# Patient Record
Sex: Female | Born: 1980 | Race: Black or African American | Hispanic: No | Marital: Married | Smoking: Former smoker
Health system: Southern US, Community
[De-identification: ages and names within clinical notes are randomized; demographics above are authoritative.]

## PROBLEM LIST (undated history)

## (undated) DIAGNOSIS — J40 Bronchitis, not specified as acute or chronic: Secondary | ICD-10-CM

## (undated) DIAGNOSIS — K219 Gastro-esophageal reflux disease without esophagitis: Secondary | ICD-10-CM

## (undated) DIAGNOSIS — E669 Obesity, unspecified: Secondary | ICD-10-CM

## (undated) DIAGNOSIS — N63 Unspecified lump in unspecified breast: Secondary | ICD-10-CM

## (undated) DIAGNOSIS — I10 Essential (primary) hypertension: Secondary | ICD-10-CM

## (undated) HISTORY — DX: Unspecified lump in unspecified breast: N63.0

## (undated) HISTORY — DX: Obesity, unspecified: E66.9

---

## 2004-08-27 ENCOUNTER — Emergency Department: Payer: Self-pay | Admitting: Emergency Medicine

## 2006-02-17 ENCOUNTER — Observation Stay: Payer: Self-pay | Admitting: Unknown Physician Specialty

## 2006-03-10 ENCOUNTER — Observation Stay: Payer: Self-pay

## 2006-03-18 ENCOUNTER — Observation Stay: Payer: Self-pay | Admitting: Obstetrics & Gynecology

## 2006-03-19 ENCOUNTER — Inpatient Hospital Stay: Payer: Self-pay

## 2008-07-24 ENCOUNTER — Emergency Department: Payer: Self-pay | Admitting: Emergency Medicine

## 2008-09-21 ENCOUNTER — Emergency Department: Payer: Self-pay | Admitting: Emergency Medicine

## 2008-10-23 ENCOUNTER — Emergency Department: Payer: Self-pay | Admitting: Emergency Medicine

## 2009-02-09 ENCOUNTER — Observation Stay: Payer: Self-pay | Admitting: Unknown Physician Specialty

## 2009-02-14 ENCOUNTER — Observation Stay: Payer: Self-pay | Admitting: Obstetrics & Gynecology

## 2009-02-20 ENCOUNTER — Inpatient Hospital Stay: Payer: Self-pay

## 2010-01-25 ENCOUNTER — Emergency Department: Payer: Self-pay | Admitting: Emergency Medicine

## 2011-01-05 ENCOUNTER — Emergency Department: Payer: Self-pay | Admitting: Emergency Medicine

## 2013-06-30 ENCOUNTER — Emergency Department: Payer: Self-pay | Admitting: Emergency Medicine

## 2013-06-30 LAB — URINALYSIS, COMPLETE
Bilirubin,UR: NEGATIVE
Blood: NEGATIVE
Nitrite: NEGATIVE
Ph: 7 (ref 4.5–8.0)
RBC,UR: 4 /HPF (ref 0–5)
Squamous Epithelial: 7

## 2013-06-30 LAB — RAPID INFLUENZA A&B ANTIGENS

## 2015-06-13 ENCOUNTER — Ambulatory Visit: Payer: Self-pay | Admitting: Podiatry

## 2016-06-22 ENCOUNTER — Encounter: Payer: Self-pay | Admitting: Surgery

## 2016-06-22 ENCOUNTER — Ambulatory Visit (INDEPENDENT_AMBULATORY_CARE_PROVIDER_SITE_OTHER): Payer: 59 | Admitting: Surgery

## 2016-06-22 VITALS — BP 153/93 | HR 87 | Temp 99.1°F | Ht 67.0 in | Wt 240.0 lb

## 2016-06-22 DIAGNOSIS — N63 Unspecified lump in unspecified breast: Secondary | ICD-10-CM | POA: Diagnosis not present

## 2016-06-22 DIAGNOSIS — N644 Mastodynia: Secondary | ICD-10-CM | POA: Diagnosis not present

## 2016-06-22 NOTE — Progress Notes (Signed)
Surgical Consultation  06/22/2016  Danielle Romero is an 35 y.o. female.   CC: Left Breast lump  HPI: This a patient with a painful left breast lump. She does not do regular self exams but believes it is been there over a year and has been increasing in size. It has never drained but she has had some expressible milky fluid from one of her ducts on her left breast. She has never had a biopsy before and has no other problems. Of note however her mother has breast cancer and is being treated currently  GYN History: G4 P4 Ab0  Family Breast Cancer History: Mother  No past medical history past surgical history includes a C-section only.  No past medical history on file.  No past surgical history on file.  No family history on file.  Social History:  has no tobacco, alcohol, and drug history on file. She works at [employer] smokes 4 cigarettes per day and drinks alcohol on weekends  Allergies: Not on File  Medications reviewed.   Review of Systems:   Review of Systems  Constitutional: Negative.   HENT: Negative.   Eyes: Negative.   Respiratory: Negative.   Cardiovascular: Negative.   Gastrointestinal: Negative.   Genitourinary: Negative.   Musculoskeletal: Negative.   Skin: Negative.   Neurological: Negative.   Endo/Heme/Allergies: Negative.   Psychiatric/Behavioral: Negative.      Physical Exam:  There were no vitals taken for this visit.  Physical Exam  Constitutional: She is oriented to person, place, and time and well-developed, well-nourished, and in no distress. No distress.  HENT:  Head: Normocephalic and atraumatic.  Eyes: Pupils are equal, round, and reactive to light. Right eye exhibits no discharge. Left eye exhibits no discharge. No scleral icterus.  Neck: Normal range of motion.  Cardiovascular: Normal rate, regular rhythm and normal heart sounds.   Pulmonary/Chest: Effort normal and breath sounds normal. No respiratory distress. She has no  wheezes. She has no rales. Right breast exhibits no inverted nipple, no mass, no nipple discharge, no skin change and no tenderness. Left breast exhibits mass. Left breast exhibits no inverted nipple, no nipple discharge, no skin change and no tenderness.  Abdominal: Soft. There is no tenderness.  Musculoskeletal: Normal range of motion. She exhibits no edema.  Lymphadenopathy:    She has no cervical adenopathy.  Neurological: She is alert and oriented to person, place, and time.  Skin: Skin is warm and dry. No erythema.  Psychiatric: Mood and affect normal.  Vitals reviewed.   Breast Exam: No mass in the right breast no axillary adenopathy  Left breast with 3 mm raised whitish colored mass at the 3:00 position of the areola within the areola. It is nontender nonerythematous no expressible material. No expressible nipple discharge. No other masses and no axillary adenopathy.    No results found for this or any previous visit (from the past 48 hour(s)). No results found.  Pathology: None  Assessment/Plan:  Patient is not had a mammogram. We'll obtain a mammogram especially with her family history of breast cancer. 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 Once the mammogram was complete and there are no other concerns I would recommend excisional biopsy which could be performed her surgery center under local anesthetic. The rationale for this approach is been discussed she understood and agreed to proceed we'll see her back after the mammogram  Florene Glen, MD, FACS

## 2016-06-22 NOTE — Patient Instructions (Addendum)
Your mammogram and ultrasound appointment will be on 07/02/2016 at 1:40 PM at [facility].  We will see you back in th office afterwards to discuss results.

## 2016-06-24 ENCOUNTER — Telehealth: Payer: Self-pay

## 2016-06-24 NOTE — Addendum Note (Signed)
Addended by: Wayna Chalet on: 06/24/2016 09:53 AM   Modules accepted: Orders

## 2016-06-24 NOTE — Telephone Encounter (Signed)
Called patient and had to leave a voicemail to return my call.  Patient has an appointment at [facility] on 07/02/2016 at 1:40 PM. If this time doesn't work for her, she could call [phone].

## 2016-07-02 ENCOUNTER — Ambulatory Visit
Admission: RE | Admit: 2016-07-02 | Discharge: 2016-07-02 | Disposition: A | Discharge status: Home / Self Care | Payer: 59 | Source: Ambulatory Visit | Attending: Surgery | Admitting: Surgery

## 2016-07-02 DIAGNOSIS — R921 Mammographic calcification found on diagnostic imaging of breast: Secondary | ICD-10-CM | POA: Insufficient documentation

## 2016-07-02 DIAGNOSIS — N6002 Solitary cyst of left breast: Secondary | ICD-10-CM | POA: Insufficient documentation

## 2016-07-02 DIAGNOSIS — N6320 Unspecified lump in the left breast, unspecified quadrant: Secondary | ICD-10-CM | POA: Insufficient documentation

## 2016-07-02 DIAGNOSIS — N644 Mastodynia: Secondary | ICD-10-CM | POA: Diagnosis present

## 2016-07-02 DIAGNOSIS — N63 Unspecified lump in unspecified breast: Secondary | ICD-10-CM

## 2016-07-02 DIAGNOSIS — N6310 Unspecified lump in the right breast, unspecified quadrant: Secondary | ICD-10-CM | POA: Insufficient documentation

## 2016-07-07 ENCOUNTER — Other Ambulatory Visit: Payer: Self-pay | Admitting: Surgery

## 2016-07-07 DIAGNOSIS — R928 Other abnormal and inconclusive findings on diagnostic imaging of breast: Secondary | ICD-10-CM

## 2016-07-07 DIAGNOSIS — R921 Mammographic calcification found on diagnostic imaging of breast: Secondary | ICD-10-CM

## 2016-07-22 ENCOUNTER — Ambulatory Visit
Admission: RE | Admit: 2016-07-22 | Discharge: 2016-07-22 | Disposition: A | Discharge status: Home / Self Care | Payer: 59 | Source: Ambulatory Visit | Attending: Surgery | Admitting: Surgery

## 2016-07-22 DIAGNOSIS — R921 Mammographic calcification found on diagnostic imaging of breast: Secondary | ICD-10-CM | POA: Diagnosis not present

## 2016-07-22 DIAGNOSIS — R928 Other abnormal and inconclusive findings on diagnostic imaging of breast: Secondary | ICD-10-CM | POA: Diagnosis present

## 2016-07-23 LAB — SURGICAL PATHOLOGY

## 2016-07-24 ENCOUNTER — Telehealth: Payer: Self-pay

## 2016-07-24 NOTE — Telephone Encounter (Signed)
Patient will come in on 07/27/2016 to see Dr. Burt Knack to go over her biopsy.

## 2016-07-27 ENCOUNTER — Ambulatory Visit (INDEPENDENT_AMBULATORY_CARE_PROVIDER_SITE_OTHER): Payer: 59 | Admitting: Surgery

## 2016-07-27 ENCOUNTER — Encounter: Payer: Self-pay | Admitting: Surgery

## 2016-07-27 VITALS — BP 152/100 | HR 111 | Temp 98.4°F | Ht 67.0 in | Wt 239.6 lb

## 2016-07-27 DIAGNOSIS — N63 Unspecified lump in unspecified breast: Secondary | ICD-10-CM

## 2016-07-27 DIAGNOSIS — R928 Other abnormal and inconclusive findings on diagnostic imaging of breast: Secondary | ICD-10-CM

## 2016-07-27 NOTE — Progress Notes (Signed)
Outpatient Surgical Follow Up  07/27/2016  Danielle Romero is an 36 y.o. female.   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 Excision is indicated.  She has strong family history of breast cancer  Past Medical History:  Diagnosis Date  . Breast mass     Past Surgical History:  Procedure Laterality Date  . NO PAST SURGERIES     verified per patient 06/22/16    Family History  Problem Relation Age of Onset  . Hypertension Mother   . Breast cancer Mother 20  . Kidney disease Mother   . Cirrhosis Father   . Alcohol abuse Father     Social History:  reports that she has been smoking Cigarettes.  She has been smoking about 0.25 packs per day. She has never used smokeless tobacco. She reports that she drinks alcohol. She reports that she does not use drugs.  Allergies: No Known Allergies  Medications reviewed.   Review of Systems:   Review of Systems  Constitutional: Negative.   HENT: Negative.   Eyes: Negative.   Respiratory: Negative.   Cardiovascular: Negative.   Gastrointestinal: Negative.   Genitourinary: Negative.   Musculoskeletal: Negative.   Skin: Negative.   Neurological: Negative.   Endo/Heme/Allergies: Negative.   Psychiatric/Behavioral: Negative.      Physical Exam:  There were no vitals taken for this visit.  Physical Exam  Constitutional: She is oriented to person, place, and time and well-developed, well-nourished, and in no distress. No distress.  HENT:  Head: Normocephalic and atraumatic.  Eyes: Pupils are equal, round, and reactive to light. Right eye exhibits no discharge. Left eye exhibits no discharge. No scleral icterus.  Neck: Normal range of motion.  Cardiovascular: Normal  rate, regular rhythm and normal heart sounds.   Pulmonary/Chest: Effort normal and breath sounds normal. No respiratory distress. She has no wheezes. She has no rales.  Abdominal: Soft. She exhibits no distension. There is no tenderness. There is no rebound.  Musculoskeletal: Normal range of motion. She exhibits no edema, tenderness or deformity.  Lymphadenopathy:    She has no cervical adenopathy.  Neurological: She is alert and oriented to person, place, and time.  Skin: Skin is warm and dry. She is not diaphoretic.  Psychiatric: Mood, affect and judgment normal.  Vitals reviewed.   Breast exam: No mass in the right breast no axillary adenopathy  Left breast in the 9:00 position is a 3 mm raised area likely representing a molars gland cyst in the areola In the upper outer quadrant near a tattoo is an area of Steri-Strips delineating the core needle biopsy site  No results found for this or any previous visit (from the past 48 hour(s)). No results found.  Assessment/Plan:  Mammograms and pathology been personally reviewed Abnormal left mammogram with atypia: Requires complete excision with needle localization technique.  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 Discussed excision at the time of her needle localization breast biopsy.  I discussed the rationale for biopsying both of these areas the likelihood that the molars gland cyst is benign was discussed. I also discussed the likelihood that the core needle biopsy site with atypia represents a benign condition as well. However with the patient's family history in her mother of having  breast cancer she dictates excisional biopsy and both of these could be done at the same time. I discussed risks of bleeding infection recurrence cosmetic deformity including hematoma and seroma and the need for further surgery or treatment she understood and agreed to proceed  Florene Glen, MD, FACS

## 2016-07-27 NOTE — Patient Instructions (Signed)
Angie will call you with the date of your surgery. Please see [facility] pre-care sheet for surgery information. Please call our office if you have any questions or concerns.

## 2016-08-03 ENCOUNTER — Telehealth: Payer: Self-pay | Admitting: Surgery

## 2016-08-03 ENCOUNTER — Other Ambulatory Visit: Payer: Self-pay

## 2016-08-03 DIAGNOSIS — N6320 Unspecified lump in the left breast, unspecified quadrant: Secondary | ICD-10-CM

## 2016-08-03 NOTE — Telephone Encounter (Signed)
Pt advised of pre op date/time and sx date. Sx: 08/13/16 with Dr Beryle Beams lumpectomy with Needle localization.  Pre op: 08/07/16 between 1-5:00 phone.   Patient made aware to arrive at 7:50am at [facility] the day of surgery.

## 2016-08-04 ENCOUNTER — Other Ambulatory Visit: Payer: Self-pay | Admitting: Surgery

## 2016-08-04 DIAGNOSIS — N6320 Unspecified lump in the left breast, unspecified quadrant: Secondary | ICD-10-CM

## 2016-08-07 ENCOUNTER — Encounter
Admission: RE | Admit: 2016-08-07 | Discharge: 2016-08-07 | Disposition: A | Discharge status: Home / Self Care | Payer: 59 | Source: Ambulatory Visit | Attending: Surgery | Admitting: Surgery

## 2016-08-07 HISTORY — DX: Gastro-esophageal reflux disease without esophagitis: K21.9

## 2016-08-07 NOTE — Patient Instructions (Signed)
  Your procedure is scheduled on: 08-13-16 Report to [place] @ 7:50 AM   Remember: Instructions that are not followed completely may result in serious medical risk, up to and including death, or upon the discretion of your surgeon and anesthesiologist your surgery may need to be rescheduled.    _x___ 1. Do not eat food or drink liquids after midnight. No gum chewing or hard candies.     __x__ 2. No Alcohol for 24 hours before or after surgery.   __x__3. No Smoking for 24 prior to surgery.   ____  4. Bring all medications with you on the day of surgery if instructed.    __x__ 5. Notify your doctor if there is any change in your medical condition     (cold, fever, infections).     Do not wear jewelry, make-up, hairpins, clips or nail polish.  Do not wear lotions, powders, or perfumes. You may wear deodorant.  Do not shave 48 hours prior to surgery. Men may shave face and neck.  Do not bring valuables to the hospital.    [facility] is not responsible for any belongings or valuables.               Contacts, dentures or bridgework may not be worn into surgery.  Leave your suitcase in the car. After surgery it may be brought to your room.  For patients admitted to the hospital, discharge time is determined by your treatment team.   Patients discharged the day of surgery will not be allowed to drive home.  You will need someone to drive you home and stay with you the night of your procedure.    Please read over the following fact sheets that you were given:   [facility] Preparing for Surgery and or MRSA Information   ____ Take these medicines the morning of surgery with A SIP OF WATER:    1.NONE   2.  3.  4.  5.  6.  ____Fleets enema or Magnesium Citrate as directed.   ____ Use CHG Soap or sage wipes as directed on instruction sheet   ____ Use inhalers on the day of surgery and bring to hospital day of surgery  ____ Stop metformin 2 days prior to  surgery    ____ Take 1/2 of usual insulin dose the night before surgery and none on the morning of  surgery.   ____ Stop Aspirin, Coumadin, Pllavix ,Eliquis, Effient, or Pradaxa  x__ Stop Anti-inflammatories such as Advil, Aleve, Ibuprofen, Motrin, Naproxen,          Naprosyn, Goodies powders or aspirin products NOW-Ok to take Tylenol.   ____ Stop supplements until after surgery.    ____ Bring C-Pap to the hospital.

## 2016-08-11 ENCOUNTER — Other Ambulatory Visit
Admission: RE | Admit: 2016-08-11 | Discharge: 2016-08-11 | Disposition: A | Discharge status: Home / Self Care | Payer: 59 | Source: Ambulatory Visit | Attending: Surgery | Admitting: Surgery

## 2016-08-11 DIAGNOSIS — R928 Other abnormal and inconclusive findings on diagnostic imaging of breast: Secondary | ICD-10-CM | POA: Insufficient documentation

## 2016-08-11 LAB — BASIC METABOLIC PANEL
Anion gap: 5 (ref 5–15)
BUN: 13 mg/dL (ref 6–20)
CALCIUM: 9 mg/dL (ref 8.9–10.3)
CO2: 26 mmol/L (ref 22–32)
CREATININE: 0.6 mg/dL (ref 0.44–1.00)
Chloride: 106 mmol/L (ref 101–111)
GFR calc Af Amer: >60 mL/min (ref >60)
GLUCOSE: 103 mg/dL — AB (ref 65–99)
POTASSIUM: 3.8 mmol/L (ref 3.5–5.1)
Sodium: 137 mmol/L (ref 135–145)

## 2016-08-11 LAB — CBC WITH DIFFERENTIAL/PLATELET
BASOS PCT: 1 %
Basophils Absolute: 0.1 10*3/uL (ref 0–0.1)
EOS ABS: 0.1 10*3/uL (ref 0–0.7)
EOS PCT: 1 %
HCT: 37.2 % (ref 35.0–47.0)
Hemoglobin: 12.5 g/dL (ref 12.0–16.0)
Lymphocytes Relative: 25 %
Lymphs Abs: 2.4 10*3/uL (ref 1.0–3.6)
MCH: 27.5 pg (ref 26.0–34.0)
MCHC: 33.6 g/dL (ref 32.0–36.0)
MCV: 81.7 fL (ref 80.0–100.0)
MONO ABS: 0.6 10*3/uL (ref 0.2–0.9)
Monocytes Relative: 7 %
Neutro Abs: 6.3 10*3/uL (ref 1.4–6.5)
Neutrophils Relative %: 66 %
Platelets: 248 10*3/uL (ref 150–440)
RBC: 4.55 MIL/uL (ref 3.80–5.20)
RDW: 13.6 % (ref 11.5–14.5)
WBC: 9.5 10*3/uL (ref 3.6–11.0)

## 2016-08-13 ENCOUNTER — Telehealth: Payer: Self-pay

## 2016-08-13 ENCOUNTER — Ambulatory Visit: Payer: 59 | Admitting: Anesthesiology

## 2016-08-13 ENCOUNTER — Encounter
Admission: RE | Disposition: A | Discharge status: Home / Self Care | Payer: Self-pay | Source: Ambulatory Visit | Attending: Surgery

## 2016-08-13 ENCOUNTER — Ambulatory Visit
Admission: RE | Admit: 2016-08-13 | Discharge: 2016-08-13 | Disposition: A | Discharge status: Home / Self Care | Payer: 59 | Source: Ambulatory Visit | Attending: Surgery | Admitting: Surgery

## 2016-08-13 ENCOUNTER — Encounter: Payer: Self-pay | Admitting: *Deleted

## 2016-08-13 DIAGNOSIS — D242 Benign neoplasm of left breast: Secondary | ICD-10-CM | POA: Diagnosis not present

## 2016-08-13 DIAGNOSIS — Z793 Long term (current) use of hormonal contraceptives: Secondary | ICD-10-CM | POA: Insufficient documentation

## 2016-08-13 DIAGNOSIS — N6320 Unspecified lump in the left breast, unspecified quadrant: Secondary | ICD-10-CM

## 2016-08-13 DIAGNOSIS — L720 Epidermal cyst: Secondary | ICD-10-CM | POA: Insufficient documentation

## 2016-08-13 DIAGNOSIS — Z803 Family history of malignant neoplasm of breast: Secondary | ICD-10-CM | POA: Insufficient documentation

## 2016-08-13 DIAGNOSIS — F17200 Nicotine dependence, unspecified, uncomplicated: Secondary | ICD-10-CM | POA: Diagnosis not present

## 2016-08-13 DIAGNOSIS — R928 Other abnormal and inconclusive findings on diagnostic imaging of breast: Secondary | ICD-10-CM | POA: Insufficient documentation

## 2016-08-13 HISTORY — PX: BREAST EXCISIONAL BIOPSY: SUR124

## 2016-08-13 HISTORY — PX: BREAST BIOPSY: SHX20

## 2016-08-13 LAB — POCT PREGNANCY, URINE: Preg Test, Ur: NEGATIVE

## 2016-08-13 SURGERY — BREAST BIOPSY WITH NEEDLE LOCALIZATION
Anesthesia: General | Laterality: Left | Wound class: Clean

## 2016-08-13 MED ORDER — MIDAZOLAM HCL 2 MG/2ML IJ SOLN
INTRAMUSCULAR | Status: AC
  Filled 2016-08-13: qty 2

## 2016-08-13 MED ORDER — FENTANYL CITRATE (PF) 100 MCG/2ML IJ SOLN
INTRAMUSCULAR | Status: DC | PRN
  Administered 2016-08-13 (×2): 50 ug via INTRAVENOUS

## 2016-08-13 MED ORDER — FAMOTIDINE 20 MG PO TABS
ORAL_TABLET | ORAL | Status: AC
  Filled 2016-08-13: qty 1

## 2016-08-13 MED ORDER — LACTATED RINGERS IV SOLN
INTRAVENOUS | Status: DC
  Administered 2016-08-13: 09:00:00 via INTRAVENOUS

## 2016-08-13 MED ORDER — PROPOFOL 10 MG/ML IV BOLUS
INTRAVENOUS | Status: DC | PRN
  Administered 2016-08-13: 200 mg via INTRAVENOUS

## 2016-08-13 MED ORDER — LIDOCAINE HCL (CARDIAC) 20 MG/ML IV SOLN
INTRAVENOUS | Status: DC | PRN
  Administered 2016-08-13: 100 mg via INTRAVENOUS

## 2016-08-13 MED ORDER — HYDROCODONE-ACETAMINOPHEN 5-325 MG PO TABS
ORAL_TABLET | ORAL | Status: AC
  Filled 2016-08-13: qty 1

## 2016-08-13 MED ORDER — BUPIVACAINE-EPINEPHRINE 0.25% -1:200000 IJ SOLN
INTRAMUSCULAR | Status: DC | PRN
  Administered 2016-08-13: 30 mL

## 2016-08-13 MED ORDER — HYDROCODONE-ACETAMINOPHEN 5-325 MG PO TABS
1.0000 | ORAL_TABLET | ORAL | Status: DC | PRN
  Administered 2016-08-13: 1 via ORAL

## 2016-08-13 MED ORDER — CHLORHEXIDINE GLUCONATE CLOTH 2 % EX PADS: 6.0000 | MEDICATED_PAD | Freq: Once | CUTANEOUS | Status: DC

## 2016-08-13 MED ORDER — DEXAMETHASONE SODIUM PHOSPHATE 10 MG/ML IJ SOLN
INTRAMUSCULAR | Status: DC | PRN
  Administered 2016-08-13: 10 mg via INTRAVENOUS

## 2016-08-13 MED ORDER — PROPOFOL 10 MG/ML IV BOLUS
INTRAVENOUS | Status: AC
  Filled 2016-08-13: qty 20

## 2016-08-13 MED ORDER — FENTANYL CITRATE (PF) 100 MCG/2ML IJ SOLN
INTRAMUSCULAR | Status: AC
  Filled 2016-08-13: qty 2

## 2016-08-13 MED ORDER — MIDAZOLAM HCL 2 MG/2ML IJ SOLN
INTRAMUSCULAR | Status: DC | PRN
  Administered 2016-08-13: 2 mg via INTRAVENOUS

## 2016-08-13 MED ORDER — ONDANSETRON HCL 4 MG/2ML IJ SOLN
INTRAMUSCULAR | Status: AC
  Filled 2016-08-13: qty 2

## 2016-08-13 MED ORDER — PHENYLEPHRINE HCL 10 MG/ML IJ SOLN
INTRAMUSCULAR | Status: DC | PRN
  Administered 2016-08-13 (×5): 100 ug via INTRAVENOUS

## 2016-08-13 MED ORDER — HYDROCODONE-ACETAMINOPHEN 5-300 MG PO TABS: 1.0000 | ORAL_TABLET | ORAL | 0 refills | Status: DC | PRN

## 2016-08-13 MED ORDER — ONDANSETRON HCL 4 MG/2ML IJ SOLN
INTRAMUSCULAR | Status: DC | PRN
  Administered 2016-08-13: 4 mg via INTRAVENOUS

## 2016-08-13 MED ORDER — PHENYLEPHRINE HCL 10 MG/ML IJ SOLN
INTRAMUSCULAR | Status: AC
  Filled 2016-08-13: qty 1

## 2016-08-13 MED ORDER — HEPARIN SODIUM (PORCINE) 5000 UNIT/ML IJ SOLN
5000.0000 [IU] | Freq: Once | INTRAMUSCULAR | Status: AC
  Administered 2016-08-13: 5000 [IU] via SUBCUTANEOUS

## 2016-08-13 MED ORDER — LACTATED RINGERS IV SOLN
INTRAVENOUS | Status: DC | PRN
  Administered 2016-08-13: 10:00:00 via INTRAVENOUS

## 2016-08-13 MED ORDER — DEXAMETHASONE SODIUM PHOSPHATE 10 MG/ML IJ SOLN
INTRAMUSCULAR | Status: AC
  Filled 2016-08-13: qty 1

## 2016-08-13 MED ORDER — PROMETHAZINE HCL 25 MG/ML IJ SOLN: 6.2500 mg | INTRAMUSCULAR | Status: DC | PRN

## 2016-08-13 MED ORDER — LIDOCAINE HCL (PF) 2 % IJ SOLN
INTRAMUSCULAR | Status: AC
  Filled 2016-08-13: qty 2

## 2016-08-13 MED ORDER — HEPARIN SODIUM (PORCINE) 5000 UNIT/ML IJ SOLN
INTRAMUSCULAR | Status: AC
  Filled 2016-08-13: qty 1

## 2016-08-13 MED ORDER — ROCURONIUM BROMIDE 50 MG/5ML IV SOSY
PREFILLED_SYRINGE | INTRAVENOUS | Status: AC
  Filled 2016-08-13: qty 5

## 2016-08-13 MED ORDER — FENTANYL CITRATE (PF) 100 MCG/2ML IJ SOLN: 25.0000 ug | INTRAMUSCULAR | Status: DC | PRN

## 2016-08-13 MED ORDER — FAMOTIDINE 20 MG PO TABS: 20.0000 mg | ORAL_TABLET | Freq: Once | ORAL | Status: DC

## 2016-08-13 MED ORDER — LIDOCAINE-EPINEPHRINE 1 %-1:100000 IJ SOLN: INTRAMUSCULAR | Status: DC | PRN

## 2016-08-13 SURGICAL SUPPLY — 29 items
ADHESIVE MASTISOL STRL (MISCELLANEOUS) ×3 IMPLANT
BLADE SURG 15 STRL LF DISP TIS (BLADE) ×1 IMPLANT
BLADE SURG 15 STRL SS (BLADE) ×2
CANISTER SUCT 1200ML W/VALVE (MISCELLANEOUS) ×3 IMPLANT
CHLORAPREP W/TINT 26ML (MISCELLANEOUS) ×3 IMPLANT
CLOSURE WOUND 1/2 X4 (GAUZE/BANDAGES/DRESSINGS) ×1
CNTNR SPEC 2.5X3XGRAD LEK (MISCELLANEOUS) ×1
CONT SPEC 4OZ STER OR WHT (MISCELLANEOUS) ×2
CONTAINER SPEC 2.5X3XGRAD LEK (MISCELLANEOUS) ×1 IMPLANT
DEVICE DUBIN SPECIMEN MAMMOGRA (MISCELLANEOUS) ×3 IMPLANT
DRAPE LAPAROTOMY TRNSV 106X77 (MISCELLANEOUS) ×3 IMPLANT
ELECT REM PT RETURN 9FT ADLT (ELECTROSURGICAL) ×3
ELECTRODE REM PT RTRN 9FT ADLT (ELECTROSURGICAL) ×1 IMPLANT
GAUZE FLUFF 18X24 1PLY STRL (GAUZE/BANDAGES/DRESSINGS) ×3 IMPLANT
GLOVE BIO SURGEON STRL SZ8 (GLOVE) ×3 IMPLANT
GOWN STRL REUS W/ TWL LRG LVL3 (GOWN DISPOSABLE) ×2 IMPLANT
GOWN STRL REUS W/TWL LRG LVL3 (GOWN DISPOSABLE) ×4
KIT RM TURNOVER STRD PROC AR (KITS) ×3 IMPLANT
LABEL OR SOLS (LABEL) ×3 IMPLANT
NDL SAFETY 22GX1.5 (NEEDLE) ×3 IMPLANT
PACK BASIN MINOR ARMC (MISCELLANEOUS) ×3 IMPLANT
STRIP CLOSURE SKIN 1/2X4 (GAUZE/BANDAGES/DRESSINGS) ×2 IMPLANT
SUT MNCRL AB 4-0 PS2 18 (SUTURE) ×3 IMPLANT
SUT MON AB 5-0 P3 18 (SUTURE) ×3 IMPLANT
SUT VIC AB 3-0 SH 27 (SUTURE) ×2
SUT VIC AB 3-0 SH 27X BRD (SUTURE) ×1 IMPLANT
SYRINGE 10CC LL (SYRINGE) ×3 IMPLANT
TAPE MICROFOAM 4IN (TAPE) ×3 IMPLANT
WATER STERILE IRR 1000ML POUR (IV SOLUTION) ×3 IMPLANT

## 2016-08-13 NOTE — Anesthesia Procedure Notes (Signed)
Procedure Name: LMA Insertion Date/Time: 08/13/2016 9:57 AM Performed by: Darlyne Russian Pre-anesthesia Checklist: Patient identified, Emergency Drugs available, Suction available, Patient being monitored and Timeout performed Patient Re-evaluated:Patient Re-evaluated prior to inductionOxygen Delivery Method: Circle system utilized Preoxygenation: Pre-oxygenation with 100% oxygen Intubation Type: IV induction Ventilation: Mask ventilation without difficulty LMA: LMA inserted LMA Size: 4.0 Number of attempts: 1 Placement Confirmation: positive ETCO2 and breath sounds checked- equal and bilateral Tube secured with: Tape

## 2016-08-13 NOTE — Discharge Instructions (Signed)
Remove dressing in 24 hours. °May shower in 24 hours. °Leave paper strips in place. °Resume all home medications. °Follow-up with Dr. Cooper in 10 days. ° °AMBULATORY SURGERY  °DISCHARGE INSTRUCTIONS ° ° °1) The drugs that you were given will stay in your system until tomorrow so for the next 24 hours you should not: ° °A) Drive an automobile °B) Make any legal decisions °C) Drink any alcoholic beverage ° ° °2) You may resume regular meals tomorrow.  Today it is better to start with liquids and gradually work up to solid foods. ° °You may eat anything you prefer, but it is better to start with liquids, then soup and crackers, and gradually work up to solid foods. ° ° °3) Please notify your doctor immediately if you have any unusual bleeding, trouble breathing, redness and pain at the surgery site, drainage, fever, or pain not relieved by medication. ° ° ° °4) Additional Instructions: ° ° ° ° ° ° ° °Please contact your physician with any problems or Same Day Surgery at [phone], Monday through Friday 6 am to 4 pm, or [facility] [place] [facility] number at [phone]. °

## 2016-08-13 NOTE — Transfer of Care (Signed)
Immediate Anesthesia Transfer of Care Note  Patient: Danielle Romero  Procedure(s) Performed: Procedure(s): left BREAST BIOPSY WITH NEEDLE LOCALIZATION left Moeller's gland excision (Left)  Patient Location: PACU  Anesthesia Type:General  Level of Consciousness: awake, alert , oriented and patient cooperative  Airway & Oxygen Therapy: Patient Spontanous Breathing and Patient connected to nasal cannula oxygen  Post-op Assessment: Report given to RN and Post -op Vital signs reviewed and stable  Post vital signs: Reviewed and stable  Last Vitals:  Vitals:   08/13/16 0835 08/13/16 1105  BP: 115/84 110/78  Pulse: 87 84  Resp: 16 19  Temp: 36.4 C 36.4 C    Last Pain:  Vitals:   08/13/16 1105  TempSrc: Tympanic         Complications: No apparent anesthesia complications

## 2016-08-13 NOTE — Op Note (Signed)
  Pre-operative Diagnosis: #1 left nipple mass #2 left mammographic abnormality    Post-operative Diagnosis: Same  Procedure: #1 excision of left nipple mass #2 left needle localization breast biopsy   Surgeon: Jerrol Banana. Burt Knack, MD FACS  Anesthesia: Gen. with LMA  Procedure: Left breast biopsy, needle directed, ; left nipple mass excision Procedure Details  The patient was seen again in the Holding Room. The benefits, complications, treatment options, and expected outcomes were discussed with the patient. The risks of bleeding, infection, recurrence of symptoms, failure to resolve symptoms, hematoma, seroma, open wound, cosmetic deformity, and the need for further surgery were discussed.  The patient was taken to Operating Room, identified as Danielle Romero and the procedure verified.  A Time Out was held and the above information confirmed.  Prior to the induction of general anesthesia, antibiotic prophylaxis was administered. VTE prophylaxis was in place. Appropriate anesthesia was then administered and tolerated well. The chest was prepped with Chloraprep and draped in the sterile fashion. The patient was positioned in the supine position.   Local anesthetic was infiltrated into the nipple area at the 9:00 position where a visible and palpable nodule was present in the nipple skin. A lenticular shaped incision was performed to encompass this area and it was elevated with sharp dissection and sent off for examination labeled left nipple mass. Suspect moeller's  gland cyst.  Hemostasis was with the minimal use of electrocautery and then deep sutures of 3-0 Vicryl placed followed by 5-0 Monocryl. Steri-Strips and Mastisol were placed as well.  Attention was turned to the needle localization site. The needle had been placed adjacent to and inside the straight line of a tattoo. The previous core needle biopsy site was remote to this by approximately 2 cm. Because of the location of the needle  localized wire placement adjacent to an essentially within the very edge of a tattoo a radially placed incision was performed.  Dissection around the needle to perform a excisional biopsy with adequate margins was performed. This was done with electrocautery and sharp dissection. Hemostasis was with electrocautery. Additional Marcaine was infiltrated into the skin and subcutaneous tissues of the cavity. Once assuring that hemostasis was adequate and checked multiple times the wound was closed with interrupted 3-0 Vicryl followed by 4-0 subcuticular Monocryl sutures. There was no sign of residual mass within the cavity. A call from Dr. Brigitte Pulse and personal visualization and review of the specimen mammogram showed that the clip or marker was within the specimen adjacent to the wire.   Steri-Strips Mastisol and sterile dressings were placed  Patient was taken to the recovery room in stable condition where a postoperative chest film has been ordered.    Findings: Core needle biopsy marker within the specimen mammogram suspect Moeller's gland cyst of the nipple.  Estimated Blood Loss: Minimal         Drains: None         Specimens: #1 Moeller's gland cyst in formalin; over 2 left breast tissue with needle localization wire and marker for fresh examination and mammography.      Complications: None                Condition: Stable   Danielle Eades E. Burt Knack, MD, FACS

## 2016-08-13 NOTE — Anesthesia Post-op Follow-up Note (Cosign Needed)
Anesthesia QCDR form completed.        

## 2016-08-13 NOTE — Progress Notes (Signed)
Preoperative Review   Patient is met in the preoperative holding area. The history is reviewed in the chart and with the patient. I personally reviewed the options and rationale as well as the risks of this procedure that have been previously discussed with the patient. All questions asked by the patient and/or family were answered to their satisfaction. Pathology and history reviewed as were the films reviewed personally. Patient marked in the preoperative holding area Patient agrees to proceed with this procedure at this time.  Florene Glen M.D. FACS

## 2016-08-13 NOTE — Anesthesia Preprocedure Evaluation (Signed)
Anesthesia Evaluation  Patient identified by MRN, date of birth, ID band Patient awake    Reviewed: Allergy & Precautions, H&P , NPO status , Patient's Chart, lab work & pertinent test results, reviewed documented beta blocker date and time   History of Anesthesia Complications Negative for: history of anesthetic complications  Airway Mallampati: II  TM Distance: >3 FB Neck ROM: full    Dental  (+) Missing   Pulmonary neg shortness of breath, asthma (as a child, still flares ocassionally) , neg sleep apnea, neg recent URI, Current Smoker,           Cardiovascular Exercise Tolerance: Good negative cardio ROS       Neuro/Psych negative neurological ROS  negative psych ROS   GI/Hepatic Neg liver ROS, GERD  ,  Endo/Other  negative endocrine ROS  Renal/GU negative Renal ROS  negative genitourinary   Musculoskeletal   Abdominal   Peds  Hematology negative hematology ROS (+)   Anesthesia Other Findings Past Medical History: No date: Breast mass No date: GERD (gastroesophageal reflux disease)     Comment: NO MEDS   Reproductive/Obstetrics negative OB ROS                             Anesthesia Physical Anesthesia Plan  ASA: II  Anesthesia Plan: General   Post-op Pain Management:    Induction:   Airway Management Planned:   Additional Equipment:   Intra-op Plan:   Post-operative Plan:   Informed Consent: I have reviewed the patients History and Physical, chart, labs and discussed the procedure including the risks, benefits and alternatives for the proposed anesthesia with the patient or authorized representative who has indicated his/her understanding and acceptance.   Dental Advisory Given  Plan Discussed with: Anesthesiologist, CRNA and Surgeon  Anesthesia Plan Comments:         Anesthesia Quick Evaluation

## 2016-08-13 NOTE — Telephone Encounter (Signed)
Disability Paperwork has been received and a $25.00 collection fee has been obtained.  

## 2016-08-14 LAB — SURGICAL PATHOLOGY

## 2016-08-14 NOTE — Anesthesia Postprocedure Evaluation (Signed)
Anesthesia Post Note  Patient: Danielle Romero  Procedure(s) Performed: Procedure(s) (LRB): left BREAST BIOPSY WITH NEEDLE LOCALIZATION left Moeller's gland excision (Left)  Patient location during evaluation: PACU Anesthesia Type: General Level of consciousness: awake and alert Pain management: pain level controlled Vital Signs Assessment: post-procedure vital signs reviewed and stable Respiratory status: spontaneous breathing, nonlabored ventilation, respiratory function stable and patient connected to nasal cannula oxygen Cardiovascular status: blood pressure returned to baseline and stable Postop Assessment: no signs of nausea or vomiting Anesthetic complications: no     Last Vitals:  Vitals:   08/13/16 1146 08/13/16 1229  BP: 128/84 127/88  Pulse: 78 80  Resp: 16 16  Temp:      Last Pain:  Vitals:   08/14/16 0846  TempSrc:   PainSc: 0-No pain                 Martha Clan

## 2016-08-18 NOTE — Telephone Encounter (Signed)
Called patient but had to leave her a voicemail to call me back. I have to ask her some questions about her disability forms.

## 2016-08-18 NOTE — Telephone Encounter (Signed)
Patient called me back and I was able to ask her the questions I needed answered. I also told her that I would finish her disability form until after she is seen so I know what the doctor says. Patient understood and had no further questions.

## 2016-08-21 ENCOUNTER — Ambulatory Visit (INDEPENDENT_AMBULATORY_CARE_PROVIDER_SITE_OTHER): Payer: 59 | Admitting: Surgery

## 2016-08-21 ENCOUNTER — Encounter: Payer: Self-pay | Admitting: Surgery

## 2016-08-21 ENCOUNTER — Telehealth: Payer: Self-pay

## 2016-08-21 VITALS — BP 146/99 | HR 121 | Temp 99.3°F | Ht 67.0 in | Wt 231.8 lb

## 2016-08-21 DIAGNOSIS — R928 Other abnormal and inconclusive findings on diagnostic imaging of breast: Secondary | ICD-10-CM

## 2016-08-21 NOTE — Telephone Encounter (Signed)
Patient's disability form was filled out and faxed. 

## 2016-08-21 NOTE — Telephone Encounter (Signed)
Notified patient of appointments listed below. Patient verbalized understanding.   Mammogram- 02/22/17 @ 11:00 am

## 2016-08-21 NOTE — Progress Notes (Signed)
Outpatient postop visit  08/21/2016  Danielle Romero Primus Bravo is an 36 y.o. female.    Procedure: Left breast needle localization breast biopsy  CC: No complaints  HPI: Patient has no problems at this time she did have a reaction to the tape however.  Medications reviewed.    Physical Exam:  BP (!) 146/99   Pulse (!) 121   Temp 99.3 F (37.4 C) (Oral)   Ht 5\' 7"  (1.702 m)   Wt 231 lb 12.8 oz (105.1 kg)   BMI 36.31 kg/m     PE: Wounds are clean no erythema no drainage resolving rash from tape.    Assessment/Plan:  Patient doing very well pathology is reviewed with the patient there was a molars glands cyst excised from the nipple as well as benign tissue from the breast.  She is doing very well and I recommend a new baseline mammogram on the left side only in 6 months with follow-up at that time  Florene Glen, MD, FACS

## 2016-08-21 NOTE — Patient Instructions (Signed)
We will schedule your 6 month Mammogram and follow up appointment with Dr.Cooper and call you later today. Please call our office if you have any questions or concerns.

## 2017-02-22 ENCOUNTER — Ambulatory Visit: Payer: 59 | Attending: Surgery

## 2017-02-22 ENCOUNTER — Other Ambulatory Visit: Payer: 59

## 2017-02-23 ENCOUNTER — Telehealth: Payer: Self-pay

## 2017-02-23 NOTE — Telephone Encounter (Signed)
Left message for patient to call in regards to rescheduling Mammogram.  Patient no showed for mammogram 02/22/17.   Letter addressed and mailed to patient today.

## 2017-02-24 ENCOUNTER — Telehealth: Payer: Self-pay

## 2017-02-24 NOTE — Telephone Encounter (Signed)
Spoke with patient and she stated she is going to call [facility] to reschedule her Mammogram.  She will call  Our office afterwards so that we can reschedule her appointment to see Dr.Cooper to discuss mammogram and do a breast exam.

## 2017-02-25 ENCOUNTER — Ambulatory Visit: Payer: 59 | Admitting: Surgery

## 2017-02-25 NOTE — Telephone Encounter (Signed)
Left message on patients voice mail to call office regarding rescheduling Mammogram appointment and follow up appointment with Dr.Cooper.

## 2017-03-02 NOTE — Telephone Encounter (Signed)
Left message regarding rescheduling mammogram.  Please see multiple messages below with no response from patient at this time.   Letter was also mailed asking patient to call office to reschedule mammogram.

## 2017-04-01 ENCOUNTER — Emergency Department: Payer: 59

## 2017-04-01 ENCOUNTER — Encounter: Payer: Self-pay | Admitting: Emergency Medicine

## 2017-04-01 ENCOUNTER — Emergency Department
Admission: EM | Admit: 2017-04-01 | Discharge: 2017-04-01 | Disposition: A | Discharge status: Home / Self Care | Payer: 59 | Attending: Emergency Medicine | Admitting: Emergency Medicine

## 2017-04-01 DIAGNOSIS — R102 Pelvic and perineal pain: Secondary | ICD-10-CM | POA: Diagnosis present

## 2017-04-01 DIAGNOSIS — Z79899 Other long term (current) drug therapy: Secondary | ICD-10-CM | POA: Insufficient documentation

## 2017-04-01 DIAGNOSIS — B650 Schistosomiasis due to Schistosoma haematobium [urinary schistosomiasis]: Secondary | ICD-10-CM | POA: Diagnosis not present

## 2017-04-01 DIAGNOSIS — F17210 Nicotine dependence, cigarettes, uncomplicated: Secondary | ICD-10-CM | POA: Diagnosis not present

## 2017-04-01 LAB — URINALYSIS, COMPLETE (UACMP) WITH MICROSCOPIC
Bilirubin Urine: NEGATIVE
GLUCOSE, UA: NEGATIVE mg/dL
HGB URINE DIPSTICK: NEGATIVE
Ketones, ur: NEGATIVE mg/dL
Leukocytes, UA: NEGATIVE
Nitrite: NEGATIVE
PH: 7 (ref 5.0–8.0)
Protein, ur: NEGATIVE mg/dL
SPECIFIC GRAVITY, URINE: 1.009 (ref 1.005–1.030)

## 2017-04-01 LAB — COMPREHENSIVE METABOLIC PANEL
ALK PHOS: 98 U/L (ref 38–126)
ALT: 23 U/L (ref 14–54)
AST: 19 U/L (ref 15–41)
Albumin: 4.1 g/dL (ref 3.5–5.0)
Anion gap: 8 (ref 5–15)
BUN: 14 mg/dL (ref 6–20)
CALCIUM: 9.4 mg/dL (ref 8.9–10.3)
CO2: 25 mmol/L (ref 22–32)
CREATININE: 0.6 mg/dL (ref 0.44–1.00)
Chloride: 107 mmol/L (ref 101–111)
GFR calc non Af Amer: >60 mL/min (ref >60)
Glucose, Bld: 105 mg/dL — ABNORMAL HIGH (ref 65–99)
Potassium: 3.4 mmol/L — ABNORMAL LOW (ref 3.5–5.1)
SODIUM: 140 mmol/L (ref 135–145)
Total Bilirubin: 0.4 mg/dL (ref 0.3–1.2)
Total Protein: 7.8 g/dL (ref 6.5–8.1)

## 2017-04-01 LAB — CBC WITH DIFFERENTIAL/PLATELET
BASOS PCT: 1 %
Basophils Absolute: 0.1 10*3/uL (ref 0–0.1)
EOS ABS: 0.1 10*3/uL (ref 0–0.7)
Eosinophils Relative: 1 %
HEMATOCRIT: 37.3 % (ref 35.0–47.0)
Hemoglobin: 12.9 g/dL (ref 12.0–16.0)
Lymphocytes Relative: 25 %
Lymphs Abs: 2.7 10*3/uL (ref 1.0–3.6)
MCH: 28.5 pg (ref 26.0–34.0)
MCHC: 34.6 g/dL (ref 32.0–36.0)
MCV: 82.3 fL (ref 80.0–100.0)
MONO ABS: 0.9 10*3/uL (ref 0.2–0.9)
MONOS PCT: 8 %
NEUTROS PCT: 65 %
Neutro Abs: 7.3 10*3/uL — ABNORMAL HIGH (ref 1.4–6.5)
Platelets: 284 10*3/uL (ref 150–440)
RBC: 4.53 MIL/uL (ref 3.80–5.20)
RDW: 13.5 % (ref 11.5–14.5)
WBC: 11.1 10*3/uL — ABNORMAL HIGH (ref 3.6–11.0)

## 2017-04-01 MED ORDER — PRAZIQUANTEL 600 MG PO TABS: 1800.0000 mg | ORAL_TABLET | ORAL | 0 refills | Status: AC

## 2017-04-01 MED ORDER — PRAZIQUANTEL 600 MG PO TABS
20.0000 mg/kg | ORAL_TABLET | Freq: Once | ORAL | Status: AC
  Administered 2017-04-01: 2100 mg via ORAL
  Filled 2017-04-01: qty 4

## 2017-04-01 MED ORDER — SODIUM CHLORIDE 0.9 % IV BOLUS (SEPSIS)
1000.0000 mL | Freq: Once | INTRAVENOUS | Status: AC
  Administered 2017-04-01: 1000 mL via INTRAVENOUS

## 2017-04-01 NOTE — ED Notes (Signed)
Pt states they were in the [place] approx 5 months ago. Today urinated and then saw a worm in the toilet and scooped it out. Denies urinary symptoms. Clean urine sample obtained, iv inserted and blood drawn by nurse. Worm sample sent to lab by tech.

## 2017-04-01 NOTE — ED Provider Notes (Signed)
[facility] Emergency Department Provider Note  ____________________________________________  Time seen: Approximately 8:40 PM  I have reviewed the triage vital signs and the nursing notes.   HISTORY  Chief Complaint Foreign Body    HPI Danielle Romero is a 36 y.o. female who presents to emergency department complaining of urinating a permanent home. Patient reports that she went to the restroom, sat up, looked into the toilet bowl and saw a black warm swelling in her urine. Patient denies any previous history of same. She does endorse a recent trip to the [place] and when she saw him in the water approximately 3 months ago. Patient endorses some mild suprapubic pain but denies any dysuria, polyuria, hematuria. No vaginal discharge or bleeding. No vaginal pain. No rectal bleeding, no parasites visualized in stool. No history of parasites. No other complaints at this time. No medications prior to arrival.  Patient presents emergency Department with live worm in a cup.   Past Medical History:  Diagnosis Date  . Breast mass   . GERD (gastroesophageal reflux disease)    NO MEDS    Patient Active Problem List   Diagnosis Date Noted  . Breast mass, left   . Abnormal mammogram of left breast     Past Surgical History:  Procedure Laterality Date  . BREAST BIOPSY Left 08/13/2016   Procedure: left BREAST BIOPSY WITH NEEDLE LOCALIZATION left Moeller's gland excision;  Surgeon: Florene Glen, MD;  Location: [facility] ORS;  Service: General;  Laterality: Left;  . CESAREAN SECTION  2007  . NO PAST SURGERIES     verified per patient 06/22/16    Prior to Admission medications   Medication Sig Start Date End Date Taking? Authorizing Provider  levonorgestrel (MIRENA) 20 MCG/24HR IUD 1 each by Intrauterine route once.    [provider]  praziquantel (BILTRICIDE) 600 MG tablet Take 3 tablets (1,800 mg total) by mouth every 4 (four) hours. 04/01/17 04/02/17   Kenton Fortin, Charline Bills, PA-C    Allergies Patient has no known allergies.  Family History  Problem Relation Age of Onset  . Hypertension Mother   . Breast cancer Mother 10  . Kidney disease Mother   . Cirrhosis Father   . Alcohol abuse Father     Social History Social History  Substance Use Topics  . Smoking status: Current Every Day Smoker    Packs/day: 0.00    Years: 16.00    Types: Cigarettes  . Smokeless tobacco: Never Used     Comment: 2 CIG PER DAY  . Alcohol use Yes     Comment: Occasional     Review of Systems  Constitutional: No fever/chills Cardiovascular: no chest pain. Respiratory: no cough. No SOB. Gastrointestinal: Mild suprapubic abdominal pain..  No nausea, no vomiting.  No diarrhea.  No constipation. Genitourinary: Negative for dysuria. No hematuria. Positive for visualized parasite in urine Musculoskeletal: Negative for musculoskeletal pain. Skin: Negative for rash, abrasions, lacerations, ecchymosis. Neurological: Negative for headaches, focal weakness or numbness. 10-point ROS otherwise negative.  ____________________________________________   PHYSICAL EXAM:  VITAL SIGNS: ED Triage Vitals  Enc Vitals Group     BP 04/01/17 1931 (!) 151/94     Pulse Rate 04/01/17 1931 (!) 116     Resp 04/01/17 1931 20     Temp 04/01/17 1931 98.9 F (37.2 C)     Temp Source 04/01/17 1931 Oral     SpO2 04/01/17 1931 100 %     Weight 04/01/17 1932 238 lb (  108 kg)     Height 04/01/17 1932 5\' 7"  (1.702 m)     Head Circumference --      Peak Flow --      Pain Score --      Pain Loc --      Pain Edu? --      Excl. in [place]? --      Constitutional: Alert and oriented. Well appearing and in no acute distress. Eyes: Conjunctivae are normal. PERRL. EOMI. Head: Atraumatic. ENT:      Ears:       Nose: No congestion/rhinnorhea.      Mouth/Throat: Mucous membranes are moist.  Neck: No stridor.    Cardiovascular: Normal rate, regular rhythm. Normal S1 and S2.   Good peripheral circulation. Respiratory: Normal respiratory effort without tachypnea or retractions. Lungs CTAB. Good air entry to the bases with no decreased or absent breath sounds. Gastrointestinal: Bowel sounds 4 quadrants. Soft To palpation. Patient is mildly tender to palpation in the suprapubic region, no other tenderness to palpation in the abdominal quadrant. No palpable abnormality.. No guarding or rigidity. No palpable masses. No distention. No CVA tenderness. Musculoskeletal: Full range of motion to all extremities. No gross deformities appreciated. Neurologic:  Normal speech and language. No gross focal neurologic deficits are appreciated.  Skin:  Skin is warm, dry and intact. No rash noted. Psychiatric: Mood and affect are normal. Speech and behavior are normal. Patient exhibits appropriate insight and judgement.   ____________________________________________   LABS (all labs ordered are listed, but only abnormal results are displayed)  Labs Reviewed  URINALYSIS, COMPLETE (UACMP) WITH MICROSCOPIC - Abnormal; Notable for the following:       Result Value   Color, Urine STRAW (*)    APPearance CLEAR (*)    Bacteria, UA FEW (*)    Squamous Epithelial / LPF 0-5 (*)    All other components within normal limits  COMPREHENSIVE METABOLIC PANEL - Abnormal; Notable for the following:    Potassium 3.4 (*)    Glucose, Bld 105 (*)    All other components within normal limits  CBC WITH DIFFERENTIAL/PLATELET - Abnormal; Notable for the following:    WBC 11.1 (*)    Neutro Abs 7.3 (*)    All other components within normal limits  MISC [facility] TEST (SEND OUT)   ____________________________________________  EKG   ____________________________________________  RADIOLOGY [facility], personally viewed as part of my medical decision making the written report by the radiologist.  US Pelvis Limited  Result Date: 04/01/2017 CLINICAL DATA:  Suprapubic pain.   Shistosomiasis. EXAM: LIMITED ULTRASOUND OF PELVIS TECHNIQUE: Limited transabdominal ultrasound examination of the pelvis was performed. COMPARISON:  CT abdomen and pelvis 01/05/2011 FINDINGS: The urinary bladder has a normal appearance for the degree of distention. No bladder wall thickening or bladder filling defects identified. Other findings: Bilateral urine flow jets are demonstrated on color flow Doppler imaging. IMPRESSION: Normal ultrasound appearance of the bladder. Electronically Signed   By: Lucienne Capers M.D.   On: 04/01/2017 21:48    ____________________________________________    PROCEDURES  Procedure(s) performed:    Procedures    Medications  praziquantel (BILTRICIDE) tablet 2,100 mg (not administered)  sodium chloride 0.9 % bolus 1,000 mL (1,000 mLs Intravenous New Bag/Given 04/01/17 2058)     ____________________________________________   INITIAL IMPRESSION / ASSESSMENT AND PLAN / ED COURSE  Pertinent labs & imaging results that were available during my care of the patient were reviewed by me and considered  in my medical decision making (see chart for details).  Review of the [place] CSRS was performed in accordance of the [place] prior to dispensing any controlled drugs.     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 Patient went swimming in the [place] 3-4 months ago and is now having "worms" in her urine. Patient brought in sample today. Symptoms, presentation, time pattern is consistent with urinary schistosomiasis. Life parasite is sent to lab for further examination. Basic labs and ultrasound returned pressure results. Patient is given first dose of Biltricide here in the emergency department. Patient will be discharged home with prescriptions for Biltricide. Patient is to follow up with primary care for follow-up urinalysis and repeat dosing for Biltricide in 4-6 weeks.. Patient is given ED precautions to return to the ED for any worsening or  new symptoms.     ____________________________________________  FINAL CLINICAL IMPRESSION(S) / ED DIAGNOSES  Final diagnoses:  Schistosomiasis, urinary      NEW MEDICATIONS STARTED DURING THIS VISIT:  New Prescriptions   PRAZIQUANTEL (BILTRICIDE) 600 MG TABLET    Take 3 tablets (1,800 mg total) by mouth every 4 (four) hours.        This chart was dictated using voice recognition software/Dragon. Despite best efforts to proofread, errors can occur which can change the meaning. Any change was purely unintentional.    Darletta Moll, PA-C 04/01/17 2250    Orbie Pyo, MD 04/01/17 [phone]

## 2017-04-01 NOTE — ED Triage Notes (Addendum)
Patient ambulatory to triage with steady gait, without difficulty or distress noted; pt reports PTA "peed a worm"; denies any c/o or accomp symptoms; pt reports after urinating, that she happened to look in the toilet and noted something floating in the water, got it out and it was moving; unsure if she urinated it out; pt has the worm with her in a cup

## 2017-04-01 NOTE — ED Notes (Signed)
Reviewed discharge instructions, follow-up care, prescription with patients with patient. Pt verbalized understanding.

## 2017-04-01 NOTE — ED Notes (Signed)
Pt given urine cup for clean sample. Worm from home placed in clean sample cup and sent to lab per order. Pt also given cup of water to facilitate obtaining urine sample.

## 2017-04-04 LAB — MISC LABCORP TEST (SEND OUT): LABCORP TEST NAME: 8219

## 2017-04-17 IMAGING — MG MM BREAST LOCALIZATION CLIP
2 series · 2 of 2 positions shown · non-contrast
Comparison: Previous exam(s).

CLINICAL DATA: Post biopsy mammogram of the left breast for clip
placement.

EXAM:
DIAGNOSTIC LEFT MAMMOGRAM POST STEREOTACTIC BIOPSY

[L CC]
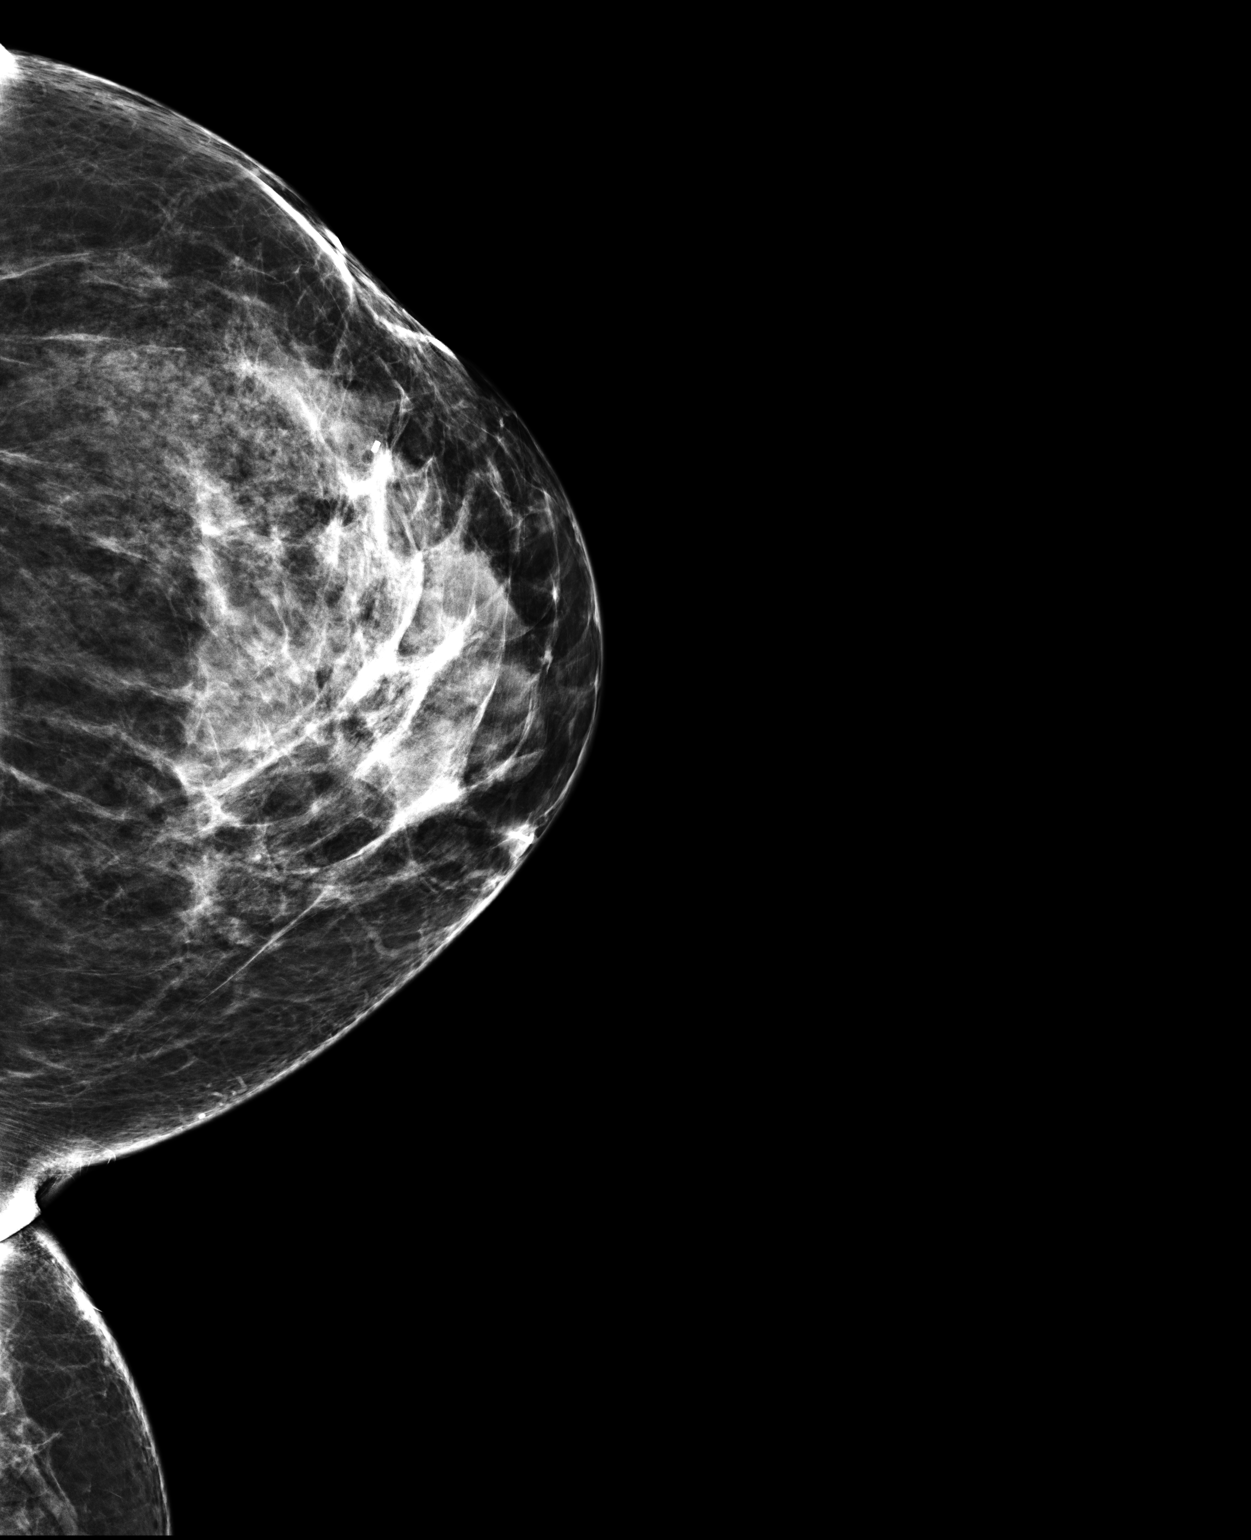

[L LM]
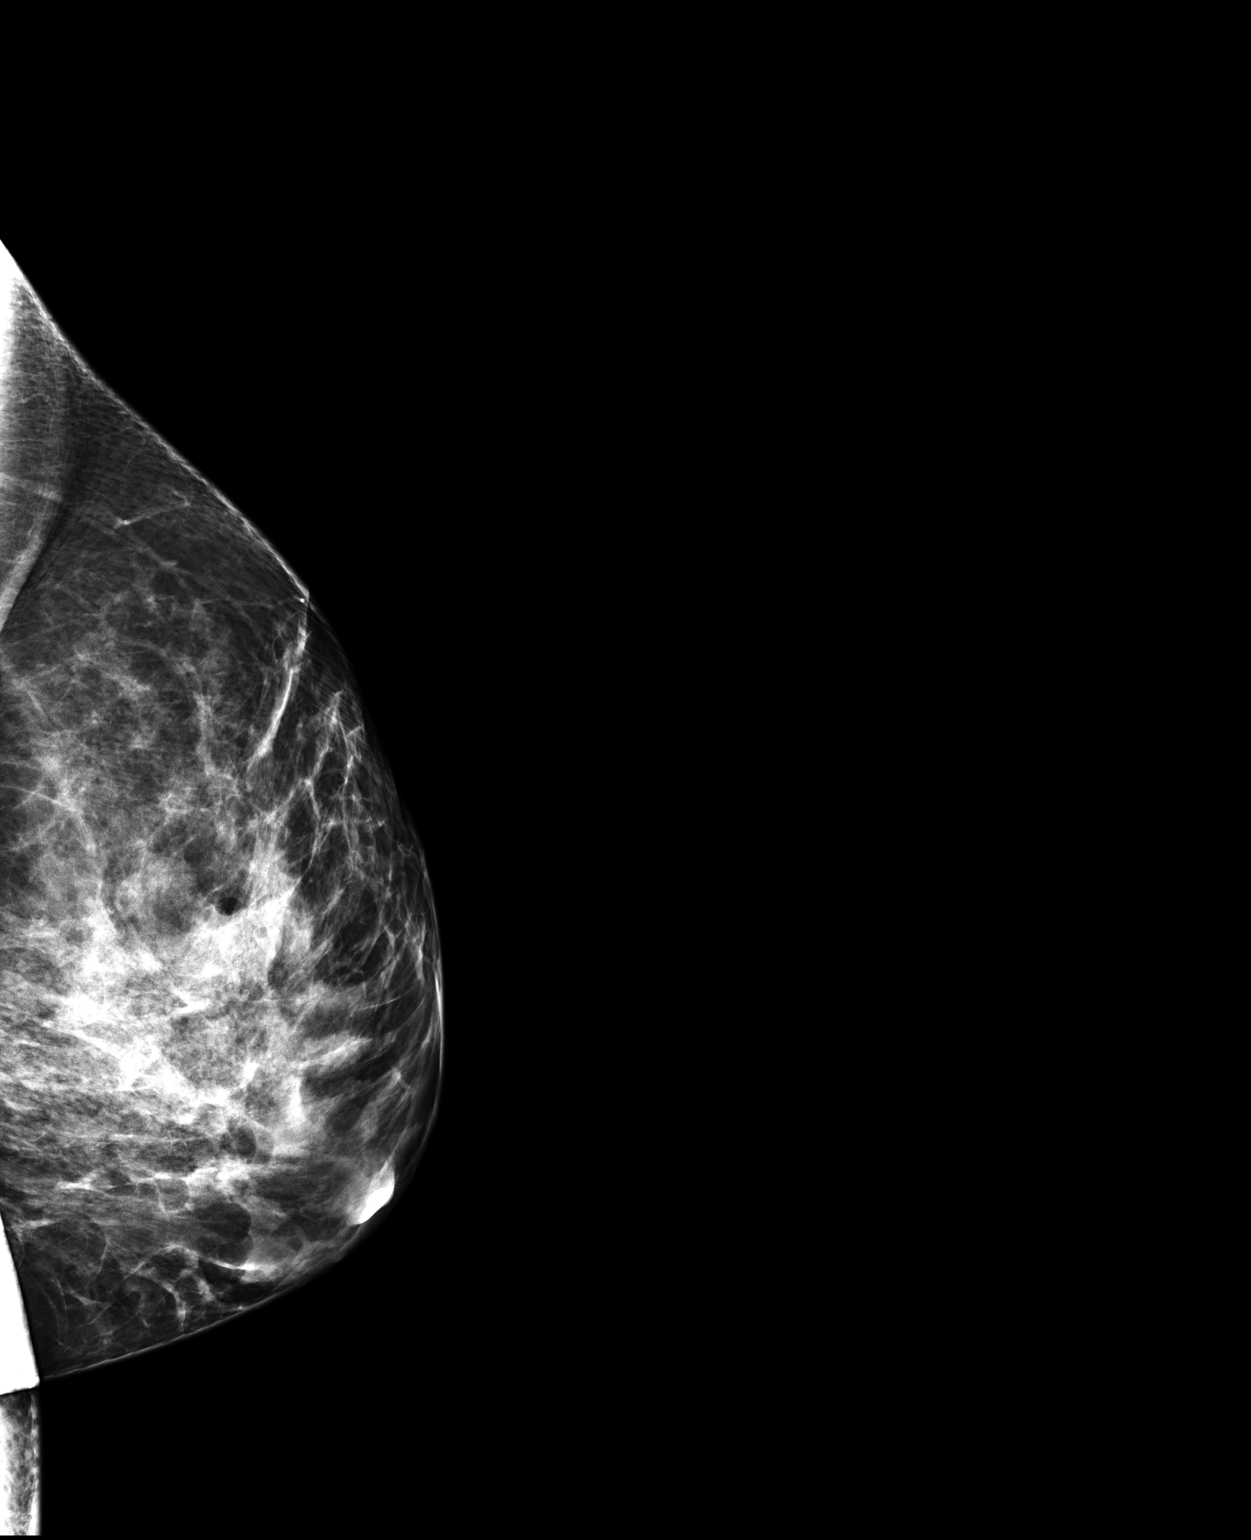

[2 of 2 positions shown; findings below may reference images not displayed]

FINDINGS: Mammographic images were obtained following stereotactic guided
biopsy of calcifications in the upper-outer left breast. The
cylinder shaped biopsy marking clip is appropriately positioned at
the site of biopsy in the upper-outer left breast.
IMPRESSION: Appropriate positioning of the cylinder shaped biopsy marking clip
in the upper-outer left breast.

Final Assessment: Post Procedure Mammograms for Marker Placement

## 2017-05-09 IMAGING — MG MM BREAST SURGICAL SPECIMEN
1 series · 1 of 1 positions shown · non-contrast
Comparison: Previous exam(s).

CLINICAL DATA: 35-year-old female status post excisional biopsy of
the left breast

EXAM:
SPECIMEN RADIOGRAPH OF THE LEFT BREAST

[L SPECIMEN]
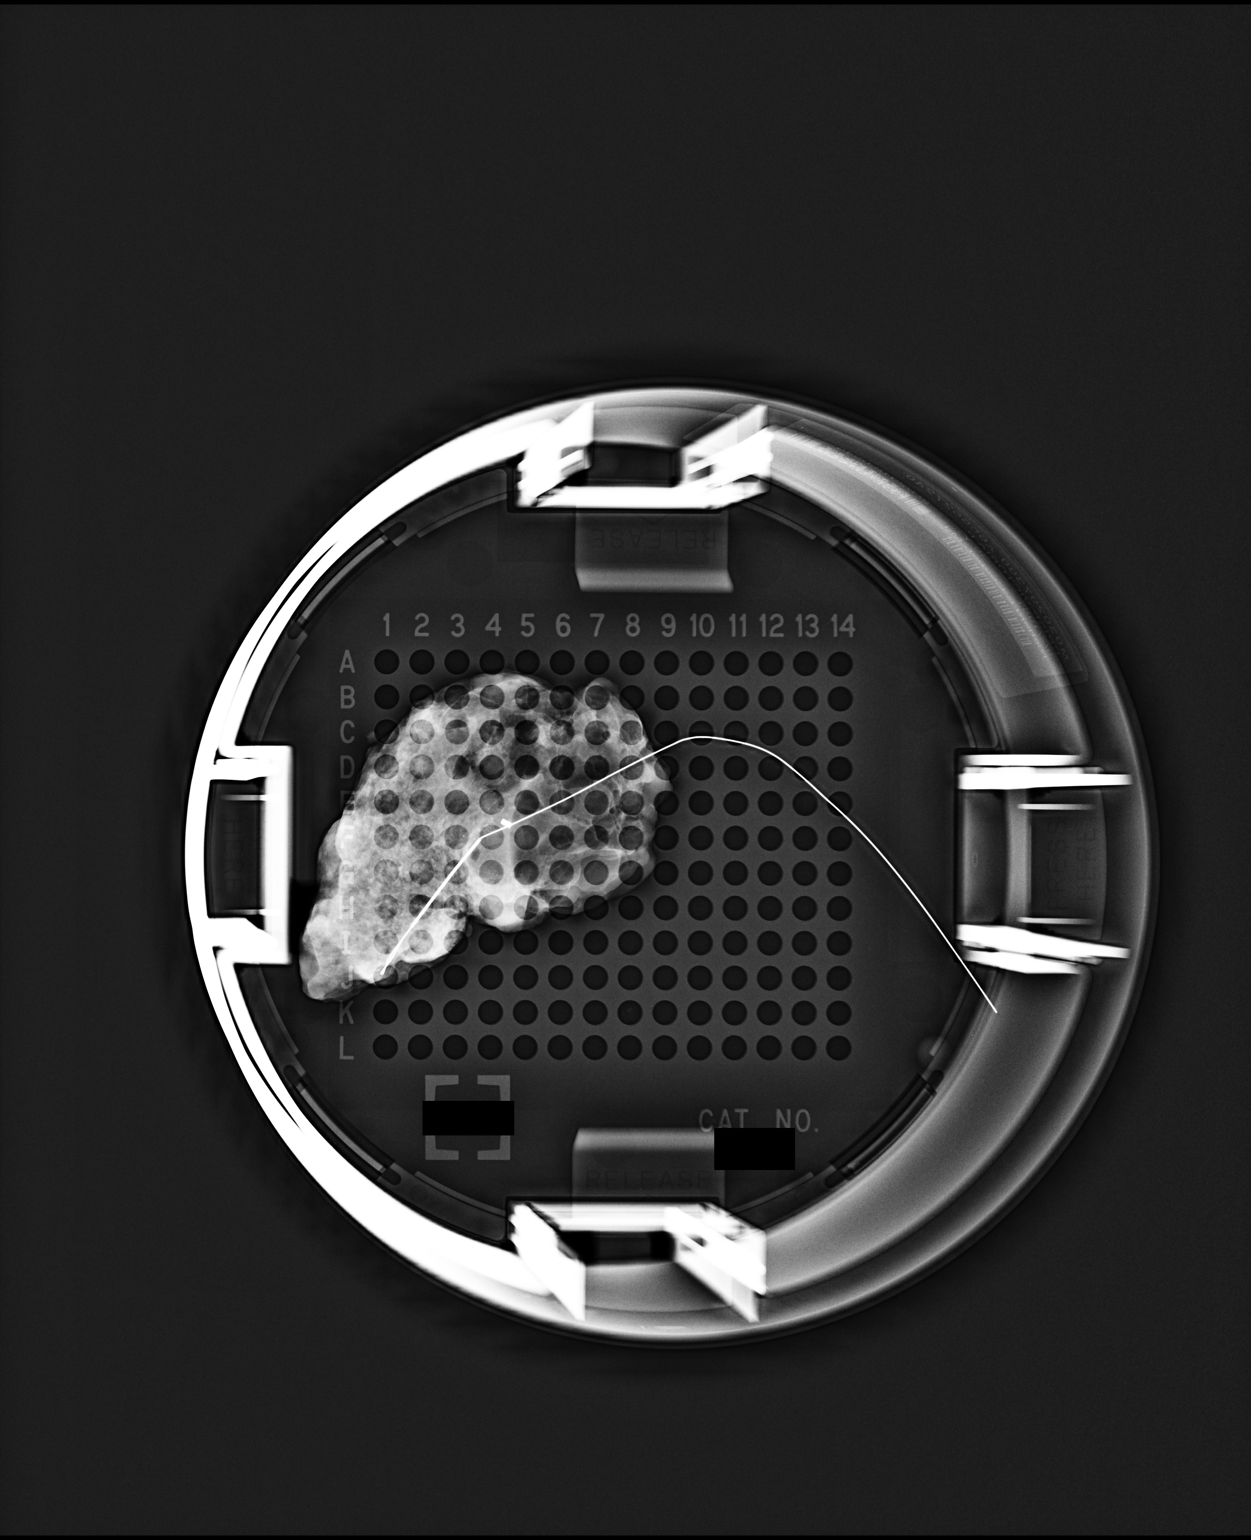

[1 of 1 positions shown; findings below may reference images not displayed]

FINDINGS: Status post excision of the left breast. The wire tip and biopsy
marker clip are present and are marked for pathology. Findings were
communicated to Dr. Hany in the operating room by Dr. Purcachi via
telephone at the time the image was obtained.
IMPRESSION: Specimen radiograph of the left breast.

## 2017-05-10 ENCOUNTER — Encounter: Payer: Self-pay | Admitting: Obstetrics and Gynecology

## 2017-05-10 ENCOUNTER — Ambulatory Visit (INDEPENDENT_AMBULATORY_CARE_PROVIDER_SITE_OTHER): Payer: 59 | Admitting: Obstetrics and Gynecology

## 2017-05-10 VITALS — BP 124/78 | Ht 67.0 in | Wt 220.0 lb

## 2017-05-10 DIAGNOSIS — Z01419 Encounter for gynecological examination (general) (routine) without abnormal findings: Secondary | ICD-10-CM

## 2017-05-10 DIAGNOSIS — Z30432 Encounter for removal of intrauterine contraceptive device: Secondary | ICD-10-CM

## 2017-05-10 DIAGNOSIS — Z1339 Encounter for screening examination for other mental health and behavioral disorders: Secondary | ICD-10-CM | POA: Diagnosis not present

## 2017-05-10 DIAGNOSIS — Z1331 Encounter for screening for depression: Secondary | ICD-10-CM | POA: Diagnosis not present

## 2017-05-10 DIAGNOSIS — Z113 Encounter for screening for infections with a predominantly sexual mode of transmission: Secondary | ICD-10-CM

## 2017-05-10 NOTE — Progress Notes (Signed)
Gynecology Annual Exam  PCP: [facility]  Chief Complaint  Patient presents with  . Annual Exam    History of Present Illness:  [name] is a 36 y.o. [id number] who LMP was No LMP recorded. Patient is not currently having periods (Reason: IUD)., presents today for her annual examination.  Her menses are rare due to IUD. Wants IUD out today.  She is single partner, contraception - IUD.  Last Pap: 04/2016  Results were: no abnormalities /neg HPV DNA negative Hx of STDs: none  Last mammogram: na/There is FH of breast cancer in her mother and her aunt. There is no FH of ovarian cancer. The patient does not do self-breast exams.  Tobacco use: 2 cigs/day. Alcohol use: social drinker Exercise: not active   The patient wears seatbelts: yes.      Past Medical History:  Diagnosis Date  . Breast mass   . GERD (gastroesophageal reflux disease)    NO MEDS    Past Surgical History:  Procedure Laterality Date  . BREAST BIOPSY Left 08/13/2016   Procedure: left BREAST BIOPSY WITH NEEDLE LOCALIZATION left Moeller's gland excision;  Surgeon: Florene Glen, MD;  Location: [facility] ORS;  Service: General;  Laterality: Left;  . CESAREAN SECTION  2007  . NO PAST SURGERIES     verified per patient 06/22/16    Prior to Admission medications   Medication Sig Start Date End Date Taking? Authorizing Provider  levonorgestrel (MIRENA) 20 MCG/24HR IUD 1 each by Intrauterine route once.   Yes [provider]    No Known Allergies  Gynecologic History: No LMP recorded. Patient is not currently having periods (Reason: IUD).  Obstetric History: [id number]  Social History   Social History  . Marital status: Married    Spouse name: N/A  . Number of children: N/A  . Years of education: N/A   Occupational History  . Not on file.   Social History Main Topics  . Smoking status: Current Every Day Smoker    Packs/day: 0.00    Years: 16.00    Types:  Cigarettes  . Smokeless tobacco: Never Used     Comment: 2 CIG PER DAY  . Alcohol use Yes     Comment: Occasional  . Drug use: No  . Sexual activity: Not on file   Other Topics Concern  . Not on file   Social History Narrative  . No narrative on file    Family History  Problem Relation Age of Onset  . Hypertension Mother   . Breast cancer Mother 69  . Kidney disease Mother   . Cirrhosis Father   . Alcohol abuse Father     Review of Systems  Constitutional: Negative.   HENT: Negative.   Eyes: Negative.   Respiratory: Negative.   Cardiovascular: Negative.   Gastrointestinal: Negative.   Genitourinary: Negative.   Musculoskeletal: Negative.   Skin: Negative.   Neurological: Negative.   Psychiatric/Behavioral: Negative.      Physical Exam BP 124/78   Ht 5\' 7"  (1.702 m)   Wt 220 lb (99.8 kg)   BMI 34.46 kg/m    Physical Exam  Constitutional: She is oriented to person, place, and time. She appears well-developed and well-nourished. No distress.  Genitourinary: Pelvic exam was performed with patient supine. There is no rash, tenderness or lesion on the right labia. There is no rash, tenderness or lesion on the left labia.  HENT:  Head: Normocephalic and atraumatic.  Eyes:  EOM are normal. No scleral icterus.  Neck: Normal range of motion. Neck supple. No thyromegaly present.  Cardiovascular: Normal rate and regular rhythm.  Exam reveals no gallop and no friction rub.   No murmur heard. Pulmonary/Chest: Effort normal and breath sounds normal. No respiratory distress. She has no wheezes. She has no rales. Right breast exhibits no inverted nipple, no mass, no nipple discharge, no skin change and no tenderness. Left breast exhibits no inverted nipple, no mass, no nipple discharge, no skin change and no tenderness.  Abdominal: Soft. Bowel sounds are normal. She exhibits no distension and no mass. There is no tenderness. There is no rebound and no guarding.  Musculoskeletal:  Normal range of motion. She exhibits no edema.  Lymphadenopathy:    She has no cervical adenopathy.  Neurological: She is alert and oriented to person, place, and time. No cranial nerve deficit.  Skin: Skin is warm and dry. No erythema.  Psychiatric: She has a normal mood and affect. Her behavior is normal. Judgment normal.   IUD Removal  Patient identified, informed consent performed, consent signed.  Patient was in the dorsal lithotomy position, normal external genitalia was noted.  A speculum was placed in the patient's vagina, normal discharge was noted, no lesions. The cervix was visualized, no lesions, no abnormal discharge.  The strings of the IUD were grasped and pulled using ring forceps. The IUD was removed in its entirety. Patient tolerated the procedure well.    Plans for pregnancy soon and she was told to avoid teratogens, take PNV and folic acid.  Routine preventative health maintenance measures emphasized.  Female chaperone present for pelvic and breast  portions of the physical exam  Results: AUDIT Questionnaire (screen for alcoholism): 7 PHQ-9: 1   Assessment: 36 y.o. [id number] female here for routine annual gynecologic examination.  Plan: Problem List Items Addressed This Visit    None    Visit Diagnoses    Women's annual routine gynecological examination    -  Primary   Relevant Orders   GC/Chlamydia Probe Amp   Screen for STD (sexually transmitted disease)       Relevant Orders   GC/Chlamydia Probe Amp   Screening for alcohol problem       Screening for depression       Encounter for IUD removal          Screening: -- Blood pressure screen normal -- Colonoscopy - not due -- Mammogram - not due -- Weight screening: obese: discussed management options, including lifestyle, dietary, and exercise. -- Depression screening negative (PHQ-9) -- Nutrition: normal -- cholesterol screening: not due for screening -- osteoporosis screening: not due -- tobacco  screening: using: discussed quitting using the 5 A's -- alcohol screening: AUDIT questionnaire indicates low-risk usage. -- family history of breast cancer screening: done. not at high risk. -- no evidence of domestic violence or intimate partner violence. -- STD screening: gonorrhea/chlamydia NAAT collected -- pap smear not collected per ASCCP guidelines -- flu vaccine declined -- HPV vaccination series: not eligilbe  Prentice Docker, MD 05/10/2017 10:16 AM

## 2017-05-13 LAB — GC/CHLAMYDIA PROBE AMP
Chlamydia trachomatis, NAA: NEGATIVE
NEISSERIA GONORRHOEAE BY PCR: NEGATIVE

## 2017-07-13 NOTE — L&D Delivery Note (Signed)
Delivery Note Primary OB: [facility] Delivery Physician: Barnett Applebaum, MD Gestational Age: Full term Antepartum complications: prior cesarean, history of chronic hypertension Intrapartum complications: None  A viable Female was delivered via vertex perentation.  Apgars:9 ,9  Weight:  pending .   Placenta status: spontaneous and Intact.  Cord: 3+ vessels;  with the following complications: none.  Anesthesia:  epidural Episiotomy:  none Lacerations:  none Suture Repair: none Est. Blood Loss (mL):  <200 mL  Mom to postpartum.  Baby to Couplet care / Skin to Skin. Plans PP BTL  Barnett Applebaum, MD, [place], [facility] 05/24/2018  9:27 PM [phone]

## 2017-09-29 ENCOUNTER — Ambulatory Visit (INDEPENDENT_AMBULATORY_CARE_PROVIDER_SITE_OTHER): Payer: 59 | Admitting: Advanced Practice Midwife

## 2017-09-29 ENCOUNTER — Encounter: Payer: Self-pay | Admitting: Obstetrics and Gynecology

## 2017-09-29 VITALS — BP 128/84 | Wt 235.0 lb

## 2017-09-29 DIAGNOSIS — O99210 Obesity complicating pregnancy, unspecified trimester: Secondary | ICD-10-CM

## 2017-09-29 DIAGNOSIS — Z6836 Body mass index (BMI) 36.0-36.9, adult: Secondary | ICD-10-CM

## 2017-09-29 DIAGNOSIS — Z131 Encounter for screening for diabetes mellitus: Secondary | ICD-10-CM

## 2017-09-29 DIAGNOSIS — Z113 Encounter for screening for infections with a predominantly sexual mode of transmission: Secondary | ICD-10-CM

## 2017-09-29 DIAGNOSIS — O0990 Supervision of high risk pregnancy, unspecified, unspecified trimester: Secondary | ICD-10-CM | POA: Insufficient documentation

## 2017-09-29 NOTE — Progress Notes (Signed)
NOB today. No vb. No lof ?

## 2017-09-29 NOTE — Patient Instructions (Signed)
Prenatal Care WHAT IS PRENATAL CARE? Prenatal care is the process of caring for a pregnant woman before she gives birth. Prenatal care makes sure that she and her baby remain as healthy as possible throughout pregnancy. Prenatal care may be provided by a midwife, family practice health care provider, or a childbirth and pregnancy specialist (obstetrician). Prenatal care may include physical examinations, testing, treatments, and education on nutrition, lifestyle, and social support services. WHY IS PRENATAL CARE SO IMPORTANT? Early and consistent prenatal care increases the chance that you and your baby will remain healthy throughout your pregnancy. This type of care also decreases a baby's risk of being born too early (prematurely), or being born smaller than expected (small for gestational age). Any underlying medical conditions you may have that could pose a risk during your pregnancy are discussed during prenatal care visits. You will also be monitored regularly for any new conditions that may arise during your pregnancy so they can be treated quickly and effectively. WHAT HAPPENS DURING PRENATAL CARE VISITS? Prenatal care visits may include the following: Discussion Tell your health care provider about any new signs or symptoms you have experienced since your last visit. These might include:  Nausea or vomiting.  Increased or decreased level of energy.  Difficulty sleeping.  Back or leg pain.  Weight changes.  Frequent urination.  Shortness of breath with physical activity.  Changes in your skin, such as the development of a rash or itchiness.  Vaginal discharge or bleeding.  Feelings of excitement or nervousness.  Changes in your baby's movements.  You may want to write down any questions or topics you want to discuss with your health care provider and bring them with you to your appointment. Examination During your first prenatal care visit, you will likely have a complete  physical exam. Your health care provider will often examine your vagina, cervix, and the position of your uterus, as well as check your heart, lungs, and other body systems. As your pregnancy progresses, your health care provider will measure the size of your uterus and your baby's position inside your uterus. He or she may also examine you for early signs of labor. Your prenatal visits may also include checking your blood pressure and, after about 10-12 weeks of pregnancy, listening to your baby's heartbeat. Testing Regular testing often includes:  Urinalysis. This checks your urine for glucose, protein, or signs of infection.  Blood count. This checks the levels of white and red blood cells in your body.  Tests for sexually transmitted infections (STIs). Testing for STIs at the beginning of pregnancy is routinely done and is required in many states.  Antibody testing. You will be checked to see if you are immune to certain illnesses, such as rubella, that can affect a developing fetus.  Glucose screen. Around 24-28 weeks of pregnancy, your blood glucose level will be checked for signs of gestational diabetes. Follow-up tests may be recommended.  Group B strep. This is a bacteria that is commonly found inside a woman's vagina. This test will inform your health care provider if you need an antibiotic to reduce the amount of this bacteria in your body prior to labor and childbirth.  Ultrasound. Many pregnant women undergo an ultrasound screening around 18-20 weeks of pregnancy to evaluate the health of the fetus and check for any developmental abnormalities.  HIV (human immunodeficiency virus) testing. Early in your pregnancy, you will be screened for HIV. If you are at high risk for HIV, this test may   be repeated during your third trimester of pregnancy.  You may be offered other testing based on your age, personal or family medical history, or other factors. HOW OFTEN SHOULD I PLAN TO SEE MY  HEALTH CARE PROVIDER FOR PRENATAL CARE? Your prenatal care check-up schedule depends on any medical conditions you have before, or develop during, your pregnancy. If you do not have any underlying medical conditions, you will likely be seen for checkups:  Monthly, during the first 6 months of pregnancy.  Twice a month during months 7 and 8 of pregnancy.  Weekly starting in the 9th month of pregnancy and until delivery.  If you develop signs of early labor or other concerning signs or symptoms, you may need to see your health care provider more often. Ask your health care provider what prenatal care schedule is best for you. WHAT CAN I DO TO KEEP MYSELF AND MY BABY AS HEALTHY AS POSSIBLE DURING MY PREGNANCY?  Take a prenatal vitamin containing 400 micrograms (0.4 mg) of folic acid every day. Your health care provider may also ask you to take additional vitamins such as iodine, vitamin D, iron, copper, and zinc.  Take 1500-2000 mg of calcium daily starting at your 20th week of pregnancy until you deliver your baby.  Make sure you are up to date on your vaccinations. Unless directed otherwise by your health care provider: ? You should receive a tetanus, diphtheria, and pertussis (Tdap) vaccination between the 27th and 36th week of your pregnancy, regardless of when your last Tdap immunization occurred. This helps protect your baby from whooping cough (pertussis) after he or she is born. ? You should receive an annual inactivated influenza vaccine (IIV) to help protect you and your baby from influenza. This can be done at any point during your pregnancy.  Eat a well-rounded diet that includes: ? Fresh fruits and vegetables. ? Lean proteins. ? Calcium-rich foods such as milk, yogurt, hard cheeses, and dark, leafy greens. ? Whole grain breads.  Do noteat seafood high in mercury, including: ? Swordfish. ? Tilefish. ? Shark. ? King mackerel. ? More than 6 oz tuna per week.  Do not  eat: ? Raw or undercooked meats or eggs. ? Unpasteurized foods, such as soft cheeses (brie, blue, or feta), juices, and milks. ? Lunch meats. ? Hot dogs that have not been heated until they are steaming.  Drink enough water to keep your urine clear or pale yellow. For many women, this may be 10 or more 8 oz glasses of water each day. Keeping yourself hydrated helps deliver nutrients to your baby and may prevent the start of pre-term uterine contractions.  Do not use any tobacco products including cigarettes, chewing tobacco, or electronic cigarettes. If you need help quitting, ask your health care provider.  Do not drink beverages containing alcohol. No safe level of alcohol consumption during pregnancy has been determined.  Do not use any illegal drugs. These can harm your developing baby or cause a miscarriage.  Ask your health care provider or pharmacist before taking any prescription or over-the-counter medicines, herbs, or supplements.  Limit your caffeine intake to no more than 200 mg per day.  Exercise. Unless told otherwise by your health care provider, try to get 30 minutes of moderate exercise most days of the week. Do not  do high-impact activities, contact sports, or activities with a high risk of falling, such as horseback riding or downhill skiing.  Get plenty of rest.  Avoid anything that raises your   body temperature, such as hot tubs and saunas.  If you own a cat, do not empty its litter box. Bacteria contained in cat feces can cause an infection called toxoplasmosis. This can result in serious harm to the fetus.  Stay away from chemicals such as insecticides, lead, mercury, and cleaning or paint products that contain solvents.  Do not have any X-rays taken unless medically necessary.  Take a childbirth and breastfeeding preparation class. Ask your health care provider if you need a referral or recommendation.  This information is not intended to replace advice given  to you by your health care provider. Make sure you discuss any questions you have with your health care provider. Document Released: 07/02/2003 Document Revised: 12/02/2015 Document Reviewed: 09/13/2013 [employer] Interactive Patient Education  2017 [employer] Exercise During Pregnancy For people of all ages, exercise is an important part of being healthy. Exercise improves heart and lung function and helps to maintain strength, flexibility, and a healthy body weight. Exercise also boosts energy levels and elevates mood. For most women, maintaining an exercise routine throughout pregnancy is recommended. It is only on rare occasions and with certain medical conditions or pregnancy complications that women may be asked to limit or avoid exercise during pregnancy. What are some other benefits to exercising during pregnancy? Along with maintaining strength and flexibility, exercising throughout pregnancy can help to:  Keep strength in muscles that are very important during labor and childbirth.  Decrease low back pain during pregnancy.  Decrease the risk of developing gestational diabetes mellitus (GDM).  Improve blood sugar (glucose) control for women who have GDM.  Decrease the risk of developing preeclampsia. This is a serious condition that causes high blood pressure along with other symptoms, such as swelling and headaches.  Decrease the risk of cesarean delivery.  Speed up the recovery after giving birth.  How often should I exercise? Unless your health care provider gives you different instructions, you should try to exercise on most days or all days of the week. In general, try to exercise with moderate intensity for about 150 minutes per week. This can be spread out across several days, such as exercising for 30 minutes per day on 5 days of each week. You can tell that you are exercising at a moderate intensity if you have a higher heart rate and faster breathing, but you are still  able to hold a conversation. What types of moderate-intensity exercise are recommended during pregnancy? There are many types of exercise that are safe for you to do during pregnancy. Unless your health care provider gives you different instructions, do a variety of exercises that safely increase your heart and breathing (cardiopulmonary) rates and help you to build and maintain muscle strength (strength training). You should always be able to talk in full sentences while exercising during pregnancy. Some examples of exercising that is safe to do during pregnancy include:  Brisk walking or hiking.  Swimming.  Water aerobics.  Riding a stationary bike.  Strength training.  Modified yoga or Pilates. Tell your instructor that you are pregnant. Avoid overstretching and avoid lying on your back for long periods of time.  Running or jogging. Only choose this type of exercise if: ? You ran or jogged regularly before your pregnancy. ? You can run or jog and still talk in complete sentences.  What types of exercise should I not do during pregnancy? Depending on your level of fitness and whether you exercised regularly before your pregnancy, you may be   advised to limit vigorous-intensity exercise during your pregnancy. You can tell that you are exercising at a vigorous intensity if you are breathing much harder and faster and cannot hold a conversation while exercising. Some examples of exercising that you should avoid during pregnancy include:  Contact sports.  Activities that place you at risk for falling on or being hit in the belly, such as downhill skiing, water skiing, surfing, rock climbing, cycling, gymnastics, and horseback riding.  Scuba diving.  Sky diving.  Yoga or Pilates in a room that is heated to extreme temperatures ("hot yoga" or "hot Pilates").  Jogging or running, unless you ran or jogged regularly before your pregnancy. While jogging or running, you should always be able  to talk in full sentences. Do not run or jog so vigorously that you are unable to have a conversation.  If you are not used to exercising at elevation (more than 6,000 feet above sea level), do not do so during your pregnancy.  When should I avoid exercising during pregnancy? Certain medical conditions can make it unsafe to exercise during pregnancy, or they may increase your risk of miscarriage or early labor and birth. Some of these conditions include:  Some types of heart disease.  Some types of lung disease.  Placenta previa. This is when the placenta partially or completely covers the opening of the uterus (cervix).  Frequent bleeding from the vagina during your pregnancy.  Incompetent cervix. This is when your cervix does not remain as tightly closed during pregnancy as it should.  Premature labor.  Ruptured membranes. This is when the protective sac (amniotic sac) opens up and amniotic fluid leaks from your vagina.  Severely low blood count (anemia).  Preeclampsia or pregnancy-caused high blood pressure.  Carrying more than one baby (multiple gestation) and having an additional risk of early labor.  Poorly controlled diabetes.  Being severely underweight or severely overweight.  Intrauterine growth restriction. This is when your baby's growth and development during pregnancy are slower than expected.  Other medical conditions. Ask your health care provider if any apply to you.  What else should I know about exercising during pregnancy? You should take these precautions while exercising during pregnancy:  Avoid overheating. ? Wear loose-fitting, breathable clothes. ? Do not exercise in very high temperatures.  Avoid dehydration. Drink enough water before, during, and after exercise to keep your urine clear or pale yellow.  Avoid overstretching. Because of hormone changes during pregnancy, it is easy to overstretch muscles, tendons, and ligaments during  pregnancy.  Start slowly and ask your health care provider to recommend types of exercise that are safe for you, if exercising regularly is new for you.  Pregnancy is not a time for exercising to lose weight. When should I seek medical care? You should stop exercising and call your health care provider if you have any unusual symptoms, such as:  Mild uterine contractions or abdominal cramping.  Dizziness that does not improve with rest.  When should I seek immediate medical care? You should stop exercising and call your local emergency services (911 in the [place] if you have any unusual symptoms, such as:  Sudden, severe pain in your low back or your belly.  Uterine contractions or abdominal cramping that do not improve with rest.  Chest pain.  Bleeding or fluid leaking from your vagina.  Shortness of breath.  This information is not intended to replace advice given to you by your health care provider. Make sure you discuss any   questions you have with your health care provider. Document Released: 06/29/2005 Document Revised: 11/27/2015 Document Reviewed: 09/06/2014 [employer] Interactive Patient Education  2018 [employer] Eating Plan for Pregnant Women While you are pregnant, your body will require additional nutrition to help support your growing baby. It is recommended that you consume:  150 additional calories each day during your first trimester.  300 additional calories each day during your second trimester.  300 additional calories each day during your third trimester.  Eating a healthy, well-balanced diet is very important for your health and for your baby's health. You also have a higher need for some vitamins and minerals, such as folic acid, calcium, iron, and vitamin D. What do I need to know about eating during pregnancy?  Do not try to lose weight or go on a diet during pregnancy.  Choose healthy, nutritious foods. Choose  of a sandwich with a glass of milk  instead of a candy bar or a high-calorie sugar-sweetened beverage.  Limit your overall intake of foods that have "empty calories." These are foods that have little nutritional value, such as sweets, desserts, candies, sugar-sweetened beverages, and fried foods.  Eat a variety of foods, especially fruits and vegetables.  Take a prenatal vitamin to help meet the additional needs during pregnancy, specifically for folic acid, iron, calcium, and vitamin D.  Remember to stay active. Ask your health care provider for exercise recommendations that are specific to you.  Practice good food safety and cleanliness, such as washing your hands before you eat and after you prepare raw meat. This helps to prevent foodborne illnesses, such as listeriosis, that can be very dangerous for your baby. Ask your health care provider for more information about listeriosis. What does 150 extra calories look like? Healthy options for an additional 150 calories each day could be any of the following:  Plain low-fat yogurt (6-8 oz) with  cup of berries.  1 apple with 2 teaspoons of peanut butter.  Cut-up vegetables with  cup of hummus.  Low-fat chocolate milk (8 oz or 1 cup).  1 string cheese with 1 medium orange.   of a peanut butter and jelly sandwich on whole-wheat bread (1 tsp of peanut butter).  For 300 calories, you could eat two of those healthy options each day. What is a healthy amount of weight to gain? The recommended amount of weight for you to gain is based on your pre-pregnancy BMI. If your pre-pregnancy BMI was:  Less than 18 (underweight), you should gain 28-40 lb.  18-24.9 (normal), you should gain 25-35 lb.  25-29.9 (overweight), you should gain 15-25 lb.  Greater than 30 (obese), you should gain 11-20 lb.  What if I am having twins or multiples? Generally, pregnant women who will be having twins or multiples may need to increase their daily calories by 300-600 calories each day. The  recommended range for total weight gain is 25-54 lb, depending on your pre-pregnancy BMI. Talk with your health care provider for specific guidance about additional nutritional needs, weight gain, and exercise during your pregnancy. What foods can I eat? Grains Any grains. Try to choose whole grains, such as whole-wheat bread, oatmeal, or brown rice. Vegetables Any vegetables. Try to eat a variety of colors and types of vegetables to get a full range of vitamins and minerals. Remember to wash your vegetables well before eating. Fruits Any fruits. Try to eat a variety of colors and types of fruit to get a full range of vitamins and   minerals. Remember to wash your fruits well before eating. Meats and Other Protein Sources Lean meats, including chicken, turkey, fish, and lean cuts of beef, veal, or pork. Make sure that all meats are cooked to "well done." Tofu. Tempeh. Beans. Eggs. Peanut butter and other nut butters. Seafood, such as shrimp, crab, and lobster. If you choose fish, select types that are higher in omega-3 fatty acids, including salmon, herring, mussels, trout, sardines, and pollock. Make sure that all meats are cooked to food-safe temperatures. Dairy Pasteurized milk and milk alternatives. Pasteurized yogurt and pasteurized cheese. Cottage cheese. Sour cream. Beverages Water. Juices that contain 100% fruit juice or vegetable juice. Caffeine-free teas and decaffeinated coffee. Drinks that contain caffeine are okay to drink, but it is better to avoid caffeine. Keep your total caffeine intake to less than 200 mg each day (12 oz of coffee, tea, or soda) or as directed by your health care provider. Condiments Any pasteurized condiments. Sweets and Desserts Any sweets and desserts. Fats and Oils Any fats and oils. The items listed above may not be a complete list of recommended foods or beverages. Contact your dietitian for more options. What foods are not  recommended? Vegetables Unpasteurized (raw) vegetable juices. Fruits Unpasteurized (raw) fruit juices. Meats and Other Protein Sources Cured meats that have nitrates, such as bacon, salami, and hotdogs. Luncheon meats, bologna, or other deli meats (unless they are reheated until they are steaming hot). Refrigerated pate, meat spreads from a meat counter, smoked seafood that is found in the refrigerated section of a store. Raw fish, such as sushi or sashimi. High mercury content fish, such as tilefish, shark, swordfish, and king mackerel. Raw meats, such as tuna or beef tartare. Undercooked meats and poultry. Make sure that all meats are cooked to food-safe temperatures. Dairy Unpasteurized (raw) milk and any foods that have raw milk in them. Soft cheeses, such as feta, queso blanco, queso fresco, Brie, Camembert cheeses, blue-veined cheeses, and Panela cheese (unless it is made with pasteurized milk, which must be stated on the label). Beverages Alcohol. Sugar-sweetened beverages, such as sodas, teas, or energy drinks. Condiments Homemade fermented foods and drinks, such as pickles, sauerkraut, or kombucha drinks. (Store-bought pasteurized versions of these are okay.) Other Salads that are made in the store, such as ham salad, chicken salad, egg salad, tuna salad, and seafood salad. The items listed above may not be a complete list of foods and beverages to avoid. Contact your dietitian for more information. This information is not intended to replace advice given to you by your health care provider. Make sure you discuss any questions you have with your health care provider. Document Released: 04/13/2014 Document Revised: 12/05/2015 Document Reviewed: 12/12/2013 [employer] Interactive Patient Education  2018 [employer]  

## 2017-09-29 NOTE — Progress Notes (Signed)
New Obstetric Patient H&P    Chief Complaint: "Desires prenatal care"   History of Present Illness: Patient is a 37 y.o. [id number] Not Hispanic or Latino female, presents with amenorrhea and positive home pregnancy test. Patient's last menstrual period was 08/18/2017. and based on her  LMP, her EDD is Estimated Date of Delivery: 05/25/18 and her EGA is [redacted]w[redacted]d. Cycles are 5. days, regular, and occur approximately every : 28 days. Her last pap smear was 1 years ago and was no abnormalities.    She had a urine pregnancy test which was positive 2 week(s)  ago. Her last menstrual period was normal and lasted for  4 or 5 day(s). Since her LMP she claims she has experienced breast tenderness, fatigue. She denies vaginal bleeding. Her past medical history is contributory for obesity. Her prior pregnancies are notable for SVD G1, SVD G2, SAB G3, C/S fetal distress G4, VBAC G5  Since her LMP, she admits to the use of tobacco products  Yes she is smoking 1 cigarette per day and is trying to quit She claims she has gained   no pounds since the start of her pregnancy.  There are cats in the home in the home  yes If yes Indoor- she is not changing the litterbox She admits close contact with children on a regular basis  yes  She has had chicken pox in the past yes She has had Tuberculosis exposures, symptoms, or previously tested positive for TB   no Current or past history of domestic violence. no  Genetic Screening/Teratology Counseling: (Includes patient, baby's father, or anyone in either family with:)   43. Patient's age >/= 62 at [facility]  yes 2. Thalassemia ([place], [place], [place], or Asian background): MCV<80  no 3. Neural tube defect (meningomyelocele, spina bifida, anencephaly)  no 4. Congenital heart defect  no  5. Down syndrome  no 6. Tay-Sachs (Jewish, [place])  no 7. Canavan's Disease  no 8. 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 Hemophilia or other blood disorders   no  10. Muscular dystrophy  no  11. Cystic fibrosis  no  12. Huntington's Chorea  no  13. Mental retardation/autism  no 14. Other inherited genetic or chromosomal disorder  no 15. Maternal metabolic disorder (DM, PKU, etc)  no 16. Patient or FOB with a child with a birth defect not listed above no  16a. Patient or FOB with a birth defect themselves no 17. Recurrent pregnancy loss, or stillbirth  no  18. Any medications since LMP other than prenatal vitamins (include vitamins, supplements, OTC meds, drugs, alcohol)  no 19. Any other genetic/environmental exposure to discuss  no  Infection History:   1. Lives with someone with TB or TB exposed  no  2. Patient or partner has history of genital herpes  yes 3. Rash or viral illness since LMP  no 4. History of STI (GC, CT, HPV, syphilis, HIV)  no 5. History of recent travel :  no  Other pertinent information:  no    Review of Systems:10 point review of systems negative unless otherwise noted in HPI  Past Medical History:  Past Medical History:  Diagnosis Date  . Breast mass   . GERD (gastroesophageal reflux disease)    NO MEDS    Past Surgical History:  Past Surgical History:  Procedure Laterality Date  . BREAST BIOPSY Left 08/13/2016   Procedure: left BREAST BIOPSY WITH NEEDLE LOCALIZATION left Moeller's gland excision;  Surgeon: Jerrol Banana  Burt Knack, MD;  Location: [facility] ORS;  Service: General;  Laterality: Left;  . CESAREAN SECTION  2007  . NO PAST SURGERIES     verified per patient 06/22/16    Gynecologic History: Patient's last menstrual period was 08/18/2017.  Obstetric History: [id number]  Family History:  Family History  Problem Relation Age of Onset  . Hypertension Mother   . Breast cancer Mother 45  . Kidney disease Mother   . Cirrhosis Father   . Alcohol abuse Father     Social History:  Social History   Socioeconomic History  . Marital status: Married    Spouse name: Not on file  . Number of children: Not  on file  . Years of education: Not on file  . Highest education level: Not on file  Social Needs  . Financial resource strain: Not on file  . Food insecurity - worry: Not on file  . Food insecurity - inability: Not on file  . Transportation needs - medical: Not on file  . Transportation needs - non-medical: Not on file  Occupational History  . Not on file  Tobacco Use  . Smoking status: Current Every Day Smoker    Packs/day: 0.00    Years: 16.00    Pack years: 0.00    Types: Cigarettes  . Smokeless tobacco: Never Used  . Tobacco comment: 2 CIG PER DAY/trying to quit  Substance and Sexual Activity  . Alcohol use: Yes    Comment: Occasional  . Drug use: No  . Sexual activity: Yes    Birth control/protection: None  Other Topics Concern  . Not on file  Social History Narrative  . Not on file    Allergies:  No Known Allergies  Medications: Prior to Admission medications   Medication Sig Start Date End Date Taking? Authorizing Provider  levonorgestrel (MIRENA) 20 MCG/24HR IUD 1 each by Intrauterine route once.    [provider]    Physical Exam Vitals: Blood pressure 128/84, weight 235 lb (106.6 kg), last menstrual period 08/18/2017.  General: NAD HEENT: normocephalic, anicteric Thyroid: no enlargement, no palpable nodules Pulmonary: No increased work of breathing, CTAB Cardiovascular: RRR, distal pulses 2+ Abdomen: NABS, soft, non-tender, non-distended.  Umbilicus without lesions.  No hepatomegaly, splenomegaly or masses palpable. No evidence of hernia  Genitourinary:  External: Normal external female genitalia.  Normal urethral meatus, normal Bartholin's and Skene's glands.    Vagina: Normal vaginal mucosa, no evidence of prolapse.    Cervix: Grossly normal in appearance, no bleeding  Uterus: deferred for no concerns/no PAP interval/shared decision making  Adnexa: deferred for no concerns/shared decision making  Rectal: deferred Extremities: no edema,  erythema, or tenderness Neurologic: Grossly intact Psychiatric: mood appropriate, affect full   Assessment: 37 y.o. G3O7564 at [redacted]w[redacted]d presenting to initiate prenatal care  Plan: 1) Avoid alcoholic beverages. 2) Patient encouraged not to smoke.  3) Discontinue the use of all non-medicinal drugs and chemicals.  4) Take prenatal vitamins daily.  5) Nutrition, food safety (fish, cheese advisories, and high nitrite foods) and exercise discussed. 6) Hospital and practice style discussed with cross coverage system.  7) Genetic Screening, such as with 1st Trimester Screening, cell free fetal DNA, AFP testing, and Ultrasound, as well as with amniocentesis and CVS as appropriate, is discussed with patient. At the conclusion of today's visit patient undecided genetic testing. She is considering cfDNA. 8) Patient is asked about travel to areas at risk for the [place] virus, and counseled to avoid travel and exposure  to mosquitoes or sexual partners who may have themselves been exposed to the virus. Testing is discussed, and will be ordered as appropriate.  9) Dating scan in 7-10 days  Rod Can, [place], [facility] 09/29/2017, 11:08 AM

## 2017-10-02 LAB — CHLAMYDIA/GONOCOCCUS/TRICHOMONAS, NAA
CHLAMYDIA BY NAA: NEGATIVE
Gonococcus by NAA: NEGATIVE
Trich vag by NAA: NEGATIVE

## 2017-10-02 LAB — URINE CULTURE

## 2017-10-06 ENCOUNTER — Other Ambulatory Visit: Payer: 59

## 2017-10-06 ENCOUNTER — Ambulatory Visit (INDEPENDENT_AMBULATORY_CARE_PROVIDER_SITE_OTHER): Payer: 59

## 2017-10-06 ENCOUNTER — Encounter: Payer: Self-pay | Admitting: Obstetrics and Gynecology

## 2017-10-06 ENCOUNTER — Ambulatory Visit: Payer: 59 | Admitting: Obstetrics and Gynecology

## 2017-10-06 VITALS — BP 124/82 | Wt 234.0 lb

## 2017-10-06 DIAGNOSIS — Z6836 Body mass index (BMI) 36.0-36.9, adult: Secondary | ICD-10-CM

## 2017-10-06 DIAGNOSIS — O0990 Supervision of high risk pregnancy, unspecified, unspecified trimester: Secondary | ICD-10-CM

## 2017-10-06 DIAGNOSIS — Z113 Encounter for screening for infections with a predominantly sexual mode of transmission: Secondary | ICD-10-CM

## 2017-10-06 DIAGNOSIS — Z98891 History of uterine scar from previous surgery: Secondary | ICD-10-CM | POA: Insufficient documentation

## 2017-10-06 DIAGNOSIS — O99210 Obesity complicating pregnancy, unspecified trimester: Secondary | ICD-10-CM

## 2017-10-06 DIAGNOSIS — O09521 Supervision of elderly multigravida, first trimester: Secondary | ICD-10-CM

## 2017-10-06 DIAGNOSIS — Z131 Encounter for screening for diabetes mellitus: Secondary | ICD-10-CM

## 2017-10-06 DIAGNOSIS — Z3A01 Less than 8 weeks gestation of pregnancy: Secondary | ICD-10-CM

## 2017-10-06 DIAGNOSIS — O09529 Supervision of elderly multigravida, unspecified trimester: Secondary | ICD-10-CM | POA: Insufficient documentation

## 2017-10-06 NOTE — Progress Notes (Incomplete)
  Routine Prenatal Care Visit  Subjective  Danielle Romero is a 37 y.o. [phone] at [redacted]w[redacted]d being seen today for ongoing prenatal care.  She is currently monitored for the following issues for this {Blank single:19197::"high-risk","low-risk"} pregnancy and has Breast mass, left; Abnormal mammogram of left breast; Supervision of high risk pregnancy, antepartum; History of cesarean delivery; and Advanced maternal age in multigravida on their problem list.  ----------------------------------------------------------------------------------- Patient reports {sx:14538}.    . Vag. Bleeding: None.  Movement: Absent. Denies leaking of fluid.  ----------------------------------------------------------------------------------- The following portions of the patient's history were reviewed and updated as appropriate: allergies, current medications, past family history, past medical history, past social history, past surgical history and problem list. Problem list updated.   Objective  Blood pressure 124/82, weight 234 lb (106.1 kg), last menstrual period 08/18/2017. Pregravid weight 235 lb (106.6 kg) Total Weight Gain -1 lb (-0.454 kg) Urinalysis: Urine Protein: Negative Urine Glucose: Negative  Fetal Status: Fetal Heart Rate (bpm): Present   Movement: Absent     General:  Alert, oriented and cooperative. Patient is in no acute distress.  Skin: Skin is warm and dry. No rash noted.   Cardiovascular: Normal heart rate noted  Respiratory: Normal respiratory effort, no problems with respiration noted  Abdomen: Soft, gravid, appropriate for gestational age. Pain/Pressure: Absent     Pelvic:  {Blank single:19197::"Cervical exam performed","Cervical exam deferred"}        Extremities: Normal range of motion.     Mental Status: Normal mood and affect. Normal behavior. Normal judgment and thought content.   Assessment   37 y.o. [id number] at [redacted]w[redacted]d by  05/25/2018, by Last Menstrual Period presenting for {Blank  single:19197::"routine","work-in"} prenatal visit  Plan   pregnancy Problems (from 09/29/17 to present)    Problem Noted Resolved   History of cesarean delivery 10/06/2017 by Will Bonnet, MD No   Advanced maternal age in multigravida 10/06/2017 by Will Bonnet, MD No       {Blank single:19197::"Term","Preterm"} labor symptoms and general obstetric precautions including but not limited to vaginal bleeding, contractions, leaking of fluid and fetal movement were reviewed in detail with the patient. Please refer to After Visit Summary for other counseling recommendations.   Return in about 1 month (around 11/03/2017) for Routine Prenatal Appointment.  Prentice Docker, MD, [facility], [facility] 10/06/2017 6:10 PM

## 2017-10-06 NOTE — Progress Notes (Signed)
Routine Prenatal Care Visit  Subjective  Danielle Romero is a 37 y.o. [phone] at [redacted]w[redacted]d being seen today for ongoing prenatal care.  She is currently monitored for the following issues for this high-risk pregnancy and has Breast mass, left; Abnormal mammogram of left breast; Supervision of high risk pregnancy, antepartum; History of cesarean delivery; and Advanced maternal age in multigravida on their problem list.  ----------------------------------------------------------------------------------- Patient reports no complaints.    . Vag. Bleeding: None.  Movement: Absent. Denies leaking of fluid.  Dating u/s confirms EDD by LMP.  Singleton viable IUP noted. ----------------------------------------------------------------------------------- The following portions of the patient's history were reviewed and updated as appropriate: allergies, current medications, past family history, past medical history, past social history, past surgical history and problem list. Problem list updated.   Objective  Blood pressure 124/82, weight 234 lb (106.1 kg), last menstrual period 08/18/2017. Pregravid weight 235 lb (106.6 kg) Total Weight Gain -1 lb (-0.454 kg) Urinalysis: Urine Protein: Negative Urine Glucose: Negative  Fetal Status: Fetal Heart Rate (bpm): Present   Movement: Absent     General:  Alert, oriented and cooperative. Patient is in no acute distress.  Skin: Skin is warm and dry. No rash noted.   Cardiovascular: Normal heart rate noted  Respiratory: Normal respiratory effort, no problems with respiration noted  Abdomen: Soft, gravid, appropriate for gestational age. Pain/Pressure: Absent     Pelvic:  Cervical exam deferred        Extremities: Normal range of motion.     Mental Status: Normal mood and affect. Normal behavior. Normal judgment and thought content.   Report generated by me after review of her ultrasound today: US Ob Comp Less 14 Wks  Result Date: 10/06/2017 ULTRASOUND  REPORT Location: [facility] Date of Service: 10/06/2017 Indications:dating Findings: Danielle Romero intrauterine pregnancy is visualized with a CRL consistent with [redacted]w[redacted]d gestation, giving an (U/S) EDD of 05/27/2018. The (U/S) EDD is consistent with the clinically established EDD of 05/25/2018. FHR: 138 bpm CRL measurement: 7.8 mm Yolk sac is visualized and appears normal and early anatomy is normal. Amnion: visualized and appears normal Right Ovary is normal in appearance. Left Ovary is not seen. Corpus luteal cyst:  Right ovary Survey of the adnexa demonstrates no adnexal masses. There is no free peritoneal fluid in the cul de sac. Impression: 1. [redacted]w[redacted]d Viable Singleton Intrauterine pregnancy by U/S. 2. (U/S) EDD is consistent with Clinically established EDD of 05/25/2018. Recommendations: 1.Clinical correlation with the patient's History and Physical Exam. Edwena Bunde, RDMS, RVT There is a viable singleton gestation.  Detailed evaluation of the fetal anatomy is precluded by early gestational age.  It must be noted that a normal ultrasound particular at this early gestational age is unable to rule out fetal aneuploidy, risk of first trimester miscarriage, or anatomic birth defects. Prentice Docker, MD, [facility], [facility] 10/06/2017 5:36 PM      Assessment   37 y.o. [id number] at [redacted]w[redacted]d by  05/25/2018, by Last Menstrual Period presenting for routine prenatal visit  Plan   pregnancy Problems (from 09/29/17 to present)    Problem Noted Resolved   History of cesarean delivery 10/06/2017 by Will Bonnet, MD No   Advanced maternal age in multigravida 10/06/2017 by Will Bonnet, MD No   Supervision of high risk pregnancy, antepartum 09/29/2017 by Rod Can, CNM No   Overview Signed 09/29/2017 11:06 AM by Rod Can, [place] Prenatal Labs  Dating  Blood type:  Genetic Screen 1 Screen:    AFP:     Quad:     NIPS: Antibody:   Anatomic [place]   Rubella:   Varicella: @VZVIGG @  GTT Early:               Third trimester:  RPR:     Rhogam  HBsAg:     TDaP vaccine   Flu Shot: declined HIV:     Baby Food                                GBS:   Contraception  Pap:  CBB     CS/VBAC With G4/VBAC G5   Support Person Michael             Preterm labor symptoms and general obstetric precautions including but not limited to vaginal bleeding, contractions, leaking of fluid and fetal movement were reviewed in detail with the patient. Please refer to After Visit Summary for other counseling recommendations.   Return in about 1 month (around 11/03/2017) for Routine Prenatal Appointment.  -Patient to consider NIPT testing after long discussion today. - 1h gtt today.  Prentice Docker, MD, [facility], [facility] 10/06/2017 6:10 PM

## 2017-10-07 LAB — RPR+RH+ABO+RUB AB+AB SCR+CB...
Antibody Screen: NEGATIVE
HEMOGLOBIN: 11.7 g/dL (ref 11.1–15.9)
HIV Screen 4th Generation wRfx: NONREACTIVE
Hematocrit: 34.9 % (ref 34.0–46.6)
Hepatitis B Surface Ag: NEGATIVE
MCH: 27.1 pg (ref 26.6–33.0)
MCHC: 33.5 g/dL (ref 31.5–35.7)
MCV: 81 fL (ref 79–97)
Platelets: 271 10*3/uL (ref 150–379)
RBC: 4.31 x10E6/uL (ref 3.77–5.28)
RDW: 13.6 % (ref 12.3–15.4)
RPR Ser Ql: NONREACTIVE
Rh Factor: POSITIVE
Rubella Antibodies, IGG: 4.84 index (ref >0.99)
VARICELLA: 704 {index} (ref >165)
WBC: 10.4 10*3/uL (ref 3.4–10.8)

## 2017-10-07 LAB — GLUCOSE, 1 HOUR GESTATIONAL: Gestational Diabetes Screen: 109 mg/dL (ref 65–139)

## 2017-10-08 ENCOUNTER — Telehealth: Payer: Self-pay | Admitting: Obstetrics and Gynecology

## 2017-10-08 NOTE — Telephone Encounter (Signed)
Patient aware that her FMLA paperwork has been faxed through to [facility].

## 2017-10-09 ENCOUNTER — Ambulatory Visit
Admission: EM | Admit: 2017-10-09 | Discharge: 2017-10-09 | Disposition: A | Discharge status: Home / Self Care | Payer: 59 | Attending: Emergency Medicine | Admitting: Emergency Medicine

## 2017-10-09 DIAGNOSIS — J069 Acute upper respiratory infection, unspecified: Secondary | ICD-10-CM

## 2017-10-09 HISTORY — DX: Bronchitis, not specified as acute or chronic: J40

## 2017-10-09 NOTE — ED Provider Notes (Signed)
[facility]    CSN: [id number] Arrival date & time: 10/09/17  1110     History   Chief Complaint Chief Complaint  Patient presents with  . Cough    HPI Danielle Romero is a 37 y.o. female.   HPI  37 year old female resents with a one-week history of cough reductive of yellow phlegm consequential sore throat from so much coughing.  She has had no fever.  She has noticed that her voice is becoming gravelly.  Is [redacted] weeks pregnant.  Is not been using any medications want to make sure that this was not "bronchitis".  He is a smoker.        Past Medical History:  Diagnosis Date  . Breast mass   . Bronchitis   . GERD (gastroesophageal reflux disease)    NO MEDS    Patient Active Problem List   Diagnosis Date Noted  . History of cesarean delivery 10/06/2017  . Advanced maternal age in multigravida 10/06/2017  . Supervision of high risk pregnancy, antepartum 09/29/2017  . Breast mass, left   . Abnormal mammogram of left breast     Past Surgical History:  Procedure Laterality Date  . BREAST BIOPSY Left 08/13/2016   Procedure: left BREAST BIOPSY WITH NEEDLE LOCALIZATION left Moeller's gland excision;  Surgeon: Florene Glen, MD;  Location: [facility] ORS;  Service: General;  Laterality: Left;  . CESAREAN SECTION  2007  . NO PAST SURGERIES     verified per patient 06/22/16    OB History    Gravida  6   Para  4   Term  4   Preterm      AB  1   Living  4     SAB      TAB      Ectopic      Multiple      Live Births  4            Home Medications    Prior to Admission medications   Not on File    Family History Family History  Problem Relation Age of Onset  . Hypertension Mother   . Breast cancer Mother 26  . Kidney disease Mother   . Cirrhosis Father   . Alcohol abuse Father     Social History Social History   Tobacco Use  . Smoking status: Current Every Day Smoker    Packs/day: 0.25    Years: 16.00    Pack years: 4.00     Types: Cigarettes  . Smokeless tobacco: Never Used  . Tobacco comment: 2 CIG PER DAY/trying to quit  Substance Use Topics  . Alcohol use: Not Currently    Comment: Occasional  . Drug use: No     Allergies   Patient has no known allergies.   Review of Systems Review of Systems  Constitutional: Negative for activity change, appetite change, chills, fatigue and fever.  HENT: Positive for congestion and postnasal drip.   Respiratory: Positive for cough. Negative for shortness of breath, wheezing and stridor.   All other systems reviewed and are negative.    Physical Exam Triage Vital Signs ED Triage Vitals  Enc Vitals Group     BP 10/09/17 1134 (!) 145/92     Pulse Rate 10/09/17 1134 85     Resp 10/09/17 1134 18     Temp 10/09/17 1134 98.7 F (37.1 C)     Temp Source 10/09/17 1134 Oral     SpO2 10/09/17  1134 100 %     Weight 10/09/17 1133 235 lb (106.6 kg)     Height 10/09/17 1133 5\' 7"  (1.702 m)     Head Circumference --      Peak Flow --      Pain Score 10/09/17 1133 3     Pain Loc --      Pain Edu? --      Excl. in [place]? --    No data found.  Updated Vital Signs BP (!) 145/92 (BP Location: Right Arm)   Pulse 85   Temp 98.7 F (37.1 C) (Oral)   Resp 18   Ht 5\' 7"  (1.702 m)   Wt 235 lb (106.6 kg)   LMP 08/18/2017   SpO2 100%   BMI 36.81 kg/m   Visual Acuity Right Eye Distance:   Left Eye Distance:   Bilateral Distance:    Right Eye Near:   Left Eye Near:    Bilateral Near:     Physical Exam  Constitutional: She is oriented to person, place, and time. She appears well-developed and well-nourished. No distress.  HENT:  Head: Normocephalic.  Right Ear: External ear normal.  Left Ear: External ear normal.  Nose: Nose normal.  Mouth/Throat: Oropharynx is clear and moist. No oropharyngeal exudate.  Eyes: Pupils are equal, round, and reactive to light. Conjunctivae are normal. Right eye exhibits no discharge. Left eye exhibits no discharge.  Neck:  Normal range of motion.  Pulmonary/Chest: Effort normal and breath sounds normal.  Musculoskeletal: Normal range of motion.  Lymphadenopathy:    She has no cervical adenopathy.  Neurological: She is alert and oriented to person, place, and time.  Skin: Skin is warm and dry. She is not diaphoretic.  Psychiatric: She has a normal mood and affect. Her behavior is normal. Judgment and thought content normal.  Nursing note and vitals reviewed.    UC Treatments / Results  Labs (all labs ordered are listed, but only abnormal results are displayed) Labs Reviewed - No data to display  EKG None Radiology No results found.  Procedures Procedures (including critical care time)  Medications Ordered in UC Medications - No data to display   Initial Impression / Assessment and Plan / UC Course  I have reviewed the triage vital signs and the nursing notes.  Pertinent labs & imaging results that were available during my care of the patient were reviewed by me and considered in my medical decision making (see chart for details).     Plan: 1. Test/x-ray results and diagnosis reviewed with patient 2. rx as per orders; risks, benefits, potential side effects reviewed with patient 3. Recommend supportive treatment with rest and fluids as necessary.  Consulting with the [facility] friends she may use Rhinocort and Robitussin 12 hour cough and cold syrup as necessary in pregnancy.  If she is not improving she should follow-up with her primary care physician or her OB.  Of advised her this is likely a viral illness at this point and does not require antibiotic treatment. 4. F/u prn if symptoms worsen or don't improve   Final Clinical Impressions(s) / UC Diagnoses   Final diagnoses:  Viral upper respiratory tract infection    ED Discharge Orders    None       Controlled Substance Prescriptions [place] Controlled Substance Registry consulted? Not Applicable   Lorin Picket, PA-C 10/09/17  1249

## 2017-10-09 NOTE — Discharge Instructions (Signed)
Get rest and drink plenty of fluids.  May use Rhinocort nasal spray and Robitussin 12-hour cough relief.

## 2017-10-09 NOTE — ED Triage Notes (Addendum)
One week of cough sometimes productive of yellow phlegm. "I cough so hard sometimes my throat gets sore."  No fever. Pt is seven weeks pregnant. Offered strep swab in triage but declined.

## 2017-10-18 ENCOUNTER — Encounter: Payer: Self-pay | Admitting: Maternal Newborn

## 2017-10-18 ENCOUNTER — Ambulatory Visit (INDEPENDENT_AMBULATORY_CARE_PROVIDER_SITE_OTHER): Payer: 59 | Admitting: Maternal Newborn

## 2017-10-18 ENCOUNTER — Encounter: Payer: Self-pay | Admitting: Emergency Medicine

## 2017-10-18 ENCOUNTER — Emergency Department
Admission: EM | Admit: 2017-10-18 | Discharge: 2017-10-18 | Disposition: A | Discharge status: Home / Self Care | Payer: 59 | Attending: Emergency Medicine | Admitting: Emergency Medicine

## 2017-10-18 VITALS — BP 130/90 | Wt 234.0 lb

## 2017-10-18 DIAGNOSIS — O9989 Other specified diseases and conditions complicating pregnancy, childbirth and the puerperium: Secondary | ICD-10-CM | POA: Diagnosis not present

## 2017-10-18 DIAGNOSIS — N390 Urinary tract infection, site not specified: Secondary | ICD-10-CM

## 2017-10-18 DIAGNOSIS — Z5321 Procedure and treatment not carried out due to patient leaving prior to being seen by health care provider: Secondary | ICD-10-CM | POA: Diagnosis not present

## 2017-10-18 DIAGNOSIS — Z3A01 Less than 8 weeks gestation of pregnancy: Secondary | ICD-10-CM | POA: Diagnosis not present

## 2017-10-18 DIAGNOSIS — R1030 Lower abdominal pain, unspecified: Secondary | ICD-10-CM | POA: Insufficient documentation

## 2017-10-18 DIAGNOSIS — O0990 Supervision of high risk pregnancy, unspecified, unspecified trimester: Secondary | ICD-10-CM

## 2017-10-18 LAB — COMPREHENSIVE METABOLIC PANEL
ALT: 15 U/L (ref 14–54)
AST: 17 U/L (ref 15–41)
Albumin: 3.6 g/dL (ref 3.5–5.0)
Alkaline Phosphatase: 103 U/L (ref 38–126)
Anion gap: 7 (ref 5–15)
BUN: 10 mg/dL (ref 6–20)
CHLORIDE: 108 mmol/L (ref 101–111)
CO2: 21 mmol/L — AB (ref 22–32)
CREATININE: 0.5 mg/dL (ref 0.44–1.00)
Calcium: 9 mg/dL (ref 8.9–10.3)
GFR calc Af Amer: >60 mL/min (ref >60)
GFR calc non Af Amer: >60 mL/min (ref >60)
Glucose, Bld: 108 mg/dL — ABNORMAL HIGH (ref 65–99)
Potassium: 3.8 mmol/L (ref 3.5–5.1)
SODIUM: 136 mmol/L (ref 135–145)
Total Bilirubin: 0.4 mg/dL (ref 0.3–1.2)
Total Protein: 7.5 g/dL (ref 6.5–8.1)

## 2017-10-18 LAB — URINALYSIS, COMPLETE (UACMP) WITH MICROSCOPIC
BILIRUBIN URINE: NEGATIVE
Glucose, UA: NEGATIVE mg/dL
Ketones, ur: NEGATIVE mg/dL
Nitrite: POSITIVE — AB
Protein, ur: 30 mg/dL — AB
SPECIFIC GRAVITY, URINE: 1.012 (ref 1.005–1.030)
pH: 7 (ref 5.0–8.0)

## 2017-10-18 LAB — CBC
HCT: 35.3 % (ref 35.0–47.0)
Hemoglobin: 12 g/dL (ref 12.0–16.0)
MCH: 27.3 pg (ref 26.0–34.0)
MCHC: 34 g/dL (ref 32.0–36.0)
MCV: 80.2 fL (ref 80.0–100.0)
PLATELETS: 270 10*3/uL (ref 150–440)
RBC: 4.4 MIL/uL (ref 3.80–5.20)
RDW: 13.3 % (ref 11.5–14.5)
WBC: 11.4 10*3/uL — ABNORMAL HIGH (ref 3.6–11.0)

## 2017-10-18 LAB — LIPASE, BLOOD: LIPASE: 24 U/L (ref 11–51)

## 2017-10-18 MED ORDER — CEPHALEXIN 500 MG PO CAPS: 500.0000 mg | ORAL_CAPSULE | Freq: Two times a day (BID) | ORAL | 0 refills | Status: AC

## 2017-10-18 NOTE — ED Notes (Signed)
Pt up to ask on how long wait is.  Explained that no rooms available right now and working on getting her back as soon as possible.  Pt states "people who came in after me have went back". Informed pt that there are multiple areas to emergency room and some people can go to an area she cannot related to reason of visit.  Ambulatory.  NAD

## 2017-10-18 NOTE — ED Notes (Signed)
Called without answer.

## 2017-10-18 NOTE — Progress Notes (Signed)
    Prenatal Problem Visit  Subjective  Danielle Romero is a 37 y.o. [phone] at [redacted]w[redacted]d being seen today for ongoing prenatal care.  She is currently monitored for the following issues for this high-risk pregnancy and has Breast mass, left; Abnormal mammogram of left breast; Supervision of high risk pregnancy, antepartum; History of cesarean delivery; and Advanced maternal age in multigravida on their problem list.  ----------------------------------------------------------------------------------- Patient reports lower abdominal pain radiating to her back. Her symptoms began on Saturday. The pain is sharp and intermittent. Nothing makes it better or worse. She denies dysuria and vaginal bleeding. Vag. Bleeding: None.  Movement: Absent.  ----------------------------------------------------------------------------------- The following portions of the patient's history were reviewed and updated as appropriate: allergies, current medications, past family history, past medical history, past social history, past surgical history and problem list. Problem list updated.   Objective  Blood pressure 130/90, weight 234 lb (106.1 kg), last menstrual period 08/18/2017. Pregravid weight 235 lb (106.6 kg) Total Weight Gain -1 lb (-0.454 kg) Urinalysis: Done today at hospital. Moderate hemoglobin, moderate leukocytes, positive nitrites, numerous WBC. Fetal Status:     Movement: Absent     General:  Alert, oriented and cooperative. Patient is in no acute distress.  Skin: Skin is warm and dry. No rash noted.   Cardiovascular: Normal heart rate noted  Respiratory: Normal respiratory effort, no problems with respiration noted  Abdomen: Soft, gravid, appropriate for gestational age. Pain/Pressure: Present     Pelvic:  Cervical exam deferred        Extremities: Normal range of motion.     Mental Status: Normal mood and affect. Normal behavior. Normal judgment and thought content.     Assessment   38 y.o.  [name] at [redacted]w[redacted]d, EDD11/13/2019 by Last Menstrual Period presenting for a prenatal problem visit.  Plan   pregnancy Problems (from 09/29/17 to present)    Problem Noted Resolved   History of cesarean delivery 10/06/2017 by Will Bonnet, MD No   Advanced maternal age in multigravida 10/06/2017 by Will Bonnet, MD No   Supervision of high risk pregnancy, antepartum 09/29/2017 by Rod Can, CNM No   Overview Signed 09/29/2017 11:06 AM by Rod Can, [place] Prenatal Labs  Dating  Blood type:     Genetic Screen 1 Screen:    AFP:     Quad:     NIPS: Antibody:   Anatomic [place]  Rubella:   Varicella: @VZVIGG @  GTT Early:               Third trimester:  RPR:     Rhogam  HBsAg:     TDaP vaccine   Flu Shot: declined HIV:     Baby Food                                GBS:   Contraception  Pap:  CBB     CS/VBAC With G4/VBAC G5   Support Person Legrand Como              Rx sent for Keflex based on symptoms and UA. Urine culture sent; will notify patient if change in therapy required.  Keep scheduled ROB appointment.  Avel Sensor, CNM 10/18/2017  4:48 PM

## 2017-10-18 NOTE — ED Triage Notes (Signed)
Pt reports is [redacted] weeks pregnant and started with abdominal pain over the last week that has gotten worse. Pt reports pain is lower abdomen that wraps around into her back. Pt is seen at [facility]. Denies vaginal bleeding.

## 2017-10-20 LAB — URINE CULTURE

## 2017-11-03 ENCOUNTER — Ambulatory Visit (INDEPENDENT_AMBULATORY_CARE_PROVIDER_SITE_OTHER): Payer: 59 | Admitting: Obstetrics and Gynecology

## 2017-11-03 ENCOUNTER — Encounter: Payer: Self-pay | Admitting: Obstetrics and Gynecology

## 2017-11-03 VITALS — BP 112/76 | Wt 233.0 lb

## 2017-11-03 DIAGNOSIS — Z98891 History of uterine scar from previous surgery: Secondary | ICD-10-CM

## 2017-11-03 DIAGNOSIS — O09521 Supervision of elderly multigravida, first trimester: Secondary | ICD-10-CM

## 2017-11-03 DIAGNOSIS — R300 Dysuria: Secondary | ICD-10-CM

## 2017-11-03 DIAGNOSIS — O0990 Supervision of high risk pregnancy, unspecified, unspecified trimester: Secondary | ICD-10-CM

## 2017-11-03 DIAGNOSIS — Z1379 Encounter for other screening for genetic and chromosomal anomalies: Secondary | ICD-10-CM

## 2017-11-03 DIAGNOSIS — N3000 Acute cystitis without hematuria: Secondary | ICD-10-CM

## 2017-11-03 DIAGNOSIS — Z3A11 11 weeks gestation of pregnancy: Secondary | ICD-10-CM

## 2017-11-03 LAB — POCT URINALYSIS DIPSTICK
BILIRUBIN UA: NEGATIVE
Blood, UA: NEGATIVE
Glucose, UA: NEGATIVE
Ketones, UA: NEGATIVE
Nitrite, UA: POSITIVE
PH UA: 6 (ref 5.0–8.0)
Spec Grav, UA: 1.015 (ref 1.010–1.025)
UROBILINOGEN UA: 0.2 U/dL

## 2017-11-03 MED ORDER — NITROFURANTOIN MONOHYD MACRO 100 MG PO CAPS: 100.0000 mg | ORAL_CAPSULE | Freq: Two times a day (BID) | ORAL | 0 refills | Status: DC

## 2017-11-03 NOTE — Progress Notes (Signed)
Pt c/o possible UTI with burning at the end of urination.

## 2017-11-03 NOTE — Progress Notes (Signed)
Routine Prenatal Care Visit  Subjective  Danielle Romero is a 37 y.o. [phone] at [redacted]w[redacted]d being seen today for ongoing prenatal care.  She is currently monitored for the following issues for this high-risk pregnancy and has Breast mass, left; Abnormal mammogram of left breast; Supervision of high risk pregnancy, antepartum; History of cesarean delivery; and Advanced maternal age in multigravida on their problem list.  ----------------------------------------------------------------------------------- Patient reports continued pain with urination.   Contractions: Not present. Vag. Bleeding: None.  Movement: Absent. Denies leaking of fluid.  ----------------------------------------------------------------------------------- The following portions of the patient's history were reviewed and updated as appropriate: allergies, current medications, past family history, past medical history, past social history, past surgical history and problem list. Problem list updated.   Objective  Blood pressure 112/76, weight 233 lb (105.7 kg), last menstrual period 08/18/2017. Pregravid weight 235 lb (106.6 kg) Total Weight Gain -2 lb (-0.907 kg) Urinalysis: Urine Protein: Trace Urine Glucose: Negative  Fetal Status: Fetal Heart Rate (bpm): 159   Movement: Absent     General:  Alert, oriented and cooperative. Patient is in no acute distress.  Skin: Skin is warm and dry. No rash noted.   Cardiovascular: Normal heart rate noted  Respiratory: Normal respiratory effort, no problems with respiration noted  Abdomen: Soft, gravid, appropriate for gestational age. Pain/Pressure: Present     Pelvic:  Cervical exam deferred        Extremities: Normal range of motion.     ental Status: Normal mood and affect. Normal behavior. Normal judgment and thought content.     Assessment   37 y.o. [id number] at [redacted]w[redacted]d by  05/25/2018, by Last Menstrual Period presenting for routine prenatal visit  Plan   pregnancy  Problems (from 09/29/17 to present)    Problem Noted Resolved   History of cesarean delivery 10/06/2017 by Will Bonnet, MD No   Advanced maternal age in multigravida 10/06/2017 by Will Bonnet, MD No   Supervision of high risk pregnancy, antepartum 09/29/2017 by Rod Can, CNM No   Overview Addendum 11/03/2017  8:41 AM by Homero Fellers, [place] Prenatal Labs  Dating  LMP=6week [place] Blood type: A/Positive/-- (03/27 1115)   Genetic Screen  AFP:      NIPS: collected 11/03/17 Antibody:Negative (03/27 1115)  Anatomic [place]  Rubella: 4.84 (03/27 1115) Varicella: Immune  GTT Early: 82               Third trimester:  RPR: Non Reactive (03/27 1115)   Rhogam  Not needed HBsAg: Negative (03/27 1115)   TDaP vaccine   Flu Shot: declined HIV: Non Reactive (03/27 1115)   Baby Food                                GBS:   Contraception  Pap:  CBB     CS/VBAC With G4/VBAC G5   Support Person Legrand Como              Gestational age appropriate obstetric precautions including but not limited to vaginal bleeding, contractions, leaking of fluid and fetal movement were reviewed in detail with the patient.    Materniti21 testing today [place] prenatal classes discussed, given pamphlet.  Pap smear is not up to date, will need at next visit.  Return in about 1 month (around 12/01/2017) for [place].  Adrian Prows MD [facility], [facility] 11/03/2017, 9:00 AM

## 2017-11-07 LAB — URINE CULTURE

## 2017-11-07 NOTE — Progress Notes (Signed)
Patient on macrobid, released to [employer].

## 2017-11-08 LAB — MATERNIT 21 PLUS CORE, BLOOD
CHROMOSOME 18: NEGATIVE
CHROMOSOME 21: NEGATIVE
Chromosome 13: NEGATIVE
Y Chromosome: DETECTED

## 2017-11-08 NOTE — Progress Notes (Signed)
Normal XY released to [employer]

## 2017-12-01 ENCOUNTER — Ambulatory Visit: Payer: 59 | Admitting: Obstetrics & Gynecology

## 2017-12-01 ENCOUNTER — Encounter: Payer: 59 | Admitting: Obstetrics & Gynecology

## 2017-12-01 VITALS — BP 148/90 | Wt 241.0 lb

## 2017-12-01 DIAGNOSIS — O09522 Supervision of elderly multigravida, second trimester: Secondary | ICD-10-CM

## 2017-12-01 DIAGNOSIS — O0990 Supervision of high risk pregnancy, unspecified, unspecified trimester: Secondary | ICD-10-CM

## 2017-12-01 DIAGNOSIS — Z124 Encounter for screening for malignant neoplasm of cervix: Secondary | ICD-10-CM

## 2017-12-01 DIAGNOSIS — Z3A15 15 weeks gestation of pregnancy: Secondary | ICD-10-CM

## 2017-12-01 MED ORDER — CITRANATAL HARMONY 27-1-260 MG PO CAPS: 1.0000 | ORAL_CAPSULE | Freq: Every day | ORAL | 7 refills | Status: DC

## 2017-12-01 NOTE — Progress Notes (Signed)
  Subjective  Fetal Movement? yes Contractions? no Leaking Fluid? no Vaginal Bleeding? no Headaches off and on for 3 weeks    No CP, SOB, epig pain, visual changes Objective  BP (!) 142/90   Wt 241 lb (109.3 kg)   LMP 08/18/2017   BMI 37.75 kg/m  General: NAD Pumonary: no increased work of breathing Abdomen: gravid, non-tender Extremities: no edema Psychiatric: mood appropriate, affect full Cx closed    PAP done Assessment  37 y.o. [id number] at [redacted]w[redacted]d by  05/25/2018, by Last Menstrual Period presenting for routine prenatal visit  Plan   Problem List Items Addressed This Visit      Other   Supervision of high risk pregnancy, antepartum   Advanced maternal age in multigravida    Other Visit Diagnoses    [redacted] weeks gestation of pregnancy    -  Primary   Relevant Orders   US OB Comp + 14 Wk   Screening for cervical cancer       Relevant Orders   Pap IG (Image Guided)    [place] nv Monitor BP and headaches BP check next week Consider meds if sustained Plans VBAC PNV eRx  Barnett Applebaum, MD, [facility], [facility] 12/01/2017  2:40 PM

## 2017-12-01 NOTE — Patient Instructions (Addendum)

## 2017-12-04 LAB — PAP IG (IMAGE GUIDED): PAP SMEAR COMMENT: 0

## 2017-12-07 ENCOUNTER — Encounter: Payer: Self-pay | Admitting: Obstetrics and Gynecology

## 2017-12-07 ENCOUNTER — Ambulatory Visit (INDEPENDENT_AMBULATORY_CARE_PROVIDER_SITE_OTHER): Payer: 59 | Admitting: Obstetrics and Gynecology

## 2017-12-07 VITALS — BP 138/82 | Wt 235.0 lb

## 2017-12-07 DIAGNOSIS — O09522 Supervision of elderly multigravida, second trimester: Secondary | ICD-10-CM

## 2017-12-07 DIAGNOSIS — O10912 Unspecified pre-existing hypertension complicating pregnancy, second trimester: Secondary | ICD-10-CM

## 2017-12-07 DIAGNOSIS — Z98891 History of uterine scar from previous surgery: Secondary | ICD-10-CM

## 2017-12-07 DIAGNOSIS — O0990 Supervision of high risk pregnancy, unspecified, unspecified trimester: Secondary | ICD-10-CM

## 2017-12-07 MED ORDER — LABETALOL HCL 200 MG PO TABS: 200.0000 mg | ORAL_TABLET | Freq: Two times a day (BID) | ORAL | 3 refills | Status: DC

## 2017-12-07 MED ORDER — ASPIRIN EC 81 MG PO TBEC: 81.0000 mg | DELAYED_RELEASE_TABLET | Freq: Every day | ORAL | 2 refills | Status: DC

## 2017-12-07 NOTE — Progress Notes (Signed)
Routine Prenatal Care Visit  Subjective  Danielle Romero is a 37 y.o. [phone] at [redacted]w[redacted]d being seen today for ongoing prenatal care.  She is currently monitored for the following issues for this high-risk pregnancy and has Breast mass, left; Abnormal mammogram of left breast; Supervision of high risk pregnancy, antepartum; History of cesarean delivery; and Advanced maternal age in multigravida on their problem list.  ----------------------------------------------------------------------------------- Patient reports no complaints.  Bps at home throughout the week were in the 130s. Contractions: Not present. Vag. Bleeding: None.  Movement: Present. Denies leaking of fluid.  ----------------------------------------------------------------------------------- The following portions of the patient's history were reviewed and updated as appropriate: allergies, current medications, past family history, past medical history, past social history, past surgical history and problem list. Problem list updated.   Objective  Blood pressure 138/82, weight 235 lb (106.6 kg), last menstrual period 08/18/2017. Pregravid weight 235 lb (106.6 kg) Total Weight Gain 0 lb (0 kg) Urinalysis:      Fetal Status: Fetal Heart Rate (bpm): 155   Movement: Present     General:  Alert, oriented and cooperative. Patient is in no acute distress.  Skin: Skin is warm and dry. No rash noted.   Cardiovascular: Normal heart rate noted  Respiratory: Normal respiratory effort, no problems with respiration noted  Abdomen: Soft, gravid, appropriate for gestational age. Pain/Pressure: Absent     Pelvic:  Cervical exam deferred        Extremities: Normal range of motion.     ental Status: Normal mood and affect. Normal behavior. Normal judgment and thought content.     Assessment   37 y.o. [id number] at [redacted]w[redacted]d by  05/25/2018, by Last Menstrual Period presenting for routine prenatal visit  Plan   pregnancy Problems (from  09/29/17 to present)    Problem Noted Resolved   History of cesarean delivery 10/06/2017 by Will Bonnet, MD No   Advanced maternal age in multigravida 10/06/2017 by Will Bonnet, MD No   Supervision of high risk pregnancy, antepartum 09/29/2017 by Rod Can, CNM No   Overview Addendum 11/03/2017  8:41 AM by Homero Fellers, [place] Prenatal Labs  Dating  LMP=6week [place] Blood type: A/Positive/-- (03/27 1115)   Genetic Screen  AFP:      NIPS: collected 11/03/17 Antibody:Negative (03/27 1115)  Anatomic [place]  Rubella: 4.84 (03/27 1115) Varicella: Immune  GTT Early: 50               Third trimester:  RPR: Non Reactive (03/27 1115)   Rhogam  Not needed HBsAg: Negative (03/27 1115)   TDaP vaccine   Flu Shot: declined HIV: Non Reactive (03/27 1115)   Baby Food                                GBS:   Contraception  Pap:  CBB     CS/VBAC With G4/VBAC G5   Support Person Legrand Como               Gestational age appropriate obstetric precautions including but not limited to vaginal bleeding, contractions, leaking of fluid and fetal movement were reviewed in detail with the patient.    Persistently elevated blood pressures- will start Labetalol 200mg  BID and daily 81 mg ASA. Follow up in 2 weeks for BP check  Return in about 2 weeks (around 12/21/2017) for ROB- BP check.  Adrian Prows MD [facility],  [place] 12/07/17 4:40 PM

## 2017-12-21 ENCOUNTER — Ambulatory Visit (INDEPENDENT_AMBULATORY_CARE_PROVIDER_SITE_OTHER): Payer: 59 | Admitting: Obstetrics and Gynecology

## 2017-12-21 VITALS — BP 108/68 | Wt 235.0 lb

## 2017-12-21 DIAGNOSIS — O09522 Supervision of elderly multigravida, second trimester: Secondary | ICD-10-CM

## 2017-12-21 DIAGNOSIS — O10912 Unspecified pre-existing hypertension complicating pregnancy, second trimester: Secondary | ICD-10-CM

## 2017-12-21 DIAGNOSIS — Z3A17 17 weeks gestation of pregnancy: Secondary | ICD-10-CM

## 2017-12-21 DIAGNOSIS — Z98891 History of uterine scar from previous surgery: Secondary | ICD-10-CM

## 2017-12-21 DIAGNOSIS — O0990 Supervision of high risk pregnancy, unspecified, unspecified trimester: Secondary | ICD-10-CM

## 2017-12-21 DIAGNOSIS — O10913 Unspecified pre-existing hypertension complicating pregnancy, third trimester: Secondary | ICD-10-CM | POA: Insufficient documentation

## 2017-12-21 NOTE — Progress Notes (Signed)
Doing well, taking BP meds for last week. This has improved the swelling in her feet.      Routine Prenatal Care Visit  Subjective  Danielle Romero is a 37 y.o. [phone] at [redacted]w[redacted]d being seen today for ongoing prenatal care.  She is currently monitored for the following issues for this high-risk pregnancy and has Breast mass, left; Abnormal mammogram of left breast; Supervision of high risk pregnancy, antepartum; History of cesarean delivery; Advanced maternal age in multigravida; and Maternal chronic hypertension in second trimester on their problem list.  ----------------------------------------------------------------------------------- Patient reports no complaints.   Contractions: Not present. Vag. Bleeding: None.  Movement: Present. Denies leaking of fluid.  ----------------------------------------------------------------------------------- The following portions of the patient's history were reviewed and updated as appropriate: allergies, current medications, past family history, past medical history, past social history, past surgical history and problem list. Problem list updated.   Objective  Blood pressure 108/68, weight 235 lb (106.6 kg), last menstrual period 08/18/2017. Pregravid weight 235 lb (106.6 kg) Total Weight Gain 0 lb (0 kg) Urinalysis: Urine Protein: Negative Urine Glucose: Negative  Fetal Status: Fetal Heart Rate (bpm): 152   Movement: Present     General:  Alert, oriented and cooperative. Patient is in no acute distress.  Skin: Skin is warm and dry. No rash noted.   Cardiovascular: Normal heart rate noted  Respiratory: Normal respiratory effort, no problems with respiration noted  Abdomen: Soft, gravid, appropriate for gestational age. Pain/Pressure: Absent     Pelvic:  Cervical exam deferred        Extremities: Normal range of motion.  Edema: None  ental Status: Normal mood and affect. Normal behavior. Normal judgment and thought content.     Assessment    37 y.o. [id number] at [redacted]w[redacted]d by  05/25/2018, by Last Menstrual Period presenting for routine prenatal visit  Plan   pregnancy Problems (from 09/29/17 to present)    Problem Noted Resolved   Maternal chronic hypertension in second trimester 12/21/2017 by Homero Fellers, MD No   Overview Signed 12/21/2017  9:41 AM by Homero Fellers, MD    Labetalol 200mg  BID      History of cesarean delivery 10/06/2017 by Will Bonnet, MD No   Advanced maternal age in multigravida 10/06/2017 by Will Bonnet, MD No   Supervision of high risk pregnancy, antepartum 09/29/2017 by Rod Can, CNM No   Overview Addendum 11/03/2017  8:41 AM by Homero Fellers, [place] Prenatal Labs  Dating  LMP=6week [place] Blood type: A/Positive/-- (03/27 1115)   Genetic Screen  AFP:      NIPS: collected 11/03/17 Antibody:Negative (03/27 1115)  Anatomic [place]  Rubella: 4.84 (03/27 1115) Varicella: Immune  GTT Early: 67               Third trimester:  RPR: Non Reactive (03/27 1115)   Rhogam  Not needed HBsAg: Negative (03/27 1115)   TDaP vaccine   Flu Shot: declined HIV: Non Reactive (03/27 1115)   Baby Food                                GBS:   Contraception  Pap:  CBB     CS/VBAC With G4/VBAC G5   Support Person Legrand Como               Gestational age appropriate obstetric precautions including but not limited to vaginal bleeding, contractions, leaking  of fluid and fetal movement were reviewed in detail with the patient.    Blood pressures well controlled, continue with labetalol and aspirin. Return in about 1 week (around 12/28/2017) for [place] next  week as scheduled.  Adrian Prows MD [facility], [facility] 12/21/17 9:47 AM

## 2017-12-21 NOTE — Progress Notes (Signed)
Pt reports no problems. BP check today.

## 2017-12-29 ENCOUNTER — Ambulatory Visit (INDEPENDENT_AMBULATORY_CARE_PROVIDER_SITE_OTHER): Payer: 59 | Admitting: Obstetrics and Gynecology

## 2017-12-29 ENCOUNTER — Ambulatory Visit (INDEPENDENT_AMBULATORY_CARE_PROVIDER_SITE_OTHER): Payer: 59

## 2017-12-29 VITALS — BP 112/72 | Wt 236.0 lb

## 2017-12-29 DIAGNOSIS — O34219 Maternal care for unspecified type scar from previous cesarean delivery: Secondary | ICD-10-CM

## 2017-12-29 DIAGNOSIS — Z3A15 15 weeks gestation of pregnancy: Secondary | ICD-10-CM

## 2017-12-29 DIAGNOSIS — Z363 Encounter for antenatal screening for malformations: Secondary | ICD-10-CM

## 2017-12-29 DIAGNOSIS — IMO0002 Reserved for concepts with insufficient information to code with codable children: Secondary | ICD-10-CM

## 2017-12-29 DIAGNOSIS — Z3A19 19 weeks gestation of pregnancy: Secondary | ICD-10-CM

## 2017-12-29 DIAGNOSIS — O09522 Supervision of elderly multigravida, second trimester: Secondary | ICD-10-CM

## 2017-12-29 DIAGNOSIS — Z98891 History of uterine scar from previous surgery: Secondary | ICD-10-CM

## 2017-12-29 DIAGNOSIS — N6320 Unspecified lump in the left breast, unspecified quadrant: Secondary | ICD-10-CM

## 2017-12-29 DIAGNOSIS — O10912 Unspecified pre-existing hypertension complicating pregnancy, second trimester: Secondary | ICD-10-CM

## 2017-12-29 DIAGNOSIS — Z0489 Encounter for examination and observation for other specified reasons: Secondary | ICD-10-CM

## 2017-12-29 DIAGNOSIS — O9989 Other specified diseases and conditions complicating pregnancy, childbirth and the puerperium: Secondary | ICD-10-CM

## 2017-12-29 DIAGNOSIS — O0990 Supervision of high risk pregnancy, unspecified, unspecified trimester: Secondary | ICD-10-CM

## 2017-12-29 NOTE — Progress Notes (Signed)
Routine Prenatal Care Visit  Subjective  Danielle Romero is a 37 y.o. [id number] at [redacted]w[redacted]d being seen today for ongoing prenatal care.  She is currently monitored for the following issues for this high-risk pregnancy and has Breast mass, left; Abnormal mammogram of left breast; Supervision of high risk pregnancy, antepartum; History of cesarean delivery; Advanced maternal age in multigravida; and Maternal chronic hypertension in second trimester on their problem list.  ----------------------------------------------------------------------------------- Patient reports no complaints.   Contractions: Not present. Vag. Bleeding: None.  Movement: Present. Denies leaking of fluid.  ----------------------------------------------------------------------------------- The following portions of the patient's history were reviewed and updated as appropriate: allergies, current medications, past family history, past medical history, past social history, past surgical history and problem list. Problem list updated.   Objective  Blood pressure 112/72, weight 236 lb (107 kg), last menstrual period 08/18/2017. Pregravid weight 235 lb (106.6 kg) Total Weight Gain 1 lb (0.454 kg) Urinalysis: Urine Protein: Negative Urine Glucose: Negative  Fetal Status: Fetal Heart Rate (bpm): 150   Movement: Present     General:  Alert, oriented and cooperative. Patient is in no acute distress.  Skin: Skin is warm and dry. No rash noted.   Cardiovascular: Normal heart rate noted  Respiratory: Normal respiratory effort, no problems with respiration noted  Abdomen: Soft, gravid, appropriate for gestational age. Pain/Pressure: Absent     Pelvic:  Cervical exam deferred        Extremities: Normal range of motion.     ental Status: Normal mood and affect. Normal behavior. Normal judgment and thought content.     Assessment   37 y.o. [id number] at [redacted]w[redacted]d by  05/25/2018, by Last Menstrual Period presenting for routine prenatal  visit  Plan   pregnancy Problems (from 09/29/17 to present)    Problem Noted Resolved   Maternal chronic hypertension in second trimester 12/21/2017 by Homero Fellers, MD No   Overview Addendum 12/21/2017  9:48 AM by Homero Fellers, MD    Labetalol 200mg  BID Daily 81 mg aspirin      History of cesarean delivery 10/06/2017 by Will Bonnet, MD No   Advanced maternal age in multigravida 10/06/2017 by Will Bonnet, MD No   Supervision of high risk pregnancy, antepartum 09/29/2017 by Rod Can, CNM No   Overview Addendum 11/03/2017  8:41 AM by Homero Fellers, [place] Prenatal Labs  Dating  LMP=6week [place] Blood type: A/Positive/-- (03/27 1115)   Genetic Screen NIPS: collected 11/03/17 Antibody:Negative (03/27 1115)  Anatomic [place] Female, incomplete face brain [ ]  24 week F/U  Rubella: 4.84 (03/27 1115) Varicella: Immune  GTT Early: 109               Third trimester:  RPR: Non Reactive (03/27 1115)   Rhogam  Not needed HBsAg: Negative (03/27 1115)   TDaP vaccine   Flu Shot: declined HIV: Non Reactive (03/27 1115)   Baby Food                                GBS:   Contraception  Pap:  CBB     CS/VBAC With G4/VBAC G5   Support Person Legrand Como               Gestational age appropriate obstetric precautions including but not limited to vaginal bleeding, contractions, leaking of fluid and fetal movement were reviewed in detail with the patient.  -  F/U anatomy in 4 weeks   Return in about 1 month (around 01/26/2018) for ROB and follow up anatomy scan.  Malachy Mood, MD, [facility], [facility] 12/29/2017, 9:51 AM

## 2017-12-29 NOTE — Progress Notes (Signed)
ROB Anatomy scan today/ It is a BOY!!

## 2018-01-26 ENCOUNTER — Ambulatory Visit (INDEPENDENT_AMBULATORY_CARE_PROVIDER_SITE_OTHER): Payer: 59 | Admitting: Maternal Newborn

## 2018-01-26 ENCOUNTER — Ambulatory Visit (INDEPENDENT_AMBULATORY_CARE_PROVIDER_SITE_OTHER): Payer: 59

## 2018-01-26 ENCOUNTER — Encounter: Payer: Self-pay | Admitting: Maternal Newborn

## 2018-01-26 VITALS — BP 124/80 | Wt 242.0 lb

## 2018-01-26 DIAGNOSIS — Z3689 Encounter for other specified antenatal screening: Secondary | ICD-10-CM

## 2018-01-26 DIAGNOSIS — Z98891 History of uterine scar from previous surgery: Secondary | ICD-10-CM

## 2018-01-26 DIAGNOSIS — O09522 Supervision of elderly multigravida, second trimester: Secondary | ICD-10-CM

## 2018-01-26 DIAGNOSIS — R12 Heartburn: Secondary | ICD-10-CM

## 2018-01-26 DIAGNOSIS — Z0489 Encounter for examination and observation for other specified reasons: Secondary | ICD-10-CM

## 2018-01-26 DIAGNOSIS — Z3A23 23 weeks gestation of pregnancy: Secondary | ICD-10-CM | POA: Diagnosis not present

## 2018-01-26 DIAGNOSIS — O10912 Unspecified pre-existing hypertension complicating pregnancy, second trimester: Secondary | ICD-10-CM | POA: Diagnosis not present

## 2018-01-26 DIAGNOSIS — O0990 Supervision of high risk pregnancy, unspecified, unspecified trimester: Secondary | ICD-10-CM

## 2018-01-26 DIAGNOSIS — O34219 Maternal care for unspecified type scar from previous cesarean delivery: Secondary | ICD-10-CM | POA: Diagnosis not present

## 2018-01-26 DIAGNOSIS — O26892 Other specified pregnancy related conditions, second trimester: Secondary | ICD-10-CM

## 2018-01-26 DIAGNOSIS — IMO0002 Reserved for concepts with insufficient information to code with codable children: Secondary | ICD-10-CM

## 2018-01-26 MED ORDER — RANITIDINE HCL 150 MG PO TABS: 150.0000 mg | ORAL_TABLET | Freq: Two times a day (BID) | ORAL | 11 refills | Status: DC

## 2018-01-26 NOTE — Progress Notes (Signed)
No concerns.rj 

## 2018-01-26 NOTE — Progress Notes (Signed)
Routine Prenatal Care Visit  Subjective  Danielle Romero is a 37 y.o. [phone] at [redacted]w[redacted]d being seen today for ongoing prenatal care.  She is currently monitored for the following issues for this high-risk pregnancy and has Breast mass, left; Abnormal mammogram of left breast; Supervision of high risk pregnancy, antepartum; History of cesarean delivery; Advanced maternal age in multigravida; and Maternal chronic hypertension in second trimester on their problem list.  ----------------------------------------------------------------------------------- Patient reports some Braxton-Hicks contractions. She is experiencing heartburn more often.   Contractions: Not present. Vag. Bleeding: None.  Movement: Present. No leaking of fluid.  ----------------------------------------------------------------------------------- The following portions of the patient's history were reviewed and updated as appropriate: allergies, current medications, past family history, past medical history, past social history, past surgical history and problem list. Problem list updated.  Objective  Blood pressure 124/80, weight 242 lb (109.8 kg), last menstrual period 08/18/2017. Pregravid weight 235 lb (106.6 kg) Total Weight Gain 7 lb (3.175 kg) Urinalysis: Urine Protein: Negative Urine Glucose: Negative  Fetal Status: Fetal Heart Rate (bpm): 141   Movement: Present     General:  Alert, oriented and cooperative. Patient is in no acute distress.  Skin: Skin is warm and dry. No rash noted.   Cardiovascular: Normal heart rate noted  Respiratory: Normal respiratory effort, no problems with respiration noted  Abdomen: Soft, gravid, appropriate for gestational age. Pain/Pressure: Absent     Pelvic:  Cervical exam deferred        Extremities: Normal range of motion.     Mental Status: Normal mood and affect. Normal behavior. Normal judgment and thought content.    Assessment   37 y.o. Danielle Romero at [redacted]w[redacted]d, EDD 05/25/2018 by  Last Menstrual Period presenting for routine prenatal visit.  Plan   pregnancy Problems (from 09/29/17 to present)    Problem Noted Resolved   Maternal chronic hypertension in second trimester 12/21/2017 by Homero Fellers, MD No   Overview Addendum 12/21/2017  9:48 AM by Homero Fellers, MD    Labetalol 200mg  BID Daily 81 mg aspirin      History of cesarean delivery 10/06/2017 by Will Bonnet, MD No   Advanced maternal age in multigravida 10/06/2017 by Will Bonnet, MD No   Supervision of high risk pregnancy, antepartum 09/29/2017 by Rod Can, CNM No   Overview Addendum 12/29/2017  9:51 AM by Malachy Mood, [place] Prenatal Labs  Dating  LMP=6week [place] Blood type: A/Positive/-- (03/27 1115)   Genetic Screen NIPS: collected 11/03/17 Antibody:Negative (03/27 1115)  Anatomic [place] Female, incomplete face brain [ ]  24 week F/U Rubella: 4.84 (03/27 1115) Varicella: Immune  GTT Early: 109               Third trimester:  RPR: Non Reactive (03/27 1115)   Rhogam  Not needed HBsAg: Negative (03/27 1115)   TDaP vaccine   Flu Shot: declined HIV: Non Reactive (03/27 1115)   Baby Food                                GBS:   Contraception  Pap:  CBB     CS/VBAC With G4/VBAC G5   Support Person Legrand Como            Anatomy scan complete today. Rx sent for Zantac due to more frequent heartburn.  Preterm labor symptoms and general obstetric precautions were reviewed.  Return in about 1  month (around 02/23/2018) for ROB and 28 week labs/GTT and growth scan.  Avel Sensor, CNM 01/26/2018  10:37 AM

## 2018-02-21 ENCOUNTER — Ambulatory Visit (INDEPENDENT_AMBULATORY_CARE_PROVIDER_SITE_OTHER): Payer: 59 | Admitting: Maternal Newborn

## 2018-02-21 ENCOUNTER — Telehealth: Payer: Self-pay

## 2018-02-21 ENCOUNTER — Encounter: Payer: 59 | Admitting: Obstetrics and Gynecology

## 2018-02-21 ENCOUNTER — Encounter: Payer: Self-pay | Admitting: Maternal Newborn

## 2018-02-21 VITALS — BP 138/84 | Wt 244.0 lb

## 2018-02-21 DIAGNOSIS — O34219 Maternal care for unspecified type scar from previous cesarean delivery: Secondary | ICD-10-CM

## 2018-02-21 DIAGNOSIS — O0990 Supervision of high risk pregnancy, unspecified, unspecified trimester: Secondary | ICD-10-CM

## 2018-02-21 DIAGNOSIS — O09522 Supervision of elderly multigravida, second trimester: Secondary | ICD-10-CM

## 2018-02-21 DIAGNOSIS — O2342 Unspecified infection of urinary tract in pregnancy, second trimester: Secondary | ICD-10-CM

## 2018-02-21 DIAGNOSIS — O2340 Unspecified infection of urinary tract in pregnancy, unspecified trimester: Secondary | ICD-10-CM

## 2018-02-21 DIAGNOSIS — Z3A26 26 weeks gestation of pregnancy: Secondary | ICD-10-CM

## 2018-02-21 LAB — POCT URINALYSIS DIPSTICK
BILIRUBIN UA: NEGATIVE
Glucose, UA: NEGATIVE
Ketones, UA: NEGATIVE
Leukocytes, UA: NEGATIVE
Nitrite, UA: POSITIVE
PH UA: 6 (ref 5.0–8.0)
Protein, UA: NEGATIVE
RBC UA: NEGATIVE
Spec Grav, UA: 1.025 (ref 1.010–1.025)
UROBILINOGEN UA: 0.2 U/dL

## 2018-02-21 MED ORDER — CEPHALEXIN 500 MG PO CAPS: 500.0000 mg | ORAL_CAPSULE | Freq: Two times a day (BID) | ORAL | 0 refills | Status: AC

## 2018-02-21 NOTE — Telephone Encounter (Signed)
Pt c/o pain in lower abdomen around to her back where there is a burning sensation.  Should she go to the ED or is this normal?  [phone]  Adv pt to be seen today.  Txd to SP to schedule.

## 2018-02-21 NOTE — Progress Notes (Signed)
Prenatal Problem Visit  Subjective  Danielle Romero is a 37 y.o. [phone] at [redacted]w[redacted]d being seen today for ongoing prenatal care.  She is currently monitored for the following issues for this high-risk pregnancy and has Breast mass, left; Abnormal mammogram of left breast; Supervision of high risk pregnancy, antepartum; History of cesarean delivery; Advanced maternal age in multigravida; Maternal chronic hypertension in second trimester; and Heartburn during pregnancy in second trimester on their problem list.  ----------------------------------------------------------------------------------- Patient reports burning pain in her lower back on the right side. Some uterine irritability and lower abdominal pain as well..   Contractions: Irritability. Vag. Bleeding: None.  Movement: Present. No leaking of fluid.  ----------------------------------------------------------------------------------- The following portions of the patient's history were reviewed and updated as appropriate: allergies, current medications, past family history, past medical history, past social history, past surgical history and problem list. Problem list updated.   Objective  Blood pressure 138/84, weight 244 lb (110.7 kg), last menstrual period 08/18/2017. Pregravid weight 235 lb (106.6 kg) Total Weight Gain 9 lb (4.082 kg) Urinalysis: Positive Nitrites, Negative Glucose and Protein Fetal Status: Fetal Heart Rate (bpm): 153   Movement: Present     General:  Alert, oriented and cooperative. Patient is in no acute distress.  Skin: Skin is warm and dry. No rash noted.   Cardiovascular: Normal heart rate noted  Respiratory: Normal respiratory effort, no problems with respiration noted  Abdomen: Soft, gravid, appropriate for gestational age. Pain/Pressure: Present     Pelvic:  Cervical exam deferred        Extremities: Normal range of motion.     Mental Status: Normal mood and affect. Normal behavior. Normal judgment and  thought content.     Assessment   37 y.o. [id number] at [redacted]w[redacted]d, EDD 05/25/2018 by Last Menstrual Period presenting for work-in prenatal visit.  Plan   pregnancy Problems (from 09/29/17 to present)    Problem Noted Resolved   Maternal chronic hypertension in second trimester 12/21/2017 by Homero Fellers, MD No   Overview Addendum 01/26/2018  9:21 AM by Rexene Agent, CNM    Labetalol 200mg  BID Daily 81 mg aspirin  [x ] Aspirin 81 mg daily after 12 weeks; discontinue after 36 weeks [x ] baseline labs with CBC, CMP, urine protein/creatinine ratio [ ]  ultrasound for growth at 28, 32, 36 weeks [ ]  Baseline EKG   Current antihypertensives:  Labetalol   Baseline and surveillance labs (pulled in from [facility], refresh links as needed)  Lab Results  Component Value Date   PLT 270 10/18/2017   CREATININE 0.50 10/18/2017   AST 17 10/18/2017   ALT 15 10/18/2017    Antenatal Testing CHTN - O10.919  Group I  BP < 140/90, no preeclampsia, AGA,  nml AFV, +/- meds    Group II BP > 140/90, on meds, no preeclampsia, AGA, nml AFV  20-28-34-38  20-24-28-32-35-38  32//2 x wk  28//BPP wkly then 32//2 x wk  40 no meds; 39 meds  PRN or 37  Pre-eclampsia  GHTN - O13.9/Preeclampsia without severe features  - O14.00   Preeclampsia with severe features - O14.10  Q 3-4wks  Q 2 wks  28//BPP wkly then 32//2 x wk  Inpatient  37  PRN or 34        History of cesarean delivery 10/06/2017 by Will Bonnet, MD No   Advanced maternal age in multigravida 10/06/2017 by Will Bonnet, MD No   Supervision of high risk pregnancy, antepartum 09/29/2017  by Rod Can, CNM No   Overview Addendum 01/26/2018  9:23 AM by Rexene Agent, [place] Prenatal Labs  Dating  LMP=6week [place] Blood type: A/Positive/-- (03/27 1115)   Genetic Screen NIPS: collected 11/03/17 Antibody:Negative (03/27 1115)  Anatomic [place] Complete 01/26/2018 Rubella: 4.84 (03/27 1115) Varicella: Immune    GTT Early: 18               Third trimester:  RPR: Non Reactive (03/27 1115)   Rhogam  Not needed HBsAg: Negative (03/27 1115)   TDaP vaccine   Flu Shot: declined HIV: Non Reactive (03/27 1115)   Baby Food                                GBS:   Contraception  Pap:  CBB     CS/VBAC With G4/VBAC G5   Support Person Legrand Como              Will treat empirically with Keflex; UA positive nitrites and symptoms similar to previous UTI. Will notify her if urine culture results require change in therapy.  Keep scheduled ROB for 28 weeks.  Avel Sensor, CNM 02/21/2018  3:13 PM

## 2018-02-23 ENCOUNTER — Other Ambulatory Visit: Payer: 59

## 2018-02-23 ENCOUNTER — Ambulatory Visit (INDEPENDENT_AMBULATORY_CARE_PROVIDER_SITE_OTHER): Payer: 59

## 2018-02-23 ENCOUNTER — Encounter: Payer: Self-pay | Admitting: Certified Nurse Midwife

## 2018-02-23 ENCOUNTER — Ambulatory Visit (INDEPENDENT_AMBULATORY_CARE_PROVIDER_SITE_OTHER): Payer: 59 | Admitting: Certified Nurse Midwife

## 2018-02-23 VITALS — BP 130/76 | Wt 240.0 lb

## 2018-02-23 DIAGNOSIS — Z3A27 27 weeks gestation of pregnancy: Secondary | ICD-10-CM

## 2018-02-23 DIAGNOSIS — O0990 Supervision of high risk pregnancy, unspecified, unspecified trimester: Secondary | ICD-10-CM

## 2018-02-23 DIAGNOSIS — Z3689 Encounter for other specified antenatal screening: Secondary | ICD-10-CM

## 2018-02-23 DIAGNOSIS — O10912 Unspecified pre-existing hypertension complicating pregnancy, second trimester: Secondary | ICD-10-CM

## 2018-02-23 LAB — POCT URINALYSIS DIPSTICK OB
Glucose, UA: NEGATIVE — AB
PROTEIN: NEGATIVE

## 2018-02-23 LAB — URINE CULTURE

## 2018-02-23 NOTE — Progress Notes (Signed)
HROB at 27 weeks: Feeling better since starting of Keflex for a suspected UTI. Urine culture still pending. 28 week labs drawn today Will be breast feeding. +FM Growth scan today: EFW 58th% (2#6oz)/ breech/ fundal placenta CHTN: BP 130/76. Not currently on antihypertensive medication. Advised to restart baby ASA daily. Is taking blood pressures at home. Advised to notify us if BP>140/90 ROB in 2 weeks  Dalia Heading, CNM

## 2018-02-23 NOTE — Progress Notes (Signed)
No concerns.rj 

## 2018-02-24 LAB — 28 WEEK RH+PANEL
BASOS: 0 %
Basophils Absolute: 0 10*3/uL (ref 0.0–0.2)
EOS (ABSOLUTE): 0.1 10*3/uL (ref 0.0–0.4)
Eos: 1 %
GESTATIONAL DIABETES SCREEN: 125 mg/dL (ref 65–139)
HEMATOCRIT: 29.1 % — AB (ref 34.0–46.6)
HEMOGLOBIN: 9.7 g/dL — AB (ref 11.1–15.9)
HIV Screen 4th Generation wRfx: NONREACTIVE
Immature Grans (Abs): 0 10*3/uL (ref 0.0–0.1)
Immature Granulocytes: 0 %
LYMPHS ABS: 1.5 10*3/uL (ref 0.7–3.1)
Lymphs: 16 %
MCH: 25.7 pg — AB (ref 26.6–33.0)
MCHC: 33.3 g/dL (ref 31.5–35.7)
MCV: 77 fL — ABNORMAL LOW (ref 79–97)
MONOS ABS: 0.4 10*3/uL (ref 0.1–0.9)
Monocytes: 5 %
Neutrophils Absolute: 7.2 10*3/uL — ABNORMAL HIGH (ref 1.4–7.0)
Neutrophils: 78 %
Platelets: 220 10*3/uL (ref 150–450)
RBC: 3.77 x10E6/uL (ref 3.77–5.28)
RDW: 15.6 % — AB (ref 12.3–15.4)
RPR Ser Ql: NONREACTIVE
WBC: 9.2 10*3/uL (ref 3.4–10.8)

## 2018-02-25 ENCOUNTER — Other Ambulatory Visit: Payer: Self-pay | Admitting: Maternal Newborn

## 2018-02-25 ENCOUNTER — Encounter: Payer: Self-pay | Admitting: Maternal Newborn

## 2018-02-25 DIAGNOSIS — O99013 Anemia complicating pregnancy, third trimester: Secondary | ICD-10-CM

## 2018-02-25 MED ORDER — FERROUS SULFATE 325 (65 FE) MG PO TABS: 325.0000 mg | ORAL_TABLET | Freq: Two times a day (BID) | ORAL | 4 refills | Status: DC

## 2018-02-25 NOTE — Progress Notes (Signed)
Rx sent for ferrous sulfate.

## 2018-03-09 ENCOUNTER — Encounter: Payer: Self-pay | Admitting: Advanced Practice Midwife

## 2018-03-09 ENCOUNTER — Ambulatory Visit (INDEPENDENT_AMBULATORY_CARE_PROVIDER_SITE_OTHER): Payer: 59 | Admitting: Advanced Practice Midwife

## 2018-03-09 VITALS — BP 110/66 | Wt 243.0 lb

## 2018-03-09 DIAGNOSIS — O09523 Supervision of elderly multigravida, third trimester: Secondary | ICD-10-CM

## 2018-03-09 DIAGNOSIS — Z3A29 29 weeks gestation of pregnancy: Secondary | ICD-10-CM

## 2018-03-09 LAB — POCT URINALYSIS DIPSTICK OB
Glucose, UA: NEGATIVE
PROTEIN: NEGATIVE

## 2018-03-09 NOTE — Patient Instructions (Signed)

## 2018-03-09 NOTE — Progress Notes (Signed)
ROB Constipation

## 2018-03-09 NOTE — Progress Notes (Signed)
Routine Prenatal Care Visit  Subjective  Danielle Romero is a 37 y.o. [id number] at [redacted]w[redacted]d being seen today for ongoing prenatal care.  She is currently monitored for the following issues for this high-risk pregnancy and has Breast mass, left; Abnormal mammogram of left breast; Supervision of high risk pregnancy, antepartum; History of cesarean delivery; Advanced maternal age in multigravida; Maternal chronic hypertension in second trimester; and Heartburn during pregnancy in second trimester on their problem list.  ----------------------------------------------------------------------------------- Patient reports urinary symptoms improved. Urine culture was positive for UTI. Taking Fe supplement. Having constipation. Encouraged increased hydration, fiber, activity.   Contractions: Not present. Vag. Bleeding: None.  Movement: Present. Denies leaking of fluid.  ----------------------------------------------------------------------------------- The following portions of the patient's history were reviewed and updated as appropriate: allergies, current medications, past family history, past medical history, past social history, past surgical history and problem list. Problem list updated.   Objective  Blood pressure 110/66, weight 243 lb (110.2 kg), last menstrual period 08/18/2017. Pregravid weight 235 lb (106.6 kg) Total Weight Gain 8 lb (3.629 kg) Urinalysis: Protein: negative Glucose: negative  Fetal Status: Fetal Heart Rate (bpm): 145 Fundal Height: 30 cm Movement: Present     General:  Alert, oriented and cooperative. Patient is in no acute distress.  Skin: Skin is warm and dry. No rash noted.   Cardiovascular: Normal heart rate noted  Respiratory: Normal respiratory effort, no problems with respiration noted  Abdomen: Soft, gravid, appropriate for gestational age. Pain/Pressure: Absent     Pelvic:  Cervical exam deferred        Extremities: Normal range of motion.  Edema: Trace  Mental  Status: Normal mood and affect. Normal behavior. Normal judgment and thought content.   Assessment   37 y.o. [id number] at [redacted]w[redacted]d by  05/25/2018, by Last Menstrual Period presenting for routine prenatal visit  Plan   pregnancy Problems (from 09/29/17 to present)    Problem Noted Resolved   Maternal chronic hypertension in second trimester 12/21/2017 by Homero Fellers, MD No   Overview Addendum 01/26/2018  9:21 AM by Rexene Agent, CNM    Labetalol 200mg  BID Daily 81 mg aspirin  [x ] Aspirin 81 mg daily after 12 weeks; discontinue after 36 weeks [x ] baseline labs with CBC, CMP, urine protein/creatinine ratio [ ]  ultrasound for growth at 28, 32, 36 weeks [ ]  Baseline EKG   Current antihypertensives:  Labetalol   Baseline and surveillance labs (pulled in from [facility], refresh links as needed)  Lab Results  Component Value Date   PLT 270 10/18/2017   CREATININE 0.50 10/18/2017   AST 17 10/18/2017   ALT 15 10/18/2017    Antenatal Testing CHTN - O10.919  Group I  BP < 140/90, no preeclampsia, AGA,  nml AFV, +/- meds    Group II BP > 140/90, on meds, no preeclampsia, AGA, nml AFV  20-28-34-38  20-24-28-32-35-38  32//2 x wk  28//BPP wkly then 32//2 x wk  40 no meds; 39 meds  PRN or 37  Pre-eclampsia  GHTN - O13.9/Preeclampsia without severe features  - O14.00   Preeclampsia with severe features - O14.10  Q 3-4wks  Q 2 wks  28//BPP wkly then 32//2 x wk  Inpatient  37  PRN or 34        History of cesarean delivery 10/06/2017 by Will Bonnet, MD No   Advanced maternal age in multigravida 10/06/2017 by Will Bonnet, MD No   Supervision of high risk pregnancy, antepartum 09/29/2017  by Rod Can, CNM No   Overview Addendum 02/23/2018 10:55 AM by Dalia Heading, [place] Prenatal Labs  Dating  LMP=6week [place] Blood type: A/Positive/-- (03/27 1115)   Genetic Screen NIPS: collected 11/03/17 Antibody:Negative (03/27 1115)  Anatomic [place]  Complete 01/26/2018 Rubella: 4.84 (03/27 1115) Varicella: Immune  GTT Early: 109               Third trimester:  RPR: Non Reactive (03/27 1115)   Rhogam  Not needed HBsAg: Negative (03/27 1115)   TDaP vaccine   Flu Shot: declined HIV: Non Reactive (03/27 1115)   Baby Food         Breast                       GBS:   Contraception  Pap:NIL 12/01/2017  CBB     CS/VBAC With G4/VBAC G5   Support Person Michael               Preterm labor symptoms and general obstetric precautions including but not limited to vaginal bleeding, contractions, leaking of fluid and fetal movement were reviewed in detail with the patient. Please refer to After Visit Summary for other counseling recommendations.   Return in about 2 weeks (around 03/23/2018) for rob.  Rod Can, CNM 03/09/2018 8:28 AM

## 2018-03-23 ENCOUNTER — Ambulatory Visit (INDEPENDENT_AMBULATORY_CARE_PROVIDER_SITE_OTHER): Payer: 59 | Admitting: Obstetrics & Gynecology

## 2018-03-23 VITALS — BP 120/80 | Wt 240.0 lb

## 2018-03-23 DIAGNOSIS — O0940 Supervision of pregnancy with grand multiparity, unspecified trimester: Secondary | ICD-10-CM

## 2018-03-23 DIAGNOSIS — O09523 Supervision of elderly multigravida, third trimester: Secondary | ICD-10-CM

## 2018-03-23 DIAGNOSIS — O99013 Anemia complicating pregnancy, third trimester: Secondary | ICD-10-CM

## 2018-03-23 DIAGNOSIS — Z3A31 31 weeks gestation of pregnancy: Secondary | ICD-10-CM

## 2018-03-23 DIAGNOSIS — Z23 Encounter for immunization: Secondary | ICD-10-CM | POA: Diagnosis not present

## 2018-03-23 DIAGNOSIS — O0990 Supervision of high risk pregnancy, unspecified, unspecified trimester: Secondary | ICD-10-CM

## 2018-03-23 DIAGNOSIS — O99019 Anemia complicating pregnancy, unspecified trimester: Secondary | ICD-10-CM

## 2018-03-23 LAB — POCT URINALYSIS DIPSTICK OB
Glucose, UA: NEGATIVE
PROTEIN: NEGATIVE

## 2018-03-23 NOTE — Patient Instructions (Signed)
Third Trimester of Pregnancy The third trimester is from week 28 through week 40 (months 7 through 9). The third trimester is a time when the unborn baby (fetus) is growing rapidly. At the end of the ninth month, the fetus is about 20 inches in length and weighs 6-10 pounds. Body changes during your third trimester Your body will continue to go through many changes during pregnancy. The changes vary from woman to woman. During the third trimester:  Your weight will continue to increase. You can expect to gain 25-35 pounds (11-16 kg) by the end of the pregnancy.  You may begin to get stretch marks on your hips, abdomen, and breasts.  You may urinate more often because the fetus is moving lower into your pelvis and pressing on your bladder.  You may develop or continue to have heartburn. This is caused by increased hormones that slow down muscles in the digestive tract.  You may develop or continue to have constipation because increased hormones slow digestion and cause the muscles that push waste through your intestines to relax.  You may develop hemorrhoids. These are swollen veins (varicose veins) in the rectum that can itch or be painful.  You may develop swollen, bulging veins (varicose veins) in your legs.  You may have increased body aches in the pelvis, back, or thighs. This is due to weight gain and increased hormones that are relaxing your joints.  You may have changes in your hair. These can include thickening of your hair, rapid growth, and changes in texture. Some women also have hair loss during or after pregnancy, or hair that feels dry or thin. Your hair will most likely return to normal after your baby is born.  Your breasts will continue to grow and they will continue to become tender. A yellow fluid (colostrum) may leak from your breasts. This is the first milk you are producing for your baby.  Your belly button may stick out.  You may notice more swelling in your hands,  face, or ankles.  You may have increased tingling or numbness in your hands, arms, and legs. The skin on your belly may also feel numb.  You may feel short of breath because of your expanding uterus.  You may have more problems sleeping. This can be caused by the size of your belly, increased need to urinate, and an increase in your body's metabolism.  You may notice the fetus "dropping," or moving lower in your abdomen (lightening).  You may have increased vaginal discharge.  You may notice your joints feel loose and you may have pain around your pelvic bone.  What to expect at prenatal visits You will have prenatal exams every 2 weeks until week 36. Then you will have weekly prenatal exams. During a routine prenatal visit:  You will be weighed to make sure you and the baby are growing normally.  Your blood pressure will be taken.  Your abdomen will be measured to track your baby's growth.  The fetal heartbeat will be listened to.  Any test results from the previous visit will be discussed.  You may have a cervical check near your due date to see if your cervix has softened or thinned (effaced).  You will be tested for Group B streptococcus. This happens between 35 and 37 weeks.  Your health care provider may ask you:  What your birth plan is.  How you are feeling.  If you are feeling the baby move.  If you have had   any abnormal symptoms, such as leaking fluid, bleeding, severe headaches, or abdominal cramping.  If you are using any tobacco products, including cigarettes, chewing tobacco, and electronic cigarettes.  If you have any questions.  Other tests or screenings that may be performed during your third trimester include:  Blood tests that check for low iron levels (anemia).  Fetal testing to check the health, activity level, and growth of the fetus. Testing is done if you have certain medical conditions or if there are problems during the  pregnancy.  Nonstress test (NST). This test checks the health of your baby to make sure there are no signs of problems, such as the baby not getting enough oxygen. During this test, a belt is placed around your belly. The baby is made to move, and its heart rate is monitored during movement.  What is false labor? False labor is a condition in which you feel small, irregular tightenings of the muscles in the womb (contractions) that usually go away with rest, changing position, or drinking water. These are called Braxton Hicks contractions. Contractions may last for hours, days, or even weeks before true labor sets in. If contractions come at regular intervals, become more frequent, increase in intensity, or become painful, you should see your health care provider. What are the signs of labor?  Abdominal cramps.  Regular contractions that start at 10 minutes apart and become stronger and more frequent with time.  Contractions that start on the top of the uterus and spread down to the lower abdomen and back.  Increased pelvic pressure and dull back pain.  A watery or bloody mucus discharge that comes from the vagina.  Leaking of amniotic fluid. This is also known as your "water breaking." It could be a slow trickle or a gush. Let your health care provider know if it has a color or strange odor. If you have any of these signs, call your health care provider right away, even if it is before your due date. Follow these instructions at home: Medicines  Follow your health care provider's instructions regarding medicine use. Specific medicines may be either safe or unsafe to take during pregnancy.  Take a prenatal vitamin that contains at least 600 micrograms (mcg) of folic acid.  If you develop constipation, try taking a stool softener if your health care provider approves. Eating and drinking  Eat a balanced diet that includes fresh fruits and vegetables, whole grains, good sources of protein  such as meat, eggs, or tofu, and low-fat dairy. Your health care provider will help you determine the amount of weight gain that is right for you.  Avoid raw meat and uncooked cheese. These carry germs that can cause birth defects in the baby.  If you have low calcium intake from food, talk to your health care provider about whether you should take a daily calcium supplement.  Eat four or five small meals rather than three large meals a day.  Limit foods that are high in fat and processed sugars, such as fried and sweet foods.  To prevent constipation: ? Drink enough fluid to keep your urine clear or pale yellow. ? Eat foods that are high in fiber, such as fresh fruits and vegetables, whole grains, and beans. Activity  Exercise only as directed by your health care provider. Most women can continue their usual exercise routine during pregnancy. Try to exercise for 30 minutes at least 5 days a week. Stop exercising if you experience uterine contractions.  Avoid heavy   lifting.  Do not exercise in extreme heat or humidity, or at high altitudes.  Wear low-heel, comfortable shoes.  Practice good posture.  You may continue to have sex unless your health care provider tells you otherwise. Relieving pain and discomfort  Take frequent breaks and rest with your legs elevated if you have leg cramps or low back pain.  Take warm sitz baths to soothe any pain or discomfort caused by hemorrhoids. Use hemorrhoid cream if your health care provider approves.  Wear a good support bra to prevent discomfort from breast tenderness.  If you develop varicose veins: ? Wear support pantyhose or compression stockings as told by your healthcare provider. ? Elevate your feet for 15 minutes, 3-4 times a day. Prenatal care  Write down your questions. Take them to your prenatal visits.  Keep all your prenatal visits as told by your health care provider. This is important. Safety  Wear your seat belt at  all times when driving.  Make a list of emergency phone numbers, including numbers for family, friends, the hospital, and police and fire departments. General instructions  Avoid cat litter boxes and soil used by cats. These carry germs that can cause birth defects in the baby. If you have a cat, ask someone to clean the litter box for you.  Do not travel far distances unless it is absolutely necessary and only with the approval of your health care provider.  Do not use hot tubs, steam rooms, or saunas.  Do not drink alcohol.  Do not use any products that contain nicotine or tobacco, such as cigarettes and e-cigarettes. If you need help quitting, ask your health care provider.  Do not use any medicinal herbs or unprescribed drugs. These chemicals affect the formation and growth of the baby.  Do not douche or use tampons or scented sanitary pads.  Do not cross your legs for long periods of time.  To prepare for the arrival of your baby: ? Take prenatal classes to understand, practice, and ask questions about labor and delivery. ? Make a trial run to the hospital. ? Visit the hospital and tour the maternity area. ? Arrange for maternity or paternity leave through employers. ? Arrange for family and friends to take care of pets while you are in the hospital. ? Purchase a rear-facing car seat and make sure you know how to install it in your car. ? Pack your hospital bag. ? Prepare the baby's nursery. Make sure to remove all pillows and stuffed animals from the baby's crib to prevent suffocation.  Visit your dentist if you have not gone during your pregnancy. Use a soft toothbrush to brush your teeth and be gentle when you floss. Contact a health care provider if:  You are unsure if you are in labor or if your water has broken.  You become dizzy.  You have mild pelvic cramps, pelvic pressure, or nagging pain in your abdominal area.  You have lower back pain.  You have persistent  nausea, vomiting, or diarrhea.  You have an unusual or bad smelling vaginal discharge.  You have pain when you urinate. Get help right away if:  Your water breaks before 37 weeks.  You have regular contractions less than 5 minutes apart before 37 weeks.  You have a fever.  You are leaking fluid from your vagina.  You have spotting or bleeding from your vagina.  You have severe abdominal pain or cramping.  You have rapid weight loss or weight gain.    You have shortness of breath with chest pain.  You notice sudden or extreme swelling of your face, hands, ankles, feet, or legs.  Your baby makes fewer than 10 movements in 2 hours.  You have severe headaches that do not go away when you take medicine.  You have vision changes. Summary  The third trimester is from week 28 through week 40, months 7 through 9. The third trimester is a time when the unborn baby (fetus) is growing rapidly.  During the third trimester, your discomfort may increase as you and your baby continue to gain weight. You may have abdominal, leg, and back pain, sleeping problems, and an increased need to urinate.  During the third trimester your breasts will keep growing and they will continue to become tender. A yellow fluid (colostrum) may leak from your breasts. This is the first milk you are producing for your baby.  False labor is a condition in which you feel small, irregular tightenings of the muscles in the womb (contractions) that eventually go away. These are called Braxton Hicks contractions. Contractions may last for hours, days, or even weeks before true labor sets in.  Signs of labor can include: abdominal cramps; regular contractions that start at 10 minutes apart and become stronger and more frequent with time; watery or bloody mucus discharge that comes from the vagina; increased pelvic pressure and dull back pain; and leaking of amniotic fluid. This information is not intended to replace advice  given to you by your health care provider. Make sure you discuss any questions you have with your health care provider. Document Released: 06/23/2001 Document Revised: 12/05/2015 Document Reviewed: 08/30/2012 Elsevier Interactive Patient Education  2017 [employer]  

## 2018-03-23 NOTE — Progress Notes (Signed)
  Subjective  Fetal Movement? yes Contractions? no Leaking Fluid? no Vaginal Bleeding? no Constipation and hemorrhoids Objective  BP 120/80   Wt 240 lb (108.9 kg)   LMP 08/18/2017   BMI 37.59 kg/m  General: NAD Pumonary: no increased work of breathing Abdomen: gravid, non-tender Extremities: no edema Psychiatric: mood appropriate, affect full  Assessment  37 y.o. [id number] at [redacted]w[redacted]d by  05/25/2018, by Last Menstrual Period presenting for routine prenatal visit  Plan   Problem List Items Addressed This Visit      Other   Supervision of high risk pregnancy, antepartum   Advanced maternal age in multigravida    Other Visit Diagnoses    Need for Tdap vaccination    -  Primary   [redacted] weeks gestation of pregnancy       Relevant Orders   POC Urinalysis Dipstick OB (Completed)   Anemia affecting sixth pregnancy       Relevant Orders   Hemoglobin    Fe for anemia, Ferralet 90 samples given Recheck Hgb today Plans VBAC (prior success) and PP BTL TDaP today  Barnett Applebaum, MD, [facility], [facility] 03/23/2018  9:22 AM

## 2018-03-24 LAB — HEMOGLOBIN: Hemoglobin: 9.9 g/dL — ABNORMAL LOW (ref 11.1–15.9)

## 2018-03-24 NOTE — Progress Notes (Signed)
Let her know iron still low and needs to take the iron I gave her daily

## 2018-03-28 NOTE — Progress Notes (Signed)
Pt aware.

## 2018-04-06 ENCOUNTER — Ambulatory Visit (INDEPENDENT_AMBULATORY_CARE_PROVIDER_SITE_OTHER): Payer: 59 | Admitting: Obstetrics and Gynecology

## 2018-04-06 ENCOUNTER — Encounter: Payer: Self-pay | Admitting: Obstetrics and Gynecology

## 2018-04-06 VITALS — BP 118/74 | Wt 242.0 lb

## 2018-04-06 DIAGNOSIS — R12 Heartburn: Secondary | ICD-10-CM

## 2018-04-06 DIAGNOSIS — Z3A33 33 weeks gestation of pregnancy: Secondary | ICD-10-CM

## 2018-04-06 DIAGNOSIS — O0990 Supervision of high risk pregnancy, unspecified, unspecified trimester: Secondary | ICD-10-CM

## 2018-04-06 DIAGNOSIS — O10913 Unspecified pre-existing hypertension complicating pregnancy, third trimester: Secondary | ICD-10-CM

## 2018-04-06 DIAGNOSIS — O26892 Other specified pregnancy related conditions, second trimester: Secondary | ICD-10-CM

## 2018-04-06 DIAGNOSIS — O34219 Maternal care for unspecified type scar from previous cesarean delivery: Secondary | ICD-10-CM

## 2018-04-06 DIAGNOSIS — O09523 Supervision of elderly multigravida, third trimester: Secondary | ICD-10-CM

## 2018-04-06 DIAGNOSIS — O99013 Anemia complicating pregnancy, third trimester: Secondary | ICD-10-CM

## 2018-04-06 DIAGNOSIS — Z98891 History of uterine scar from previous surgery: Secondary | ICD-10-CM

## 2018-04-06 NOTE — Progress Notes (Signed)
Routine Prenatal Care Visit  Subjective  Danielle Romero is a 37 y.o. [phone] at [redacted]w[redacted]d being seen today for ongoing prenatal care.  She is currently monitored for the following issues for this high-risk pregnancy and has Breast mass, left; Abnormal mammogram of left breast; Supervision of high risk pregnancy, antepartum; History of cesarean delivery; Advanced maternal age in multigravida; 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; Heartburn during pregnancy in second trimester; and Anemia of pregnancy on their problem list.  ----------------------------------------------------------------------------------- Patient reports no complaints.   Contractions: Irritability. Vag. Bleeding: None.  Movement: Present. Denies leaking of fluid.  The patient has anemia and has tried iron sulfate and has tried prescription oral iron therapy.  All forms of iron therapy cause her extreme constipation and no longer wants to take any oral iron therapy.  ----------------------------------------------------------------------------------- The following portions of the patient's history were reviewed and updated as appropriate: allergies, current medications, past family history, past medical history, past social history, past surgical history and problem list. Problem list updated.   Objective  Blood pressure 118/74, weight 242 lb (109.8 kg), last menstrual period 08/18/2017. Pregravid weight 235 lb (106.6 kg) Total Weight Gain 7 lb (3.175 kg) Urinalysis: Urine Protein    Urine Glucose    Fetal Status: Fetal Heart Rate (bpm): 145 Fundal Height: 34 cm Movement: Present     General:  Alert, oriented and cooperative. Patient is in no acute distress.  Skin: Skin is warm and dry. No rash noted.   Cardiovascular: Normal heart rate noted  Respiratory: Normal respiratory effort, no problems with respiration noted  Abdomen: Soft, gravid, appropriate for gestational age. Pain/Pressure: Present     Pelvic:   Cervical exam deferred        Extremities: Normal range of motion.  Edema: None  Mental Status: Normal mood and affect. Normal behavior. Normal judgment and thought content.   Assessment   37 y.o. [id number] at [redacted]w[redacted]d by  05/25/2018, by Last Menstrual Period presenting for routine prenatal visit  Plan   pregnancy Problems (from 09/29/17 to present)    Problem Noted Resolved   Anemia of pregnancy 04/07/2018 by Will Bonnet, MD No   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 12/21/2017 by Homero Fellers, MD No   Overview Addendum 01/26/2018  9:21 AM by Rexene Agent, CNM    Labetalol 200mg  BID Daily 81 mg aspirin  [x ] Aspirin 81 mg daily after 12 weeks; discontinue after 36 weeks [x ] baseline labs with CBC, CMP, urine protein/creatinine ratio [ ]  ultrasound for growth at 28, 32, 36 weeks [ ]  Baseline EKG   Current antihypertensives:  Labetalol   Baseline and surveillance labs (pulled in from [facility], refresh links as needed)  Lab Results  Component Value Date   PLT 270 10/18/2017   CREATININE 0.50 10/18/2017   AST 17 10/18/2017   ALT 15 10/18/2017    Antenatal Testing CHTN - O10.919  Group I  BP < 140/90, no preeclampsia, AGA,  nml AFV, +/- meds    Group II BP > 140/90, on meds, no preeclampsia, AGA, nml AFV  20-28-34-38  20-24-28-32-35-38  32//2 x wk  28//BPP wkly then 32//2 x wk  40 no meds; 39 meds  PRN or 37  Pre-eclampsia  GHTN - O13.9/Preeclampsia without severe features  - O14.00   Preeclampsia with severe features - O14.10  Q 3-4wks  Q 2 wks  28//BPP wkly then 32//2 x wk  Inpatient  36  PRN or 34  History of cesarean delivery 10/06/2017 by Will Bonnet, MD No   Advanced maternal age in multigravida 10/06/2017 by Will Bonnet, MD No   Supervision of high risk pregnancy, antepartum 09/29/2017 by Rod Can, CNM No   Overview Addendum 02/23/2018 10:55 AM by Dalia Heading, [place] Prenatal Labs   Dating  LMP=6week [place] Blood type: A/Positive/-- (03/27 1115)   Genetic Screen NIPS: collected 11/03/17 Antibody:Negative (03/27 1115)  Anatomic [place] Complete 01/26/2018 Rubella: 4.84 (03/27 1115) Varicella: Immune  GTT Early: 109               Third trimester:  RPR: Non Reactive (03/27 1115)   Rhogam  Not needed HBsAg: Negative (03/27 1115)   TDaP vaccine   Flu Shot: declined HIV: Non Reactive (03/27 1115)   Baby Food         Breast                       GBS:   Contraception  Pap:NIL 12/01/2017  CBB     CS/VBAC With G4/VBAC G5   Support Person Michael             Preterm labor symptoms and general obstetric precautions including but not limited to vaginal bleeding, contractions, leaking of fluid and fetal movement were reviewed in detail with the patient. Please refer to After Visit Summary for other counseling recommendations.   - referral to hematology for consideration of iron infusion.  Return in about 1 week (around 04/13/2018) for U/S for growth with ROB/NST.  Prentice Docker, MD, [facility], [facility] 04/06/2018 10:04 AM

## 2018-04-07 ENCOUNTER — Encounter: Payer: Self-pay | Admitting: Obstetrics and Gynecology

## 2018-04-07 DIAGNOSIS — O99019 Anemia complicating pregnancy, unspecified trimester: Secondary | ICD-10-CM | POA: Insufficient documentation

## 2018-04-12 ENCOUNTER — Ambulatory Visit (INDEPENDENT_AMBULATORY_CARE_PROVIDER_SITE_OTHER): Payer: 59 | Admitting: Advanced Practice Midwife

## 2018-04-12 ENCOUNTER — Ambulatory Visit (INDEPENDENT_AMBULATORY_CARE_PROVIDER_SITE_OTHER): Payer: 59

## 2018-04-12 ENCOUNTER — Encounter: Payer: Self-pay | Admitting: Advanced Practice Midwife

## 2018-04-12 VITALS — BP 138/78 | Wt 238.0 lb

## 2018-04-12 DIAGNOSIS — O09523 Supervision of elderly multigravida, third trimester: Secondary | ICD-10-CM

## 2018-04-12 DIAGNOSIS — Z3A33 33 weeks gestation of pregnancy: Secondary | ICD-10-CM

## 2018-04-12 DIAGNOSIS — O34219 Maternal care for unspecified type scar from previous cesarean delivery: Secondary | ICD-10-CM | POA: Diagnosis not present

## 2018-04-12 DIAGNOSIS — O0990 Supervision of high risk pregnancy, unspecified, unspecified trimester: Secondary | ICD-10-CM

## 2018-04-12 DIAGNOSIS — O10913 Unspecified pre-existing hypertension complicating pregnancy, third trimester: Secondary | ICD-10-CM | POA: Diagnosis not present

## 2018-04-12 LAB — POCT URINALYSIS DIPSTICK OB
GLUCOSE, UA: NEGATIVE
PROTEIN: NEGATIVE

## 2018-04-12 LAB — FETAL NONSTRESS TEST

## 2018-04-12 NOTE — Progress Notes (Signed)
Routine Prenatal Care Visit  Subjective  Danielle Romero is a 37 y.o. [phone] at [redacted]w[redacted]d being seen today for ongoing prenatal care.  She is currently monitored for the following issues for this high-risk pregnancy and has Breast mass, left; Abnormal mammogram of left breast; Supervision of high risk pregnancy, antepartum; History of cesarean delivery; Advanced maternal age in multigravida; 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; Heartburn during pregnancy in second trimester; and Anemia of pregnancy on their problem list.  ----------------------------------------------------------------------------------- Patient reports occasional contractions.  She stopped taking labetalol during second trimester per Dr Kenton Kingfisher recommendation. She has heme consult in 1 week to discuss Fe infusion. Contractions: Irritability. Vag. Bleeding: None.  Movement: Present. Denies leaking of fluid.  ----------------------------------------------------------------------------------- The following portions of the patient's history were reviewed and updated as appropriate: allergies, current medications, past family history, past medical history, past social history, past surgical history and problem list. Problem list updated.   Objective  Blood pressure 138/78, weight 238 lb (108 kg), last menstrual period 08/18/2017. Pregravid weight 235 lb (106.6 kg) Total Weight Gain 3 lb (1.361 kg) Urinalysis: Urine Protein Negative  Urine Glucose Negative  Fetal Status: Fetal Heart Rate (bpm): 150 Fundal Height: 36 cm Movement: Present  Presentation: Vertex  Non-stress test today: 20 minute tracing is reactive with baseline of 150 bpm, moderate variability, positive accelerations, negative decelerations Growth scan today: 49.5%, 5 pounds 3 ounces, AFI: 11.1 cm  General:  Alert, oriented and cooperative. Patient is in no acute distress.  Skin: Skin is warm and dry. No rash noted.   Cardiovascular: Normal heart rate  noted  Respiratory: Normal respiratory effort, no problems with respiration noted  Abdomen: Soft, gravid, appropriate for gestational age. Pain/Pressure: Absent     Pelvic:  Cervical exam deferred        Extremities: Normal range of motion.  Edema: None  Mental Status: Normal mood and affect. Normal behavior. Normal judgment and thought content.   Assessment   36 y.o. [id number] at [redacted]w[redacted]d by  05/25/2018, by Last Menstrual Period presenting for routine prenatal visit  Plan   pregnancy Problems (from 09/29/17 to present)    Problem Noted Resolved   Anemia of pregnancy 04/07/2018 by Will Bonnet, MD No   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 12/21/2017 by Homero Fellers, MD No   Overview Addendum 01/26/2018  9:21 AM by Rexene Agent, CNM    Labetalol 200mg  BID Daily 81 mg aspirin  [x ] Aspirin 81 mg daily after 12 weeks; discontinue after 36 weeks [x ] baseline labs with CBC, CMP, urine protein/creatinine ratio [ ]  ultrasound for growth at 28, 32, 36 weeks [ ]  Baseline EKG   Current antihypertensives:  none  Baseline and surveillance labs (pulled in from [facility], refresh links as needed)  Lab Results  Component Value Date   PLT 270 10/18/2017   CREATININE 0.50 10/18/2017   AST 17 10/18/2017   ALT 15 10/18/2017    Antenatal Testing CHTN - O10.919  Group I  BP < 140/90, no preeclampsia, AGA,  nml AFV, +/- meds    Group II BP > 140/90, on meds, no preeclampsia, AGA, nml AFV  20-28-34-38  20-24-28-32-35-38  32//2 x wk  28//BPP wkly then 32//2 x wk  40 no meds; 39 meds  PRN or 37  Pre-eclampsia  GHTN - O13.9/Preeclampsia without severe features  - O14.00   Preeclampsia with severe features - O14.10  Q 3-4wks  Q 2 wks  28//BPP wkly then 32//2  x wk  Inpatient  36  PRN or 34        History of cesarean delivery 10/06/2017 by Will Bonnet, MD No   Advanced maternal age in multigravida 10/06/2017 by Will Bonnet, MD No   Supervision  of high risk pregnancy, antepartum 09/29/2017 by Rod Can, CNM No   Overview Addendum 02/23/2018 10:55 AM by Dalia Heading, [place] Prenatal Labs  Dating  LMP=6week [place] Blood type: A/Positive/-- (03/27 1115)   Genetic Screen NIPS: collected 11/03/17 Antibody:Negative (03/27 1115)  Anatomic [place] Complete 01/26/2018 Rubella: 4.84 (03/27 1115) Varicella: Immune  GTT Early: 109               Third trimester:  RPR: Non Reactive (03/27 1115)   Rhogam  Not needed HBsAg: Negative (03/27 1115)   TDaP vaccine   Flu Shot: declined HIV: Non Reactive (03/27 1115)   Baby Food         Breast                       GBS:   Contraception  Pap:NIL 12/01/2017  CBB     CS/VBAC With G4/VBAC G5   Support Person Michael               Preterm labor symptoms and general obstetric precautions including but not limited to vaginal bleeding, contractions, leaking of fluid and fetal movement were reviewed in detail with the patient.   Return in about 2 weeks (around 04/26/2018) for nst/rob.  Rod Can, CNM 04/12/2018 9:21 AM

## 2018-04-12 NOTE — Progress Notes (Signed)
ROB Growth scan today/NST Declined Flu vaccine

## 2018-04-15 ENCOUNTER — Encounter: Payer: 59 | Admitting: Oncology

## 2018-04-19 ENCOUNTER — Other Ambulatory Visit: Payer: Self-pay

## 2018-04-19 ENCOUNTER — Inpatient Hospital Stay: Payer: 59

## 2018-04-19 ENCOUNTER — Inpatient Hospital Stay: Payer: 59 | Attending: Oncology | Admitting: Oncology

## 2018-04-19 ENCOUNTER — Encounter: Payer: Self-pay | Admitting: Oncology

## 2018-04-19 VITALS — BP 134/71 | HR 93 | Temp 98.1°F | Wt 240.0 lb

## 2018-04-19 DIAGNOSIS — O99013 Anemia complicating pregnancy, third trimester: Secondary | ICD-10-CM

## 2018-04-19 DIAGNOSIS — D509 Iron deficiency anemia, unspecified: Secondary | ICD-10-CM | POA: Insufficient documentation

## 2018-04-19 DIAGNOSIS — D508 Other iron deficiency anemias: Secondary | ICD-10-CM

## 2018-04-19 LAB — CBC WITH DIFFERENTIAL/PLATELET
ABS IMMATURE GRANULOCYTES: 0.13 10*3/uL — AB (ref 0.00–0.07)
BASOS PCT: 0 %
Basophils Absolute: 0 10*3/uL (ref 0.0–0.1)
EOS ABS: 0.1 10*3/uL (ref 0.0–0.5)
Eosinophils Relative: 1 %
HEMATOCRIT: 30.9 % — AB (ref 36.0–46.0)
HEMOGLOBIN: 10 g/dL — AB (ref 12.0–15.0)
IMMATURE GRANULOCYTES: 1 %
Lymphocytes Relative: 15 %
Lymphs Abs: 1.6 10*3/uL (ref 0.7–4.0)
MCH: 24.3 pg — ABNORMAL LOW (ref 26.0–34.0)
MCHC: 32.4 g/dL (ref 30.0–36.0)
MCV: 75.2 fL — AB (ref 80.0–100.0)
MONO ABS: 0.8 10*3/uL (ref 0.1–1.0)
Monocytes Relative: 8 %
Neutro Abs: 7.9 10*3/uL — ABNORMAL HIGH (ref 1.7–7.7)
Neutrophils Relative %: 75 %
Platelets: 198 10*3/uL (ref 150–400)
RBC: 4.11 MIL/uL (ref 3.87–5.11)
RDW: 15.9 % — AB (ref 11.5–15.5)
WBC: 10.5 10*3/uL (ref 4.0–10.5)
nRBC: 0 % (ref 0.0–0.2)

## 2018-04-19 LAB — COMPREHENSIVE METABOLIC PANEL
ALT: 9 U/L (ref 0–44)
AST: 13 U/L — AB (ref 15–41)
Albumin: 3 g/dL — ABNORMAL LOW (ref 3.5–5.0)
Alkaline Phosphatase: 176 U/L — ABNORMAL HIGH (ref 38–126)
Anion gap: 7 (ref 5–15)
BILIRUBIN TOTAL: 0.3 mg/dL (ref 0.3–1.2)
BUN: 7 mg/dL (ref 6–20)
CO2: 21 mmol/L — ABNORMAL LOW (ref 22–32)
CREATININE: 0.59 mg/dL (ref 0.44–1.00)
Calcium: 9.2 mg/dL (ref 8.9–10.3)
Chloride: 110 mmol/L (ref 98–111)
GFR calc Af Amer: >60 mL/min (ref >60)
Glucose, Bld: 89 mg/dL (ref 70–99)
POTASSIUM: 3.6 mmol/L (ref 3.5–5.1)
Sodium: 138 mmol/L (ref 135–145)
Total Protein: 6.6 g/dL (ref 6.5–8.1)

## 2018-04-19 LAB — IRON AND TIBC
Iron: 52 ug/dL (ref 28–170)
SATURATION RATIOS: 11 % (ref 10.4–31.8)
TIBC: 484 ug/dL — ABNORMAL HIGH (ref 250–450)
UIBC: 432 ug/dL

## 2018-04-19 LAB — PATHOLOGIST SMEAR REVIEW

## 2018-04-19 LAB — FERRITIN: Ferritin: 16 ng/mL (ref 11–307)

## 2018-04-19 NOTE — Progress Notes (Signed)
Patient here today as a new patient with anemia  

## 2018-04-19 NOTE — Progress Notes (Signed)
Hematology/Oncology Consult note [facility] [phone] [phone]   Patient Care Team: [facility], [facility] as PCP - General (General Practice)  REFERRING PROVIDER: Obgyn Dr.Jackson CHIEF COMPLAINTS/REASON FOR VISIT:  Evaluation of anemia  HISTORY OF PRESENTING ILLNESS:  Danielle Romero is a  37 y.o.  female with PMH listed below who was referred to me for evaluation of anemia Reviewed patient's recent labs that was done at ob's office. Labs revealed anemia with hemoglobin of 9.9 Reviewed patient's previous labs ordered , her hemoglobin was 9.7 in August 2019.  Patient reports that she has been started on oral iron tablets however she could not tolerate due to severe constipation. She is currently pregnant with her fifth child, in her third trimester.  Reports that she had a history of anemia during her previous pregnancy as well.  Usually hemoglobin is not this low-end usually recovers after she delivers the baby. No aggravating or improving factors.  Associated signs and symptoms: Patient reports fatigue.  Denies SOB with exertion.  Denies weight loss, easy bruising, hematochezia, hemoptysis, hematuria. Context: History of GI bleeding: Denies               History of Chronic kidney disease denies               History of autoimmune disease denies               History of hemolytic anemia.  Denies               Last colonoscopy: Denies             Denies any alcohol use.    Review of Systems  Constitutional: Positive for malaise/fatigue. Negative for chills and fever.  HENT: Negative for nosebleeds and sore throat.   Eyes: Negative for double vision, photophobia and redness.  Respiratory: Negative for cough, shortness of breath and wheezing.   Cardiovascular: Negative for chest pain, palpitations and orthopnea.  Gastrointestinal: Negative for abdominal pain, blood in stool, nausea and vomiting.  Genitourinary:  Negative for dysuria.  Musculoskeletal: Negative for back pain, myalgias and neck pain.  Skin: Negative for itching and rash.  Neurological: Negative for dizziness, tingling and tremors.  Endo/Heme/Allergies: Negative for environmental allergies. Does not bruise/bleed easily.  Psychiatric/Behavioral: Negative for depression.    MEDICAL HISTORY:  Past Medical History:  Diagnosis Date  . Breast mass   . Bronchitis   . GERD (gastroesophageal reflux disease)    NO MEDS  . Obesity (BMI 30-39.9)     SURGICAL HISTORY: Past Surgical History:  Procedure Laterality Date  . BREAST BIOPSY Left 08/13/2016   Procedure: left BREAST BIOPSY WITH NEEDLE LOCALIZATION left Moeller's gland excision;  Surgeon: Florene Glen, MD;  Location: [facility] ORS;  Service: General;  Laterality: Left;  . CESAREAN SECTION  2007    SOCIAL HISTORY: Social History   Socioeconomic History  . Marital status: Married    Spouse name: Not on file  . Number of children: 4  . Years of education: Not on file  . Highest education level: Not on file  Occupational History  . Not on file  Social Needs  . Financial resource strain: Not on file  . Food insecurity:    Worry: Not on file    Inability: Not on file  . Transportation needs:    Medical: Not on file    Non-medical: Not on file  Tobacco Use  . Smoking status: Current Every Day  Smoker    Packs/day: 0.25    Years: 16.00    Pack years: 4.00    Types: Cigarettes  . Smokeless tobacco: Never Used  . Tobacco comment: 2 CIG PER DAY/trying to quit  Substance and Sexual Activity  . Alcohol use: Not Currently    Comment: Occasional  . Drug use: No  . Sexual activity: Yes    Birth control/protection: None  Lifestyle  . Physical activity:    Days per week: Not on file    Minutes per session: Not on file  . Stress: Not on file  Relationships  . Social connections:    Talks on phone: Not on file    Gets together: Not on file    Attends religious service: Not  on file    Active member of club or organization: Not on file    Attends meetings of clubs or organizations: Not on file    Relationship status: Not on file  . Intimate partner violence:    Fear of current or ex partner: Not on file    Emotionally abused: Not on file    Physically abused: Not on file    Forced sexual activity: Not on file  Other Topics Concern  . Not on file  Social History Narrative  . Not on file    FAMILY HISTORY: Family History  Problem Relation Age of Onset  . Hypertension Mother   . Breast cancer Mother 68  . Kidney disease Mother   . Cirrhosis Father   . Alcohol abuse Father     ALLERGIES:  has No Known Allergies.  MEDICATIONS:  Current Outpatient Medications  Medication Sig Dispense Refill  . aspirin EC 81 MG tablet Take 1 tablet (81 mg total) by mouth daily. Take after 12 weeks for prevention of preeclampssia later in pregnancy 300 tablet 2  . Prenat-FeFmCb-DSS-FA-DHA w/o A (CITRANATAL HARMONY) 27-1-260 MG CAPS Take 1 capsule by mouth daily. 30 capsule 7   No current facility-administered medications for this visit.      PHYSICAL EXAMINATION: ECOG PERFORMANCE STATUS: 1 - Symptomatic but completely ambulatory Vitals:   04/19/18 0943  BP: 134/71  Pulse: 93  Temp: 98.1 F (36.7 C)   Filed Weights   04/19/18 0943  Weight: 240 lb (108.9 kg)    Physical Exam  Constitutional: She is oriented to person, place, and time. No distress.  HENT:  Head: Normocephalic and atraumatic.  Mouth/Throat: Oropharynx is clear and moist.  Eyes: Pupils are equal, round, and reactive to light. EOM are normal. No scleral icterus.  Neck: Normal range of motion. Neck supple.  Cardiovascular: Normal rate, regular rhythm and normal heart sounds.  Pulmonary/Chest: Effort normal. No respiratory distress. She has no wheezes.  Abdominal: Soft. Bowel sounds are normal. She exhibits no mass. There is no tenderness.  Gravidas uterus.  Musculoskeletal: Normal range of  motion. She exhibits no edema or deformity.  Neurological: She is alert and oriented to person, place, and time. No cranial nerve deficit. Coordination normal.  Skin: Skin is warm and dry. No rash noted. No erythema.  Psychiatric: She has a normal mood and affect. Her behavior is normal. Thought content normal.     LABORATORY DATA:  I have reviewed the data as listed Lab Results  Component Value Date   WBC 10.5 04/19/2018   HGB 10.0 (L) 04/19/2018   HCT 30.9 (L) 04/19/2018   MCV 75.2 (L) 04/19/2018   PLT 198 04/19/2018   Recent Labs    10/18/17  2423 04/19/18 1041  NA 136 138  K 3.8 3.6  CL 108 110  CO2 21* 21*  GLUCOSE 108* 89  BUN 10 7  CREATININE 0.50 0.59  CALCIUM 9.0 9.2  GFRNONAA >60 >60  GFRAA >60 >60  PROT 7.5 6.6  ALBUMIN 3.6 3.0*  AST 17 13*  ALT 15 9  ALKPHOS 103 176*  BILITOT 0.4 0.3   Iron/TIBC/Ferritin/ %Sat    Component Value Date/Time   IRON 52 04/19/2018 1041   TIBC 484 (H) 04/19/2018 1041   FERRITIN 16 04/19/2018 1041   IRONPCTSAT 11 04/19/2018 1041        ASSESSMENT & PLAN:  1. Anemia during pregnancy in third trimester   2. Other iron deficiency anemia    Anemia: microcytic, likely due to iron deficiency. Will check iron/TIBC, ferritin,blood smear,   Labs reviewed. Consistent with iron deficiency anemia. She cannot tolerate oral iron supplements.  Plan IV iron with Venofer 200mg  weekly x 4 doses. Allergy reactions/infusion reaction including anaphylactic reaction discussed with patient. Other side effects include but not limited to high blood pressure, skin rash, weight gain, leg swelling, etc. Patient voices understanding and willing to proceed.  Follow up for reassessment in 8 weeks.   Orders Placed This Encounter  Procedures  . CBC with Differential/Platelet    Standing Status:   Future    Number of Occurrences:   1    Standing Expiration Date:   04/20/2019  . Comprehensive metabolic panel    Standing Status:   Future    Number  of Occurrences:   1    Standing Expiration Date:   04/20/2019  . Iron and TIBC    Standing Status:   Future    Number of Occurrences:   1    Standing Expiration Date:   04/20/2019  . Ferritin    Standing Status:   Future    Number of Occurrences:   1    Standing Expiration Date:   04/20/2019  . Pathologist smear review    Standing Status:   Future    Number of Occurrences:   1    Standing Expiration Date:   04/20/2019  . Iron and TIBC    Standing Status:   Future    Standing Expiration Date:   04/20/2019  . Ferritin    Standing Status:   Future    Standing Expiration Date:   04/20/2019  . CBC with Differential/Platelet    Standing Status:   Future    Standing Expiration Date:   04/20/2019    All questions were answered. The patient knows to call the clinic with any problems questions or concerns.  weeks Return of visit: 2 weeks Thank you for this kind referral and the opportunity to participate in the care of this patient. A copy of today's note is routed to referring provider  Total face to face encounter time for this patient visit was 62min. >50% of the time was  spent in counseling and coordination of care.    Earlie Server, MD, PhD Hematology Oncology [facility] at [facility] Pager- [phone] 04/19/2018

## 2018-04-25 ENCOUNTER — Inpatient Hospital Stay: Payer: 59

## 2018-04-25 VITALS — BP 116/74 | HR 91

## 2018-04-25 DIAGNOSIS — D509 Iron deficiency anemia, unspecified: Secondary | ICD-10-CM | POA: Diagnosis not present

## 2018-04-25 DIAGNOSIS — O99013 Anemia complicating pregnancy, third trimester: Secondary | ICD-10-CM

## 2018-04-25 MED ORDER — IRON SUCROSE 20 MG/ML IV SOLN
200.0000 mg | Freq: Once | INTRAVENOUS | Status: AC
  Administered 2018-04-25: 200 mg via INTRAVENOUS
  Filled 2018-04-25: qty 10

## 2018-04-25 MED ORDER — SODIUM CHLORIDE 0.9 % IV SOLN
Freq: Once | INTRAVENOUS | Status: AC
  Administered 2018-04-25: 14:00:00 via INTRAVENOUS
  Filled 2018-04-25: qty 250

## 2018-04-26 ENCOUNTER — Ambulatory Visit (INDEPENDENT_AMBULATORY_CARE_PROVIDER_SITE_OTHER): Payer: 59 | Admitting: Obstetrics & Gynecology

## 2018-04-26 VITALS — BP 130/82 | Wt 239.0 lb

## 2018-04-26 DIAGNOSIS — Z98891 History of uterine scar from previous surgery: Secondary | ICD-10-CM

## 2018-04-26 DIAGNOSIS — O10913 Unspecified pre-existing hypertension complicating pregnancy, third trimester: Secondary | ICD-10-CM | POA: Diagnosis not present

## 2018-04-26 DIAGNOSIS — O99013 Anemia complicating pregnancy, third trimester: Secondary | ICD-10-CM | POA: Diagnosis not present

## 2018-04-26 DIAGNOSIS — O34219 Maternal care for unspecified type scar from previous cesarean delivery: Secondary | ICD-10-CM

## 2018-04-26 DIAGNOSIS — O09523 Supervision of elderly multigravida, third trimester: Secondary | ICD-10-CM

## 2018-04-26 DIAGNOSIS — O0990 Supervision of high risk pregnancy, unspecified, unspecified trimester: Secondary | ICD-10-CM

## 2018-04-26 DIAGNOSIS — Z3A35 35 weeks gestation of pregnancy: Secondary | ICD-10-CM

## 2018-04-26 LAB — POCT URINALYSIS DIPSTICK OB
GLUCOSE, UA: NEGATIVE
PROTEIN: NEGATIVE

## 2018-04-26 LAB — FETAL NONSTRESS TEST

## 2018-04-26 NOTE — Progress Notes (Signed)
  Subjective  Fetal Movement? yes Contractions? no Leaking Fluid? no Vaginal Bleeding? no  Objective  BP 130/82   Wt 239 lb (108.4 kg)   LMP 08/18/2017   BMI 37.43 kg/m  General: NAD Pumonary: no increased work of breathing Abdomen: gravid, non-tender Extremities: no edema Psychiatric: mood appropriate, affect full  Assessment  37 y.o. [id number] at [redacted]w[redacted]d by  05/25/2018, by Last Menstrual Period presenting for routine prenatal visit  Plan   Problem List Items Addressed This Visit      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    Stable off of meds.    Consider restart Labetalol if increases.    Monitor for s/sx preeclampsia   Supervision of high risk pregnancy, antepartum    AMA risks w delivery d/w pt ANEMIA    Iron infusions weekly thru hematology/cancer center   History of cesarean delivery    Plans VBAC, has been counseled   Advanced maternal age in multigravida   Relevant Orders   US OB Limited next week NST weekly.  AFI next week.   [redacted] weeks gestation of pregnancy    -  Primary   GBS next week    A NST procedure was performed with FHR monitoring and a normal baseline established, appropriate time of 20-40 minutes of evaluation, and accels >2 seen w 15x15 characteristics.  Results show a REACTIVE NST.   Clinic [facility] Prenatal Labs  Dating  LMP=6week [place] Blood type: A/Positive/-- (03/27 1115)   Genetic Screen NIPS: collected 11/03/17 Antibody:Negative (03/27 1115)  Anatomic [place] Complete 01/26/2018 Rubella: 4.84 (03/27 1115) Varicella: Immune  GTT Early: 109               Third trimester:normal  RPR: Non Reactive (03/27 1115)   Rhogam  Not needed HBsAg: Negative (03/27 1115)   TDaP vaccine  03/23/18 Flu Shot: declined HIV: Non Reactive (03/27 1115)   Baby Food      Breast                       GBS:   Contraception     BTL Pap:NIL 12/01/2017  CS/VBAC With G4/VBAC G5   Support Person Mohammed Kindle, MD, [facility], [facility] 04/26/2018  8:35 AM

## 2018-04-26 NOTE — Patient Instructions (Signed)
Braxton Hicks Contractions °Contractions of the uterus can occur throughout pregnancy, but they are not always a sign that you are in labor. You may have practice contractions called Braxton Hicks contractions. These false labor contractions are sometimes confused with true labor. °What are Braxton Hicks contractions? °Braxton Hicks contractions are tightening movements that occur in the muscles of the uterus before labor. Unlike true labor contractions, these contractions do not result in opening (dilation) and thinning of the cervix. Toward the end of pregnancy (32-34 weeks), Braxton Hicks contractions can happen more often and may become stronger. These contractions are sometimes difficult to tell apart from true labor because they can be very uncomfortable. You should not feel embarrassed if you go to the hospital with false labor. °Sometimes, the only way to tell if you are in true labor is for your health care provider to look for changes in the cervix. The health care provider will do a physical exam and may monitor your contractions. If you are not in true labor, the exam should show that your cervix is not dilating and your water has not broken. °If there are other health problems associated with your pregnancy, it is completely safe for you to be sent home with false labor. You may continue to have Braxton Hicks contractions until you go into true labor. °How to tell the difference between true labor and false labor °True labor °· Contractions last 30-70 seconds. °· Contractions become very regular. °· Discomfort is usually felt in the top of the uterus, and it spreads to the lower abdomen and low back. °· Contractions do not go away with walking. °· Contractions usually become more intense and increase in frequency. °· The cervix dilates and gets thinner. °False labor °· Contractions are usually shorter and not as strong as true labor contractions. °· Contractions are usually irregular. °· Contractions  are often felt in the front of the lower abdomen and in the groin. °· Contractions may go away when you walk around or change positions while lying down. °· Contractions get weaker and are shorter-lasting as time goes on. °· The cervix usually does not dilate or become thin. °Follow these instructions at home: °· Take over-the-counter and prescription medicines only as told by your health care provider. °· Keep up with your usual exercises and follow other instructions from your health care provider. °· Eat and drink lightly if you think you are going into labor. °· If Braxton Hicks contractions are making you uncomfortable: °? Change your position from lying down or resting to walking, or change from walking to resting. °? Sit and rest in a tub of warm water. °? Drink enough fluid to keep your urine pale yellow. Dehydration may cause these contractions. °? Do slow and deep breathing several times an hour. °· Keep all follow-up prenatal visits as told by your health care provider. This is important. °Contact a health care provider if: °· You have a fever. °· You have continuous pain in your abdomen. °Get help right away if: °· Your contractions become stronger, more regular, and closer together. °· You have fluid leaking or gushing from your vagina. °· You pass blood-tinged mucus (bloody show). °· You have bleeding from your vagina. °· You have low back pain that you never had before. °· You feel your baby’s head pushing down and causing pelvic pressure. °· Your baby is not moving inside you as much as it used to. °Summary °· Contractions that occur before labor are called Braxton   Hicks contractions, false labor, or practice contractions. °· Braxton Hicks contractions are usually shorter, weaker, farther apart, and less regular than true labor contractions. True labor contractions usually become progressively stronger and regular and they become more frequent. °· Manage discomfort from Braxton Hicks contractions by  changing position, resting in a warm bath, drinking plenty of water, or practicing deep breathing. °This information is not intended to replace advice given to you by your health care provider. Make sure you discuss any questions you have with your health care provider. °Document Released: 11/12/2016 Document Revised: 11/12/2016 Document Reviewed: 11/12/2016 °Elsevier Interactive Patient Education © 2018 [employer] ° °

## 2018-04-27 ENCOUNTER — Telehealth: Payer: Self-pay

## 2018-04-27 NOTE — Telephone Encounter (Signed)
FMLA/DISABILITY form for [employer] filled out, signature obtained and given to [place] for processing.

## 2018-05-02 ENCOUNTER — Inpatient Hospital Stay: Payer: 59

## 2018-05-02 VITALS — BP 123/84 | HR 88 | Temp 97.6°F | Resp 18

## 2018-05-02 DIAGNOSIS — D509 Iron deficiency anemia, unspecified: Secondary | ICD-10-CM | POA: Diagnosis not present

## 2018-05-02 DIAGNOSIS — O99013 Anemia complicating pregnancy, third trimester: Secondary | ICD-10-CM

## 2018-05-02 MED ORDER — IRON SUCROSE 20 MG/ML IV SOLN
200.0000 mg | Freq: Once | INTRAVENOUS | Status: AC
  Administered 2018-05-02: 200 mg via INTRAVENOUS
  Filled 2018-05-02: qty 10

## 2018-05-02 MED ORDER — SODIUM CHLORIDE 0.9 % IV SOLN
Freq: Once | INTRAVENOUS | Status: AC
  Administered 2018-05-02: 14:00:00 via INTRAVENOUS
  Filled 2018-05-02: qty 250

## 2018-05-03 ENCOUNTER — Other Ambulatory Visit: Payer: Self-pay | Admitting: Maternal Newborn

## 2018-05-03 ENCOUNTER — Observation Stay
Admission: EM | Admit: 2018-05-03 | Discharge: 2018-05-03 | Disposition: A | Discharge status: Home / Self Care | Payer: 59 | Attending: Obstetrics and Gynecology | Admitting: Obstetrics and Gynecology

## 2018-05-03 ENCOUNTER — Other Ambulatory Visit: Payer: Self-pay

## 2018-05-03 DIAGNOSIS — O0990 Supervision of high risk pregnancy, unspecified, unspecified trimester: Secondary | ICD-10-CM

## 2018-05-03 DIAGNOSIS — Z3A36 36 weeks gestation of pregnancy: Secondary | ICD-10-CM | POA: Diagnosis not present

## 2018-05-03 DIAGNOSIS — O9989 Other specified diseases and conditions complicating pregnancy, childbirth and the puerperium: Secondary | ICD-10-CM | POA: Diagnosis not present

## 2018-05-03 DIAGNOSIS — O26893 Other specified pregnancy related conditions, third trimester: Secondary | ICD-10-CM | POA: Diagnosis not present

## 2018-05-03 DIAGNOSIS — O09523 Supervision of elderly multigravida, third trimester: Secondary | ICD-10-CM

## 2018-05-03 DIAGNOSIS — O10913 Unspecified pre-existing hypertension complicating pregnancy, third trimester: Secondary | ICD-10-CM

## 2018-05-03 DIAGNOSIS — O99013 Anemia complicating pregnancy, third trimester: Secondary | ICD-10-CM

## 2018-05-03 DIAGNOSIS — M549 Dorsalgia, unspecified: Secondary | ICD-10-CM | POA: Diagnosis not present

## 2018-05-03 DIAGNOSIS — F17210 Nicotine dependence, cigarettes, uncomplicated: Secondary | ICD-10-CM | POA: Diagnosis not present

## 2018-05-03 DIAGNOSIS — M545 Low back pain: Secondary | ICD-10-CM

## 2018-05-03 DIAGNOSIS — O99333 Smoking (tobacco) complicating pregnancy, third trimester: Secondary | ICD-10-CM | POA: Insufficient documentation

## 2018-05-03 DIAGNOSIS — Z98891 History of uterine scar from previous surgery: Secondary | ICD-10-CM

## 2018-05-03 LAB — URINALYSIS, COMPLETE (UACMP) WITH MICROSCOPIC
Bilirubin Urine: NEGATIVE
GLUCOSE, UA: NEGATIVE mg/dL
HGB URINE DIPSTICK: NEGATIVE
Ketones, ur: NEGATIVE mg/dL
Leukocytes, UA: NEGATIVE
Nitrite: NEGATIVE
Protein, ur: NEGATIVE mg/dL
SPECIFIC GRAVITY, URINE: 1.005 (ref 1.005–1.030)
pH: 6 (ref 5.0–8.0)

## 2018-05-03 NOTE — Discharge Summary (Signed)
See Final Progress Note 05/03/2018.

## 2018-05-03 NOTE — OB Triage Note (Signed)
Pt presents c/o shooting pains in her lower back, as well as having to go to the bathroom a lot. Pt states "pain come and goes but is irregular. Some of the pains were bringing me to tears". Patient states, "with this being the my fifth [pregnancy, I would rather be safe than sorry". Denies any bleeding or LOF. Reports positive featl movement. Vitals WNL. Will continue to monitor.

## 2018-05-03 NOTE — Final Progress Note (Signed)
Physician Final Progress Note  Patient ID: Danielle Romero MRN: [id number] DOB/AGE: 03/14/1981 37 y.o.  Admit date: 05/03/2018 Admitting provider: Homero Fellers, MD Discharge date: 05/03/2018   Admission Diagnoses: Active Problems: Indication for care in labor and delivery, antepartum  Discharge Diagnoses:  Back pain in pregnancy   History of Present Illness: The patient is a 37 y.o. female [phone] at [redacted]w[redacted]d who presents for intermittent pains in her lower back, radiating to her abdomen. She rates them as 8/10 when they occur. She has also been urinating frequently. She denies vaginal bleeding and loss of fluid. She endorses good fetal movement.  Review of Systems: Review of systems negative unless otherwise noted in HPI.   Past Medical History:  Diagnosis Date  . Breast mass   . Bronchitis   . GERD (gastroesophageal reflux disease)    NO MEDS  . Obesity (BMI 30-39.9)     Past Surgical History:  Procedure Laterality Date  . BREAST BIOPSY Left 08/13/2016   Procedure: left BREAST BIOPSY WITH NEEDLE LOCALIZATION left Moeller's gland excision;  Surgeon: Florene Glen, MD;  Location: [facility] ORS;  Service: General;  Laterality: Left;  . CESAREAN SECTION  2007    No current facility-administered medications on file prior to encounter.    Current Outpatient Medications on File Prior to Encounter  Medication Sig Dispense Refill  . aspirin EC 81 MG tablet Take 1 tablet (81 mg total) by mouth daily. Take after 12 weeks for prevention of preeclampssia later in pregnancy 300 tablet 2  . Prenat-FeFmCb-DSS-FA-DHA w/o A (CITRANATAL HARMONY) 27-1-260 MG CAPS Take 1 capsule by mouth daily. 30 capsule 7    No Known Allergies  Social History   Socioeconomic History  . Marital status: Married    Spouse name: Not on file  . Number of children: 4  . Years of education: Not on file  . Highest education level: Not on file  Occupational History  . Not on file  Social Needs  .  Financial resource strain: Not on file  . Food insecurity:    Worry: Not on file    Inability: Not on file  . Transportation needs:    Medical: Not on file    Non-medical: Not on file  Tobacco Use  . Smoking status: Current Every Day Smoker    Packs/day: 0.25    Years: 16.00    Pack years: 4.00    Types: Cigarettes  . Smokeless tobacco: Never Used  . Tobacco comment: 2 CIG PER DAY/trying to quit  Substance and Sexual Activity  . Alcohol use: Not Currently    Comment: Occasional  . Drug use: No  . Sexual activity: Yes    Birth control/protection: None  Lifestyle  . Physical activity:    Days per week: Not on file    Minutes per session: Not on file  . Stress: Not on file  Relationships  . Social connections:    Talks on phone: Not on file    Gets together: Not on file    Attends religious service: Not on file    Active member of club or organization: Not on file    Attends meetings of clubs or organizations: Not on file    Relationship status: Not on file  . Intimate partner violence:    Fear of current or ex partner: Not on file    Emotionally abused: Not on file    Physically abused: Not on file    Forced sexual activity: Not  on file  Other Topics Concern  . Not on file  Social History Narrative  . Not on file    Family history: Negative/unremarkable except as detailed in HPI. No family history of birth defects.   Physical Exam: BP 135/75 (BP Location: Left Arm)   Pulse 91   Temp 98.6 F (37 C) (Oral)   Resp 18   Ht 5\' 7"  (1.702 m)   Wt 111.1 kg   LMP 08/18/2017   SpO2 100%   BMI 38.37 kg/m   Gen: NAD CV: Regular rate Pulm: No increased work of breathing Pelvic: 1/60/-2, unchanged from RN exam 1.5 hours ago Ext: No signs of DVT  NST: Baseline: 135 Variability: moderate Accelerations: present Decelerations: absent Tocometry: uterine irritability The patient was monitored for 30+ minutes, fetal heart rate tracing was deemed reactive.  Consults:  None  Significant Findings/ Diagnostic Studies: UA negative, urine culture pending  Procedures: NST  Discharge Condition: good  Disposition: Discharge disposition: 01-Home or Self Care       Diet: Regular diet  Discharge Activity: Activity as tolerated  Discharge Instructions    Discharge activity:  No Restrictions   Complete by:  As directed    Discharge diet:  No restrictions   Complete by:  As directed    Fetal Kick Count:  Lie on our left side for one hour after a meal, and count the number of times your baby kicks.  If it is less than 5 times, get up, move around and drink some juice.  Repeat the test 30 minutes later.  If it is still less than 5 kicks in an hour, notify your doctor.   Complete by:  As directed    LABOR:  When conractions begin, you should start to time them from the beginning of one contraction to the beginning  of the next.  When contractions are 5 - 10 minutes apart or less and have been regular for at least an hour, you should call your health care provider.   Complete by:  As directed    No sexual activity restrictions   Complete by:  As directed    Notify physician for bleeding from the vagina   Complete by:  As directed    Notify physician for blurring of vision or spots before the eyes   Complete by:  As directed    Notify physician for chills or fever   Complete by:  As directed    Notify physician for fainting spells, "black outs" or loss of consciousness   Complete by:  As directed    Notify physician for increase in vaginal discharge   Complete by:  As directed    Notify physician for leaking of fluid   Complete by:  As directed    Notify physician for pain or burning when urinating   Complete by:  As directed    Notify physician for pelvic pressure (sudden increase)   Complete by:  As directed    Notify physician for severe or continued nausea or vomiting   Complete by:  As directed    Notify physician for sudden gushing of fluid from  the vagina (with or without continued leaking)   Complete by:  As directed    Notify physician for sudden, constant, or occasional abdominal pain   Complete by:  As directed    Notify physician if baby moving less than usual   Complete by:  As directed      Allergies as of 05/03/2018  No Known Allergies     Medication List    TAKE these medications   aspirin EC 81 MG tablet Take 1 tablet (81 mg total) by mouth daily. Take after 12 weeks for prevention of preeclampssia later in pregnancy   CITRANATAL HARMONY 27-1-260 MG Caps Take 1 capsule by mouth daily.      UA negative, will call in Rx if urine culture results necessitate treatment. No cervical change in triage. Advised good hydration, rest, Tylenol, shower or bath, heating pad.  Keep scheduled ROB appointment tomorrow.  Signed: Rexene Agent, CNM  05/03/2018, 2:13 PM

## 2018-05-04 ENCOUNTER — Encounter: Payer: Self-pay | Admitting: Advanced Practice Midwife

## 2018-05-04 ENCOUNTER — Ambulatory Visit (INDEPENDENT_AMBULATORY_CARE_PROVIDER_SITE_OTHER): Payer: 59 | Admitting: Advanced Practice Midwife

## 2018-05-04 ENCOUNTER — Other Ambulatory Visit (HOSPITAL_COMMUNITY)
Admission: RE | Admit: 2018-05-04 | Discharge: 2018-05-04 | Disposition: A | Discharge status: Home / Self Care | Payer: 59 | Source: Ambulatory Visit | Attending: Advanced Practice Midwife | Admitting: Advanced Practice Midwife

## 2018-05-04 ENCOUNTER — Ambulatory Visit (INDEPENDENT_AMBULATORY_CARE_PROVIDER_SITE_OTHER): Payer: 59

## 2018-05-04 VITALS — BP 134/68 | Wt 239.0 lb

## 2018-05-04 DIAGNOSIS — Z3A37 37 weeks gestation of pregnancy: Secondary | ICD-10-CM | POA: Diagnosis not present

## 2018-05-04 DIAGNOSIS — O09523 Supervision of elderly multigravida, third trimester: Secondary | ICD-10-CM

## 2018-05-04 DIAGNOSIS — O99013 Anemia complicating pregnancy, third trimester: Secondary | ICD-10-CM | POA: Insufficient documentation

## 2018-05-04 DIAGNOSIS — O0990 Supervision of high risk pregnancy, unspecified, unspecified trimester: Secondary | ICD-10-CM

## 2018-05-04 DIAGNOSIS — O10913 Unspecified pre-existing hypertension complicating pregnancy, third trimester: Secondary | ICD-10-CM

## 2018-05-04 DIAGNOSIS — O34219 Maternal care for unspecified type scar from previous cesarean delivery: Secondary | ICD-10-CM

## 2018-05-04 DIAGNOSIS — Z3685 Encounter for antenatal screening for Streptococcus B: Secondary | ICD-10-CM

## 2018-05-04 DIAGNOSIS — Z113 Encounter for screening for infections with a predominantly sexual mode of transmission: Secondary | ICD-10-CM

## 2018-05-04 LAB — POCT URINALYSIS DIPSTICK OB
GLUCOSE, UA: NEGATIVE
POC,PROTEIN,UA: NEGATIVE

## 2018-05-04 LAB — FETAL NONSTRESS TEST

## 2018-05-04 NOTE — Progress Notes (Signed)
ROB Ultrasound/NST today GBS/Aptima

## 2018-05-04 NOTE — Progress Notes (Addendum)
Routine Prenatal Care Visit  Subjective  Danielle Romero is a 37 y.o. [phone] at [redacted]w[redacted]d being seen today for ongoing prenatal care.  She is currently monitored for the following issues for this high-risk pregnancy and has Breast mass, left; Abnormal mammogram of left breast; Supervision of high risk pregnancy, antepartum; History of cesarean delivery; Advanced maternal age in multigravida; 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; Heartburn during pregnancy in second trimester; Anemia of pregnancy; and Indication for care in labor and delivery, antepartum on their problem list.  ----------------------------------------------------------------------------------- Patient reports backache.  Comfort measures discussed. She has not noticed a change in how she feels following Fe infusions.  Contractions: Irregular. Vag. Bleeding: None.  Movement: Present. Denies leaking of fluid.  ----------------------------------------------------------------------------------- The following portions of the patient's history were reviewed and updated as appropriate: allergies, current medications, past family history, past medical history, past social history, past surgical history and problem list. Problem list updated.   Objective  Blood pressure 134/68, weight 239 lb (108.4 kg), last menstrual period 08/18/2017. Pregravid weight 235 lb (106.6 kg) Total Weight Gain 4 lb (1.814 kg) Urinalysis: Urine Protein Negative  Urine Glucose Negative  Fetal Status: Fetal Heart Rate (bpm): 145   Movement: Present  Presentation: Vertex  NST reactive 20 minute tracing, 145 bpm baseline, moderate variability, +accelerations, -decelerations, AFI 16.3  General:  Alert, oriented and cooperative. Patient is in no acute distress.  Skin: Skin is warm and dry. No rash noted.   Cardiovascular: Normal heart rate noted  Respiratory: Normal respiratory effort, no problems with respiration noted  Abdomen: Soft, gravid,  appropriate for gestational age. Pain/Pressure: Present     Pelvic:  Cervical exam performed 1.5/60/-2, posterior, GBS/aptima collected  Extremities: Normal range of motion.  Edema: Trace  Mental Status: Normal mood and affect. Normal behavior. Normal judgment and thought content.   Assessment   37 y.o. [id number] at [redacted]w[redacted]d by  05/25/2018, by Last Menstrual Period presenting for routine prenatal visit  Plan   pregnancy Problems (from 09/29/17 to present)    Problem Noted Resolved   Anemia of pregnancy 04/07/2018 by Will Bonnet, MD No   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 12/21/2017 by Homero Fellers, MD No   Overview Addendum 01/26/2018  9:21 AM by Rexene Agent, CNM    Labetalol 200mg  BID Daily 81 mg aspirin  [x ] Aspirin 81 mg daily after 12 weeks; discontinue after 36 weeks [x ] baseline labs with CBC, CMP, urine protein/creatinine ratio [ ]  ultrasound for growth at 28, 32, 36 weeks [ ]  Baseline EKG   Current antihypertensives:  Labetalol   Baseline and surveillance labs (pulled in from [facility], refresh links as needed)  Lab Results  Component Value Date   PLT 270 10/18/2017   CREATININE 0.50 10/18/2017   AST 17 10/18/2017   ALT 15 10/18/2017    Antenatal Testing CHTN - O10.919  Group I  BP < 140/90, no preeclampsia, AGA,  nml AFV, +/- meds    Group II BP > 140/90, on meds, no preeclampsia, AGA, nml AFV  20-28-34-38  20-24-28-32-35-38  32//2 x wk  28//BPP wkly then 32//2 x wk  40 no meds; 39 meds  PRN or 37  Pre-eclampsia  GHTN - O13.9/Preeclampsia without severe features  - O14.00   Preeclampsia with severe features - O14.10  Q 3-4wks  Q 2 wks  28//BPP wkly then 32//2 x wk  Inpatient  36  PRN or 33  History of cesarean delivery 10/06/2017 by Will Bonnet, MD No   Advanced maternal age in multigravida 10/06/2017 by Will Bonnet, MD No   Supervision of high risk pregnancy, antepartum 09/29/2017 by Rod Can,  CNM No   Overview Addendum 02/23/2018 10:55 AM by Dalia Heading, [place] Prenatal Labs  Dating  LMP=6week [place] Blood type: A/Positive/-- (03/27 1115)   Genetic Screen NIPS: collected 11/03/17 Antibody:Negative (03/27 1115)  Anatomic [place] Complete 01/26/2018 Rubella: 4.84 (03/27 1115) Varicella: Immune  GTT Early: 109               Third trimester:  RPR: Non Reactive (03/27 1115)   Rhogam  Not needed HBsAg: Negative (03/27 1115)   TDaP vaccine   Flu Shot: declined HIV: Non Reactive (03/27 1115)   Baby Food         Breast                       GBS:   Contraception  Pap:NIL 12/01/2017  CBB     CS/VBAC With G4/VBAC G5   Support Person Legrand Como               Term labor symptoms and general obstetric precautions including but not limited to vaginal bleeding, contractions, leaking of fluid and fetal movement were reviewed in detail with the patient. Please refer to After Visit Summary for other counseling recommendations.   Return in about 1 week (around 05/11/2018) for HROB afi/nst/rob.  Rod Can, CNM 05/04/2018 10:38 AM

## 2018-05-04 NOTE — Patient Instructions (Signed)
Vaginal Delivery Vaginal delivery means that you will give birth by pushing your baby out of your birth canal (vagina). A team of health care providers will help you before, during, and after vaginal delivery. Birth experiences are unique for every woman and every pregnancy, and birth experiences vary depending on where you choose to give birth. What should I do to prepare for my baby's birth? Before your baby is born, it is important to talk with your health care provider about:  Your labor and delivery preferences. These may include: ? Medicines that you may be given. ? How you will manage your pain. This might include non-medical pain relief techniques or injectable pain relief such as epidural analgesia. ? How you and your baby will be monitored during labor and delivery. ? Who may be in the labor and delivery room with you. ? Your feelings about surgical delivery of your baby (cesarean delivery, or C-section) if this becomes necessary. ? Your feelings about receiving donated blood through an IV tube (blood transfusion) if this becomes necessary.  Whether you are able: ? To take pictures or videos of the birth. ? To eat during labor and delivery. ? To move around, walk, or change positions during labor and delivery.  What to expect after your baby is born, such as: ? Whether delayed umbilical cord clamping and cutting is offered. ? Who will care for your baby right after birth. ? Medicines or tests that may be recommended for your baby. ? Whether breastfeeding is supported in your hospital or birth center. ? How long you will be in the hospital or birth center.  How any medical conditions you have may affect your baby or your labor and delivery experience.  To prepare for your baby's birth, you should also:  Attend all of your health care visits before delivery (prenatal visits) as recommended by your health care provider. This is important.  Prepare your home for your baby's  arrival. Make sure that you have: ? Diapers. ? Baby clothing. ? Feeding equipment. ? Safe sleeping arrangements for you and your baby.  Install a car seat in your vehicle. Have your car seat checked by a certified car seat installer to make sure that it is installed safely.  Think about who will help you with your new baby at home for at least the first several weeks after delivery.  What can I expect when I arrive at the birth center or hospital? Once you are in labor and have been admitted into the hospital or birth center, your health care provider may:  Review your pregnancy history and any concerns you have.  Insert an IV tube into one of your veins. This is used to give you fluids and medicines.  Check your blood pressure, pulse, temperature, and heart rate (vital signs).  Check whether your bag of water (amniotic sac) has broken (ruptured).  Talk with you about your birth plan and discuss pain control options.  Monitoring Your health care provider may monitor your contractions (uterine monitoring) and your baby's heart rate (fetal monitoring). You may need to be monitored:  Often, but not continuously (intermittently).  All the time or for long periods at a time (continuously). Continuous monitoring may be needed if: ? You are taking certain medicines, such as medicine to relieve pain or make your contractions stronger. ? You have pregnancy or labor complications.  Monitoring may be done by:  Placing a special stethoscope or a handheld monitoring device on your abdomen to   check your baby's heartbeat, and feeling your abdomen for contractions. This method of monitoring does not continuously record your baby's heartbeat or your contractions.  Placing monitors on your abdomen (external monitors) to record your baby's heartbeat and the frequency and length of contractions. You may not have to wear external monitors all the time.  Placing monitors inside of your uterus  (internal monitors) to record your baby's heartbeat and the frequency, length, and strength of your contractions. ? Your health care provider may use internal monitors if he or she needs more information about the strength of your contractions or your baby's heart rate. ? Internal monitors are put in place by passing a thin, flexible wire through your vagina and into your uterus. Depending on the type of monitor, it may remain in your uterus or on your baby's head until birth. ? Your health care provider will discuss the benefits and risks of internal monitoring with you and will ask for your permission before inserting the monitors.  Telemetry. This is a type of continuous monitoring that can be done with external or internal monitors. Instead of having to stay in bed, you are able to move around during telemetry. Ask your health care provider if telemetry is an option for you.  Physical exam Your health care provider may perform a physical exam. This may include:  Checking whether your baby is positioned: ? With the head toward your vagina (head-down). This is most common. ? With the head toward the top of your uterus (head-up or breech). If your baby is in a breech position, your health care provider may try to turn your baby to a head-down position so you can deliver vaginally. If it does not seem that your baby can be born vaginally, your provider may recommend surgery to deliver your baby. In rare cases, you may be able to deliver vaginally if your baby is head-up (breech delivery). ? Lying sideways (transverse). Babies that are lying sideways cannot be delivered vaginally.  Checking your cervix to determine: ? Whether it is thinning out (effacing). ? Whether it is opening up (dilating). ? How low your baby has moved into your birth canal.  What are the three stages of labor and delivery?  Normal labor and delivery is divided into the following three stages: Stage 1  Stage 1 is the  longest stage of labor, and it can last for hours or days. Stage 1 includes: ? Early labor. This is when contractions may be irregular, or regular and mild. Generally, early labor contractions are more than 10 minutes apart. ? Active labor. This is when contractions get longer, more regular, more frequent, and more intense. ? The transition phase. This is when contractions happen very close together, are very intense, and may last longer than during any other part of labor.  Contractions generally feel mild, infrequent, and irregular at first. They get stronger, more frequent (about every 2-3 minutes), and more regular as you progress from early labor through active labor and transition.  Many women progress through stage 1 naturally, but you may need help to continue making progress. If this happens, your health care provider may talk with you about: ? Rupturing your amniotic sac if it has not ruptured yet. ? Giving you medicine to help make your contractions stronger and more frequent.  Stage 1 ends when your cervix is completely dilated to 4 inches (10 cm) and completely effaced. This happens at the end of the transition phase. Stage 2  Once   your cervix is completely effaced and dilated to 4 inches (10 cm), you may start to feel an urge to push. It is common for the body to naturally take a rest before feeling the urge to push, especially if you received an epidural or certain other pain medicines. This rest period may last for up to 1-2 hours, depending on your unique labor experience.  During stage 2, contractions are generally less painful, because pushing helps relieve contraction pain. Instead of contraction pain, you may feel stretching and burning pain, especially when the widest part of your baby's head passes through the vaginal opening (crowning).  Your health care provider will closely monitor your pushing progress and your baby's progress through the vagina during stage 2.  Your  health care provider may massage the area of skin between your vaginal opening and anus (perineum) or apply warm compresses to your perineum. This helps it stretch as the baby's head starts to crown, which can help prevent perineal tearing. ? In some cases, an incision may be made in your perineum (episiotomy) to allow the baby to pass through the vaginal opening. An episiotomy helps to make the opening of the vagina larger to allow more room for the baby to fit through.  It is very important to breathe and focus so your health care provider can control the delivery of your baby's head. Your health care provider may have you decrease the intensity of your pushing, to help prevent perineal tearing.  After delivery of your baby's head, the shoulders and the rest of the body generally deliver very quickly and without difficulty.  Once your baby is delivered, the umbilical cord may be cut right away, or this may be delayed for 1-2 minutes, depending on your baby's health. This may vary among health care providers, hospitals, and birth centers.  If you and your baby are healthy enough, your baby may be placed on your chest or abdomen to help maintain the baby's temperature and to help you bond with each other. Some mothers and babies start breastfeeding at this time. Your health care team will dry your baby and help keep your baby warm during this time.  Your baby may need immediate care if he or she: ? Showed signs of distress during labor. ? Has a medical condition. ? Was born too early (prematurely). ? Had a bowel movement before birth (meconium). ? Shows signs of difficulty transitioning from being inside the uterus to being outside of the uterus. If you are planning to breastfeed, your health care team will help you begin a feeding. Stage 3  The third stage of labor starts immediately after the birth of your baby and ends after you deliver the placenta. The placenta is an organ that develops  during pregnancy to provide oxygen and nutrients to your baby in the womb.  Delivering the placenta may require some pushing, and you may have mild contractions. Breastfeeding can stimulate contractions to help you deliver the placenta.  After the placenta is delivered, your uterus should tighten (contract) and become firm. This helps to stop bleeding in your uterus. To help your uterus contract and to control bleeding, your health care provider may: ? Give you medicine by injection, through an IV tube, by mouth, or through your rectum (rectally). ? Massage your abdomen or perform a vaginal exam to remove any blood clots that are left in your uterus. ? Empty your bladder by placing a thin, flexible tube (catheter) into your bladder. ? Encourage   you to breastfeed your baby. After labor is over, you and your baby will be monitored closely to ensure that you are both healthy until you are ready to go home. Your health care team will teach you how to care for yourself and your baby. This information is not intended to replace advice given to you by your health care provider. Make sure you discuss any questions you have with your health care provider. Document Released: 04/07/2008 Document Revised: 01/17/2016 Document Reviewed: 07/14/2015 Elsevier Interactive Patient Education  2018 [employer]  

## 2018-05-05 ENCOUNTER — Other Ambulatory Visit: Payer: Self-pay | Admitting: Obstetrics and Gynecology

## 2018-05-05 DIAGNOSIS — O2343 Unspecified infection of urinary tract in pregnancy, third trimester: Secondary | ICD-10-CM

## 2018-05-05 LAB — URINE CULTURE

## 2018-05-05 MED ORDER — NITROFURANTOIN MONOHYD MACRO 100 MG PO CAPS: 100.0000 mg | ORAL_CAPSULE | Freq: Two times a day (BID) | ORAL | 1 refills | Status: DC

## 2018-05-05 NOTE — Progress Notes (Signed)
Hi Jaci,  You are seeing this patient next week. Thank you,  Dr. Gilman Schmidt

## 2018-05-05 NOTE — Progress Notes (Signed)
Called no ansswer, left message. Message sent to mychart. Antibiotic sent to pharmacy.

## 2018-05-06 ENCOUNTER — Telehealth: Payer: Self-pay

## 2018-05-06 LAB — CERVICOVAGINAL ANCILLARY ONLY
Chlamydia: NEGATIVE
Neisseria Gonorrhea: NEGATIVE
Trichomonas: NEGATIVE

## 2018-05-06 LAB — STREP GP B NAA: STREP GROUP B AG: NEGATIVE

## 2018-05-06 NOTE — Telephone Encounter (Signed)
Please call pt regarding her dates on her FMLA , she states the paper need to give her 3 episodes a week, pt aware you will call her

## 2018-05-09 ENCOUNTER — Inpatient Hospital Stay: Payer: 59

## 2018-05-09 VITALS — BP 124/79 | HR 94 | Temp 97.9°F | Resp 18

## 2018-05-09 DIAGNOSIS — O99013 Anemia complicating pregnancy, third trimester: Secondary | ICD-10-CM

## 2018-05-09 DIAGNOSIS — D509 Iron deficiency anemia, unspecified: Secondary | ICD-10-CM | POA: Diagnosis not present

## 2018-05-09 MED ORDER — SODIUM CHLORIDE 0.9 % IV SOLN
Freq: Once | INTRAVENOUS | Status: AC
  Administered 2018-05-09: 14:00:00 via INTRAVENOUS
  Filled 2018-05-09: qty 250

## 2018-05-09 MED ORDER — IRON SUCROSE 20 MG/ML IV SOLN
200.0000 mg | Freq: Once | INTRAVENOUS | Status: AC
  Administered 2018-05-09: 200 mg via INTRAVENOUS
  Filled 2018-05-09: qty 10

## 2018-05-10 ENCOUNTER — Encounter: Payer: Self-pay | Admitting: Maternal Newborn

## 2018-05-10 ENCOUNTER — Ambulatory Visit (INDEPENDENT_AMBULATORY_CARE_PROVIDER_SITE_OTHER): Payer: 59

## 2018-05-10 ENCOUNTER — Ambulatory Visit (INDEPENDENT_AMBULATORY_CARE_PROVIDER_SITE_OTHER): Payer: 59 | Admitting: Maternal Newborn

## 2018-05-10 VITALS — BP 114/80 | Wt 235.5 lb

## 2018-05-10 DIAGNOSIS — Z3689 Encounter for other specified antenatal screening: Secondary | ICD-10-CM

## 2018-05-10 DIAGNOSIS — O10913 Unspecified pre-existing hypertension complicating pregnancy, third trimester: Secondary | ICD-10-CM | POA: Diagnosis not present

## 2018-05-10 DIAGNOSIS — O0990 Supervision of high risk pregnancy, unspecified, unspecified trimester: Secondary | ICD-10-CM

## 2018-05-10 DIAGNOSIS — O99013 Anemia complicating pregnancy, third trimester: Secondary | ICD-10-CM | POA: Diagnosis not present

## 2018-05-10 DIAGNOSIS — O09523 Supervision of elderly multigravida, third trimester: Secondary | ICD-10-CM

## 2018-05-10 DIAGNOSIS — O34219 Maternal care for unspecified type scar from previous cesarean delivery: Secondary | ICD-10-CM | POA: Diagnosis not present

## 2018-05-10 DIAGNOSIS — Z3A37 37 weeks gestation of pregnancy: Secondary | ICD-10-CM

## 2018-05-10 LAB — FETAL NONSTRESS TEST

## 2018-05-10 LAB — POCT URINALYSIS DIPSTICK OB
Glucose, UA: NEGATIVE
PROTEIN: NEGATIVE

## 2018-05-10 NOTE — Progress Notes (Signed)
Routine Prenatal Care Visit  Subjective  Danielle Romero is a 37 y.o. [phone] at [redacted]w[redacted]d being seen today for ongoing prenatal care.  She is currently monitored for the following issues for this high-risk pregnancy and has Breast mass, left; Abnormal mammogram of left breast; Supervision of high risk pregnancy, antepartum; History of cesarean delivery; Advanced maternal age in multigravida; 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; Heartburn during pregnancy in second trimester; and Anemia of pregnancy on their problem list.  ----------------------------------------------------------------------------------- Patient reports URI symptoms that began on Monday.  She has coughing, sneezing, congestion and clear drainage. Afebrile. Contractions: Irregular. Vag. Bleeding: None.  Movement: Present. No leaking of fluid.  ----------------------------------------------------------------------------------- The following portions of the patient's history were reviewed and updated as appropriate: allergies, current medications, past family history, past medical history, past social history, past surgical history and problem list. Problem list updated.   Objective  Blood pressure 114/80, weight 235 lb 8 oz (106.8 kg), last menstrual period 08/18/2017. Pregravid weight 235 lb (106.6 kg) Total Weight Gain 8 oz (0.227 kg) Body mass index is 36.88 kg/m.   Urinalysis: Protein Negative, Glucose Negative Fetal Status: Fetal Heart Rate (bpm): 145 Fundal Height: 40 cm Movement: Present     General:  Alert, oriented and cooperative. Patient is in no acute distress.  Skin: Skin is warm and dry. No rash noted.   Cardiovascular: Normal heart rate noted  Respiratory: Normal respiratory effort, no problems with respiration noted  Abdomen: Soft, gravid, appropriate for gestational age. Pain/Pressure: Present     Pelvic:  Cervical exam performed        Extremities: Normal range of motion.  Edema: Trace    Mental Status: Normal mood and affect. Normal behavior. Normal judgment and thought content.   Cervix difficult to reach, soft but posterior. It could be that the internal os is dilated consistent with prior exam, but could only barely reach external os today.  NST Baseline: 145 Variability: moderate Accelerations: present Decelerations: absent Tocometry: not done The patient was monitored for 20+ minutes, fetal heart rate tracing was deemed reactive.  Assessment   37 y.o. [id number] at [redacted]w[redacted]d, EDD 05/25/2018, by Last Menstrual Period presenting for a routine prenatal visit.  Plan   pregnancy Problems (from 09/29/17 to present)    Problem Noted Resolved   Anemia of pregnancy 04/07/2018 by Will Bonnet, MD No   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 12/21/2017 by Homero Fellers, MD No   Overview Addendum 01/26/2018  9:21 AM by Rexene Agent, CNM    Labetalol 200mg  BID Daily 81 mg aspirin  [x ] Aspirin 81 mg daily after 12 weeks; discontinue after 36 weeks [x ] baseline labs with CBC, CMP, urine protein/creatinine ratio [ ]  ultrasound for growth at 28, 32, 36 weeks [ ]  Baseline EKG   Current antihypertensives:  Labetalol   Baseline and surveillance labs (pulled in from [facility], refresh links as needed)  Lab Results  Component Value Date   PLT 270 10/18/2017   CREATININE 0.50 10/18/2017   AST 17 10/18/2017   ALT 15 10/18/2017    Antenatal Testing CHTN - O10.919  Group I  BP < 140/90, no preeclampsia, AGA,  nml AFV, +/- meds    Group II BP > 140/90, on meds, no preeclampsia, AGA, nml AFV  20-28-34-38  20-24-28-32-35-38  32//2 x wk  28//BPP wkly then 32//2 x wk  40 no meds; 39 meds  PRN or 37  Pre-eclampsia  GHTN - O13.9/Preeclampsia  without severe features  - O14.00   Preeclampsia with severe features - O14.10  Q 3-4wks  Q 2 wks  28//BPP wkly then 32//2 x wk  Inpatient  37  PRN or 34        History of cesarean delivery 10/06/2017  by Will Bonnet, MD No   Advanced maternal age in multigravida 10/06/2017 by Will Bonnet, MD No   Supervision of high risk pregnancy, antepartum 09/29/2017 by Rod Can, CNM No   Overview Addendum 02/23/2018 10:55 AM by Dalia Heading, [place] Prenatal Labs  Dating  LMP=6week [place] Blood type: A/Positive/-- (03/27 1115)   Genetic Screen NIPS: collected 11/03/17 Antibody:Negative (03/27 1115)  Anatomic [place] Complete 01/26/2018 Rubella: 4.84 (03/27 1115) Varicella: Immune  GTT Early: 35               Third trimester:  RPR: Non Reactive (03/27 1115)   Rhogam  Not needed HBsAg: Negative (03/27 1115)   TDaP vaccine   Flu Shot: declined HIV: Non Reactive (03/27 1115)   Baby Food         Breast                       GBS:   Contraception  Pap:NIL 12/01/2017  CBB     CS/VBAC With G4/VBAC G5   Support Person Michael             AFI 12.8 cm, NST reactive after vibroacoustic stimulation.  Discussed OTC medications for relief of viral URI symptoms.  Term labor symptoms and general obstetric precautions were reviewed.  Please refer to After Visit Summary for other counseling recommendations.   Return in about 1 week (around 05/17/2018) for HROB NST and AFI.  Avel Sensor, CNM 05/10/2018  4:52 PM

## 2018-05-10 NOTE — Patient Instructions (Signed)
Vaginal Delivery Vaginal delivery means that you will give birth by pushing your baby out of your birth canal (vagina). A team of health care providers will help you before, during, and after vaginal delivery. Birth experiences are unique for every woman and every pregnancy, and birth experiences vary depending on where you choose to give birth. What should I do to prepare for my baby's birth? Before your baby is born, it is important to talk with your health care provider about:  Your labor and delivery preferences. These may include: ? Medicines that you may be given. ? How you will manage your pain. This might include non-medical pain relief techniques or injectable pain relief such as epidural analgesia. ? How you and your baby will be monitored during labor and delivery. ? Who may be in the labor and delivery room with you. ? Your feelings about surgical delivery of your baby (cesarean delivery, or C-section) if this becomes necessary. ? Your feelings about receiving donated blood through an IV tube (blood transfusion) if this becomes necessary.  Whether you are able: ? To take pictures or videos of the birth. ? To eat during labor and delivery. ? To move around, walk, or change positions during labor and delivery.  What to expect after your baby is born, such as: ? Whether delayed umbilical cord clamping and cutting is offered. ? Who will care for your baby right after birth. ? Medicines or tests that may be recommended for your baby. ? Whether breastfeeding is supported in your hospital or birth center. ? How long you will be in the hospital or birth center.  How any medical conditions you have may affect your baby or your labor and delivery experience.  To prepare for your baby's birth, you should also:  Attend all of your health care visits before delivery (prenatal visits) as recommended by your health care provider. This is important.  Prepare your home for your baby's  arrival. Make sure that you have: ? Diapers. ? Baby clothing. ? Feeding equipment. ? Safe sleeping arrangements for you and your baby.  Install a car seat in your vehicle. Have your car seat checked by a certified car seat installer to make sure that it is installed safely.  Think about who will help you with your new baby at home for at least the first several weeks after delivery.  What can I expect when I arrive at the birth center or hospital? Once you are in labor and have been admitted into the hospital or birth center, your health care provider may:  Review your pregnancy history and any concerns you have.  Insert an IV tube into one of your veins. This is used to give you fluids and medicines.  Check your blood pressure, pulse, temperature, and heart rate (vital signs).  Check whether your bag of water (amniotic sac) has broken (ruptured).  Talk with you about your birth plan and discuss pain control options.  Monitoring Your health care provider may monitor your contractions (uterine monitoring) and your baby's heart rate (fetal monitoring). You may need to be monitored:  Often, but not continuously (intermittently).  All the time or for long periods at a time (continuously). Continuous monitoring may be needed if: ? You are taking certain medicines, such as medicine to relieve pain or make your contractions stronger. ? You have pregnancy or labor complications.  Monitoring may be done by:  Placing a special stethoscope or a handheld monitoring device on your abdomen to   check your baby's heartbeat, and feeling your abdomen for contractions. This method of monitoring does not continuously record your baby's heartbeat or your contractions.  Placing monitors on your abdomen (external monitors) to record your baby's heartbeat and the frequency and length of contractions. You may not have to wear external monitors all the time.  Placing monitors inside of your uterus  (internal monitors) to record your baby's heartbeat and the frequency, length, and strength of your contractions. ? Your health care provider may use internal monitors if he or she needs more information about the strength of your contractions or your baby's heart rate. ? Internal monitors are put in place by passing a thin, flexible wire through your vagina and into your uterus. Depending on the type of monitor, it may remain in your uterus or on your baby's head until birth. ? Your health care provider will discuss the benefits and risks of internal monitoring with you and will ask for your permission before inserting the monitors.  Telemetry. This is a type of continuous monitoring that can be done with external or internal monitors. Instead of having to stay in bed, you are able to move around during telemetry. Ask your health care provider if telemetry is an option for you.  Physical exam Your health care provider may perform a physical exam. This may include:  Checking whether your baby is positioned: ? With the head toward your vagina (head-down). This is most common. ? With the head toward the top of your uterus (head-up or breech). If your baby is in a breech position, your health care provider may try to turn your baby to a head-down position so you can deliver vaginally. If it does not seem that your baby can be born vaginally, your provider may recommend surgery to deliver your baby. In rare cases, you may be able to deliver vaginally if your baby is head-up (breech delivery). ? Lying sideways (transverse). Babies that are lying sideways cannot be delivered vaginally.  Checking your cervix to determine: ? Whether it is thinning out (effacing). ? Whether it is opening up (dilating). ? How low your baby has moved into your birth canal.  What are the three stages of labor and delivery?  Normal labor and delivery is divided into the following three stages: Stage 1  Stage 1 is the  longest stage of labor, and it can last for hours or days. Stage 1 includes: ? Early labor. This is when contractions may be irregular, or regular and mild. Generally, early labor contractions are more than 10 minutes apart. ? Active labor. This is when contractions get longer, more regular, more frequent, and more intense. ? The transition phase. This is when contractions happen very close together, are very intense, and may last longer than during any other part of labor.  Contractions generally feel mild, infrequent, and irregular at first. They get stronger, more frequent (about every 2-3 minutes), and more regular as you progress from early labor through active labor and transition.  Many women progress through stage 1 naturally, but you may need help to continue making progress. If this happens, your health care provider may talk with you about: ? Rupturing your amniotic sac if it has not ruptured yet. ? Giving you medicine to help make your contractions stronger and more frequent.  Stage 1 ends when your cervix is completely dilated to 4 inches (10 cm) and completely effaced. This happens at the end of the transition phase. Stage 2  Once   your cervix is completely effaced and dilated to 4 inches (10 cm), you may start to feel an urge to push. It is common for the body to naturally take a rest before feeling the urge to push, especially if you received an epidural or certain other pain medicines. This rest period may last for up to 1-2 hours, depending on your unique labor experience.  During stage 2, contractions are generally less painful, because pushing helps relieve contraction pain. Instead of contraction pain, you may feel stretching and burning pain, especially when the widest part of your baby's head passes through the vaginal opening (crowning).  Your health care provider will closely monitor your pushing progress and your baby's progress through the vagina during stage 2.  Your  health care provider may massage the area of skin between your vaginal opening and anus (perineum) or apply warm compresses to your perineum. This helps it stretch as the baby's head starts to crown, which can help prevent perineal tearing. ? In some cases, an incision may be made in your perineum (episiotomy) to allow the baby to pass through the vaginal opening. An episiotomy helps to make the opening of the vagina larger to allow more room for the baby to fit through.  It is very important to breathe and focus so your health care provider can control the delivery of your baby's head. Your health care provider may have you decrease the intensity of your pushing, to help prevent perineal tearing.  After delivery of your baby's head, the shoulders and the rest of the body generally deliver very quickly and without difficulty.  Once your baby is delivered, the umbilical cord may be cut right away, or this may be delayed for 1-2 minutes, depending on your baby's health. This may vary among health care providers, hospitals, and birth centers.  If you and your baby are healthy enough, your baby may be placed on your chest or abdomen to help maintain the baby's temperature and to help you bond with each other. Some mothers and babies start breastfeeding at this time. Your health care team will dry your baby and help keep your baby warm during this time.  Your baby may need immediate care if he or she: ? Showed signs of distress during labor. ? Has a medical condition. ? Was born too early (prematurely). ? Had a bowel movement before birth (meconium). ? Shows signs of difficulty transitioning from being inside the uterus to being outside of the uterus. If you are planning to breastfeed, your health care team will help you begin a feeding. Stage 3  The third stage of labor starts immediately after the birth of your baby and ends after you deliver the placenta. The placenta is an organ that develops  during pregnancy to provide oxygen and nutrients to your baby in the womb.  Delivering the placenta may require some pushing, and you may have mild contractions. Breastfeeding can stimulate contractions to help you deliver the placenta.  After the placenta is delivered, your uterus should tighten (contract) and become firm. This helps to stop bleeding in your uterus. To help your uterus contract and to control bleeding, your health care provider may: ? Give you medicine by injection, through an IV tube, by mouth, or through your rectum (rectally). ? Massage your abdomen or perform a vaginal exam to remove any blood clots that are left in your uterus. ? Empty your bladder by placing a thin, flexible tube (catheter) into your bladder. ? Encourage   you to breastfeed your baby. After labor is over, you and your baby will be monitored closely to ensure that you are both healthy until you are ready to go home. Your health care team will teach you how to care for yourself and your baby. This information is not intended to replace advice given to you by your health care provider. Make sure you discuss any questions you have with your health care provider. Document Released: 04/07/2008 Document Revised: 01/17/2016 Document Reviewed: 07/14/2015 Elsevier Interactive Patient Education  2018 [employer]  

## 2018-05-10 NOTE — Progress Notes (Signed)
C/o cold sxs - drainage, sore throat, coughing/sneezing; no fever; want's cx [email or web address]

## 2018-05-16 ENCOUNTER — Inpatient Hospital Stay: Payer: 59 | Attending: Oncology

## 2018-05-16 VITALS — BP 113/80 | HR 96 | Temp 98.2°F | Resp 18

## 2018-05-16 DIAGNOSIS — D509 Iron deficiency anemia, unspecified: Secondary | ICD-10-CM | POA: Diagnosis present

## 2018-05-16 DIAGNOSIS — O99013 Anemia complicating pregnancy, third trimester: Secondary | ICD-10-CM

## 2018-05-16 MED ORDER — SODIUM CHLORIDE 0.9 % IV SOLN
Freq: Once | INTRAVENOUS | Status: AC
  Administered 2018-05-16: 14:00:00 via INTRAVENOUS
  Filled 2018-05-16: qty 250

## 2018-05-16 MED ORDER — IRON SUCROSE 20 MG/ML IV SOLN
200.0000 mg | Freq: Once | INTRAVENOUS | Status: AC
  Administered 2018-05-16: 200 mg via INTRAVENOUS
  Filled 2018-05-16: qty 5

## 2018-05-17 ENCOUNTER — Other Ambulatory Visit: Payer: Self-pay | Admitting: Maternal Newborn

## 2018-05-17 DIAGNOSIS — O0993 Supervision of high risk pregnancy, unspecified, third trimester: Secondary | ICD-10-CM

## 2018-05-18 ENCOUNTER — Ambulatory Visit (INDEPENDENT_AMBULATORY_CARE_PROVIDER_SITE_OTHER): Payer: 59 | Admitting: Maternal Newborn

## 2018-05-18 ENCOUNTER — Ambulatory Visit (INDEPENDENT_AMBULATORY_CARE_PROVIDER_SITE_OTHER): Payer: 59

## 2018-05-18 ENCOUNTER — Encounter: Payer: Self-pay | Admitting: Maternal Newborn

## 2018-05-18 VITALS — BP 140/80 | Wt 238.0 lb

## 2018-05-18 DIAGNOSIS — O10913 Unspecified pre-existing hypertension complicating pregnancy, third trimester: Secondary | ICD-10-CM

## 2018-05-18 DIAGNOSIS — O34219 Maternal care for unspecified type scar from previous cesarean delivery: Secondary | ICD-10-CM

## 2018-05-18 DIAGNOSIS — O09523 Supervision of elderly multigravida, third trimester: Secondary | ICD-10-CM

## 2018-05-18 DIAGNOSIS — Z3689 Encounter for other specified antenatal screening: Secondary | ICD-10-CM

## 2018-05-18 DIAGNOSIS — O0990 Supervision of high risk pregnancy, unspecified, unspecified trimester: Secondary | ICD-10-CM

## 2018-05-18 DIAGNOSIS — Z3A39 39 weeks gestation of pregnancy: Secondary | ICD-10-CM

## 2018-05-18 DIAGNOSIS — O99013 Anemia complicating pregnancy, third trimester: Secondary | ICD-10-CM | POA: Diagnosis not present

## 2018-05-18 DIAGNOSIS — O0993 Supervision of high risk pregnancy, unspecified, third trimester: Secondary | ICD-10-CM

## 2018-05-18 LAB — FETAL NONSTRESS TEST

## 2018-05-18 LAB — POCT URINALYSIS DIPSTICK OB
GLUCOSE, UA: NEGATIVE
PROTEIN: NEGATIVE

## 2018-05-18 NOTE — Patient Instructions (Signed)
Labor Induction Labor induction is when steps are taken to cause a pregnant woman to begin the labor process. Most women go into labor on their own between 37 weeks and 42 weeks of the pregnancy. When this does not happen or when there is a medical need, methods may be used to induce labor. Labor induction causes a pregnant woman's uterus to contract. It also causes the cervix to soften (ripen), open (dilate), and thin out (efface). Usually, labor is not induced before 39 weeks of the pregnancy unless there is a problem with the baby or mother. Before inducing labor, your health care provider will consider a number of factors, including the following:  The medical condition of you and the baby.  How many weeks along you are.  The status of the baby's lung maturity.  The condition of the cervix.  The position of the baby. What are the reasons for labor induction? Labor may be induced for the following reasons:  The health of the baby or mother is at risk.  The pregnancy is overdue by 1 week or more.  The water breaks but labor does not start on its own.  The mother has a health condition or serious illness, such as high blood pressure, infection, placental abruption, or diabetes.  The amniotic fluid amounts are low around the baby.  The baby is distressed. Convenience or wanting the baby to be born on a certain date is not a reason for inducing labor. What methods are used for labor induction? Several methods of labor induction may be used, such as:  Prostaglandin medicine. This medicine causes the cervix to dilate and ripen. The medicine will also start contractions. It can be taken by mouth or by inserting a suppository into the vagina.  Inserting a thin tube (catheter) with a balloon on the end into the vagina to dilate the cervix. Once inserted, the balloon is expanded with water, which causes the cervix to open.  Stripping the membranes. Your health care provider separates  amniotic sac tissue from the cervix, causing the cervix to be stretched and causing the release of a hormone called progesterone. This may cause the uterus to contract. It is often done during an office visit. You will be sent home to wait for the contractions to begin. You will then come in for an induction.  Breaking the water. Your health care provider makes a hole in the amniotic sac using a small instrument. Once the amniotic sac breaks, contractions should begin. This may still take hours to see an effect.  Medicine to trigger or strengthen contractions. This medicine is given through an IV access tube inserted into a vein in your arm. All of the methods of induction, besides stripping the membranes, will be done in the hospital. Induction is done in the hospital so that you and the baby can be carefully monitored. How long does it take for labor to be induced? Some inductions can take up to 2-3 days. Depending on the cervix, it usually takes less time. It takes longer when you are induced early in the pregnancy or if this is your first pregnancy. If a mother is still pregnant and the induction has been going on for 2-3 days, either the mother will be sent home or a cesarean delivery will be needed. What are the risks associated with labor induction? Some of the risks of induction include:  Changes in fetal heart rate, such as too high, too low, or erratic.  Fetal distress.    Chance of infection for the mother and baby.  Increased chance of having a cesarean delivery.  Breaking off (abruption) of the placenta from the uterus (rare).  Uterine rupture (very rare). When induction is needed for medical reasons, the benefits of induction may outweigh the risks. What are some reasons for not inducing labor? Labor induction should not be done if:  It is shown that your baby does not tolerate labor.  You have had previous surgeries on your uterus, such as a myomectomy or the removal of  fibroids.  Your placenta lies very low in the uterus and blocks the opening of the cervix (placenta previa).  Your baby is not in a head-down position.  The umbilical cord drops down into the birth canal in front of the baby. This could cut off the baby's blood and oxygen supply.  You have had a previous cesarean delivery.  There are unusual circumstances, such as the baby being extremely premature. This information is not intended to replace advice given to you by your health care provider. Make sure you discuss any questions you have with your health care provider. Document Released: 11/18/2006 Document Revised: 12/05/2015 Document Reviewed: 01/26/2013 Elsevier Interactive Patient Education  2017 [employer]  

## 2018-05-18 NOTE — Progress Notes (Signed)
Routine Prenatal Care Visit  Subjective  Danielle Romero is a 37 y.o. [phone] at [redacted]w[redacted]d being seen today for ongoing prenatal care.  She is currently monitored for the following issues for this high-risk pregnancy and has Breast mass, left; Abnormal mammogram of left breast; Supervision of high risk pregnancy, antepartum; History of cesarean delivery; Advanced maternal age in multigravida; 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; Heartburn during pregnancy in second trimester; and Anemia of pregnancy on their problem list.  ----------------------------------------------------------------------------------- Patient reports increasing pelvic pressure and shooting pains in the vaginal area.   Contractions: Irregular. Vag. Bleeding: None.  Movement: Present. No leaking of fluid.  ----------------------------------------------------------------------------------- The following portions of the patient's history were reviewed and updated as appropriate: allergies, current medications, past family history, past medical history, past social history, past surgical history and problem list. Problem list updated.  Objective  Blood pressure 140/80, weight 238 lb (108 kg), last menstrual period 08/18/2017. Pregravid weight 235 lb (106.6 kg) Total Weight Gain 3 lb (1.361 kg) Body mass index is 37.28 kg/m.   Urinalysis: Protein Negative, Glucose Negative  Fetal Status: Fetal Heart Rate (bpm): 135   Movement: Present  Presentation: Vertex  General:  Alert, oriented and cooperative. Patient is in no acute distress.  Skin: Skin is warm and dry. No rash noted.   Cardiovascular: Normal heart rate noted  Respiratory: Normal respiratory effort, no problems with respiration noted  Abdomen: Soft, gravid, appropriate for gestational age. Pain/Pressure: Present     Pelvic:  Cervical exam performed Dilation: 1.5 Effacement (%): 50 Station: -2  Extremities: Normal range of motion.  Edema: Trace  Mental  Status: Normal mood and affect. Normal behavior. Normal judgment and thought content.   NST: Baseline: 135 Variability: moderate Accelerations: present Decelerations: present, 1 variable with good return to baseline Tocometry: not done The patient was monitored for 20+ minutes, fetal heart rate tracing was deemed reactive.  Assessment   37 y.o. [id number] at [redacted]w[redacted]d, EDD 05/25/2018 by Last Menstrual Period presenting for a routine prenatal visit.  Plan   pregnancy Problems (from 09/29/17 to present)    Problem Noted Resolved   Anemia of pregnancy 04/07/2018 by Will Bonnet, MD No   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 12/21/2017 by Homero Fellers, MD No   Overview Addendum 01/26/2018  9:21 AM by Rexene Agent, CNM    Labetalol 200mg  BID Daily 81 mg aspirin  [x ] Aspirin 81 mg daily after 12 weeks; discontinue after 36 weeks [x ] baseline labs with CBC, CMP, urine protein/creatinine ratio [ ]  ultrasound for growth at 28, 32, 36 weeks [ ]  Baseline EKG   Current antihypertensives:  Labetalol   Baseline and surveillance labs (pulled in from [facility], refresh links as needed)  Lab Results  Component Value Date   PLT 270 10/18/2017   CREATININE 0.50 10/18/2017   AST 17 10/18/2017   ALT 15 10/18/2017    Antenatal Testing CHTN - O10.919  Group I  BP < 140/90, no preeclampsia, AGA,  nml AFV, +/- meds    Group II BP > 140/90, on meds, no preeclampsia, AGA, nml AFV  20-28-34-38  20-24-28-32-35-38  32//2 x wk  28//BPP wkly then 32//2 x wk  40 no meds; 39 meds  PRN or 37  Pre-eclampsia  GHTN - O13.9/Preeclampsia without severe features  - O14.00   Preeclampsia with severe features - O14.10  Q 3-4wks  Q 2 wks  28//BPP wkly then 32//2 x wk  Inpatient  36  PRN or 34        History of cesarean delivery 10/06/2017 by Will Bonnet, MD No   Advanced maternal age in multigravida 10/06/2017 by Will Bonnet, MD No   Supervision of high risk  pregnancy, antepartum 09/29/2017 by Rod Can, CNM No   Overview Addendum 02/23/2018 10:55 AM by Dalia Heading, [place] Prenatal Labs  Dating  LMP=6week [place] Blood type: A/Positive/-- (03/27 1115)   Genetic Screen NIPS: collected 11/03/17 Antibody:Negative (03/27 1115)  Anatomic [place] Complete 01/26/2018 Rubella: 4.84 (03/27 1115) Varicella: Immune  GTT Early: 91               Third trimester:  RPR: Non Reactive (03/27 1115)   Rhogam  Not needed HBsAg: Negative (03/27 1115)   TDaP vaccine   Flu Shot: declined HIV: Non Reactive (03/27 1115)   Baby Food         Breast                       GBS:   Contraception  Pap:NIL 12/01/2017  CBB     CS/VBAC With G4/VBAC G5   Support Person Michael            Reactive NST, AFI 10.3 cm    Discussion of induction of labor process, risks/benefits; she wishes to proceed with an IOL after this conversation.  Term labor symptoms and general obstetric precautions were reviewed.  Please refer to After Visit Summary for other counseling recommendations.   Will schedule induction of labor and notify patient of time.  Avel Sensor, CNM 05/18/2018

## 2018-05-18 NOTE — Progress Notes (Signed)
ROB Ultrasound AFI/NST

## 2018-05-24 ENCOUNTER — Inpatient Hospital Stay: Payer: 59 | Admitting: Anesthesiology

## 2018-05-24 ENCOUNTER — Other Ambulatory Visit: Payer: Self-pay

## 2018-05-24 ENCOUNTER — Inpatient Hospital Stay
Admission: EM | Admit: 2018-05-24 | Discharge: 2018-05-26 | DRG: 798 | Disposition: A | Discharge status: Home / Self Care | Payer: 59 | Attending: Obstetrics & Gynecology | Admitting: Obstetrics & Gynecology

## 2018-05-24 DIAGNOSIS — Z3A39 39 weeks gestation of pregnancy: Secondary | ICD-10-CM | POA: Diagnosis not present

## 2018-05-24 DIAGNOSIS — O1002 Pre-existing essential hypertension complicating childbirth: Secondary | ICD-10-CM | POA: Diagnosis present

## 2018-05-24 DIAGNOSIS — O10913 Unspecified pre-existing hypertension complicating pregnancy, third trimester: Secondary | ICD-10-CM | POA: Diagnosis present

## 2018-05-24 DIAGNOSIS — O09529 Supervision of elderly multigravida, unspecified trimester: Secondary | ICD-10-CM

## 2018-05-24 DIAGNOSIS — O34219 Maternal care for unspecified type scar from previous cesarean delivery: Secondary | ICD-10-CM | POA: Diagnosis present

## 2018-05-24 DIAGNOSIS — Z302 Encounter for sterilization: Secondary | ICD-10-CM | POA: Diagnosis not present

## 2018-05-24 DIAGNOSIS — D649 Anemia, unspecified: Secondary | ICD-10-CM | POA: Diagnosis present

## 2018-05-24 DIAGNOSIS — Z87891 Personal history of nicotine dependence: Secondary | ICD-10-CM

## 2018-05-24 DIAGNOSIS — O9902 Anemia complicating childbirth: Secondary | ICD-10-CM | POA: Diagnosis present

## 2018-05-24 DIAGNOSIS — Z98891 History of uterine scar from previous surgery: Secondary | ICD-10-CM

## 2018-05-24 DIAGNOSIS — O1092 Unspecified pre-existing hypertension complicating childbirth: Secondary | ICD-10-CM

## 2018-05-24 DIAGNOSIS — Z3490 Encounter for supervision of normal pregnancy, unspecified, unspecified trimester: Secondary | ICD-10-CM | POA: Diagnosis present

## 2018-05-24 LAB — TYPE AND SCREEN
ABO/RH(D): A POS
ANTIBODY SCREEN: NEGATIVE

## 2018-05-24 LAB — CBC
HEMATOCRIT: 33.6 % — AB (ref 36.0–46.0)
HEMOGLOBIN: 11.1 g/dL — AB (ref 12.0–15.0)
MCH: 25.3 pg — ABNORMAL LOW (ref 26.0–34.0)
MCHC: 33 g/dL (ref 30.0–36.0)
MCV: 76.7 fL — ABNORMAL LOW (ref 80.0–100.0)
NRBC: 0 % (ref 0.0–0.2)
Platelets: 198 10*3/uL (ref 150–400)
RBC: 4.38 MIL/uL (ref 3.87–5.11)
RDW: 18.1 % — AB (ref 11.5–15.5)
WBC: 11.1 10*3/uL — AB (ref 4.0–10.5)

## 2018-05-24 MED ORDER — EPHEDRINE 5 MG/ML INJ: 10.0000 mg | INTRAVENOUS | Status: DC | PRN

## 2018-05-24 MED ORDER — LACTATED RINGERS IV SOLN
500.0000 mL | INTRAVENOUS | Status: DC | PRN
  Administered 2018-05-24: 500 mL via INTRAVENOUS

## 2018-05-24 MED ORDER — FENTANYL 2.5 MCG/ML W/ROPIVACAINE 0.15% IN NS 100 ML EPIDURAL (ARMC)
12.0000 mL/h | EPIDURAL | Status: DC
  Administered 2018-05-24: 12 mL/h via EPIDURAL
  Filled 2018-05-24: qty 100

## 2018-05-24 MED ORDER — HYDRALAZINE HCL 20 MG/ML IJ SOLN: 10.0000 mg | INTRAMUSCULAR | Status: DC | PRN

## 2018-05-24 MED ORDER — BENZOCAINE-MENTHOL 20-0.5 % EX AERO: 1.0000 "application " | INHALATION_SPRAY | CUTANEOUS | Status: DC | PRN

## 2018-05-24 MED ORDER — LABETALOL HCL 5 MG/ML IV SOLN
20.0000 mg | INTRAVENOUS | Status: DC | PRN
  Filled 2018-05-24: qty 4

## 2018-05-24 MED ORDER — OXYTOCIN BOLUS FROM INFUSION
500.0000 mL | Freq: Once | INTRAVENOUS | Status: AC
  Administered 2018-05-24: 500 mL via INTRAVENOUS

## 2018-05-24 MED ORDER — COCONUT OIL OIL
1.0000 "application " | TOPICAL_OIL | Status: DC | PRN
  Administered 2018-05-25: 1 via TOPICAL
  Filled 2018-05-24: qty 120

## 2018-05-24 MED ORDER — OXYTOCIN 40 UNITS IN LACTATED RINGERS INFUSION - SIMPLE MED
2.5000 [IU]/h | INTRAVENOUS | Status: DC
  Administered 2018-05-24: 2.5 [IU]/h via INTRAVENOUS

## 2018-05-24 MED ORDER — DIPHENHYDRAMINE HCL 25 MG PO CAPS: 25.0000 mg | ORAL_CAPSULE | Freq: Four times a day (QID) | ORAL | Status: DC | PRN

## 2018-05-24 MED ORDER — DIBUCAINE 1 % RE OINT: 1.0000 "application " | TOPICAL_OINTMENT | RECTAL | Status: DC | PRN

## 2018-05-24 MED ORDER — WITCH HAZEL-GLYCERIN EX PADS: 1.0000 "application " | MEDICATED_PAD | CUTANEOUS | Status: DC | PRN

## 2018-05-24 MED ORDER — ZOLPIDEM TARTRATE 5 MG PO TABS: 5.0000 mg | ORAL_TABLET | Freq: Every evening | ORAL | Status: DC | PRN

## 2018-05-24 MED ORDER — ACETAMINOPHEN 325 MG PO TABS: 650.0000 mg | ORAL_TABLET | ORAL | Status: DC | PRN

## 2018-05-24 MED ORDER — LIDOCAINE HCL (PF) 1 % IJ SOLN
INTRAMUSCULAR | Status: AC
  Filled 2018-05-24: qty 30

## 2018-05-24 MED ORDER — OXYTOCIN 10 UNIT/ML IJ SOLN
INTRAMUSCULAR | Status: AC
  Filled 2018-05-24: qty 2

## 2018-05-24 MED ORDER — ONDANSETRON HCL 4 MG/2ML IJ SOLN
4.0000 mg | Freq: Four times a day (QID) | INTRAMUSCULAR | Status: DC | PRN
Start: 2018-05-24 — End: 2018-05-24

## 2018-05-24 MED ORDER — BUTORPHANOL TARTRATE 2 MG/ML IJ SOLN
INTRAMUSCULAR | Status: AC
  Filled 2018-05-24: qty 1

## 2018-05-24 MED ORDER — MISOPROSTOL 200 MCG PO TABS
ORAL_TABLET | ORAL | Status: AC
  Filled 2018-05-24: qty 4

## 2018-05-24 MED ORDER — OXYTOCIN 40 UNITS IN LACTATED RINGERS INFUSION - SIMPLE MED
INTRAVENOUS | Status: AC
  Filled 2018-05-24: qty 1000

## 2018-05-24 MED ORDER — LIDOCAINE-EPINEPHRINE (PF) 1.5 %-1:200000 IJ SOLN
INTRAMUSCULAR | Status: DC | PRN
  Administered 2018-05-24: 3 mL via EPIDURAL

## 2018-05-24 MED ORDER — AMMONIA AROMATIC IN INHA
RESPIRATORY_TRACT | Status: AC
  Filled 2018-05-24: qty 10

## 2018-05-24 MED ORDER — DIPHENHYDRAMINE HCL 50 MG/ML IJ SOLN: 12.5000 mg | INTRAMUSCULAR | Status: DC | PRN

## 2018-05-24 MED ORDER — LABETALOL HCL 5 MG/ML IV SOLN
80.0000 mg | INTRAVENOUS | Status: DC | PRN
  Filled 2018-05-24: qty 16

## 2018-05-24 MED ORDER — FENTANYL CITRATE (PF) 100 MCG/2ML IJ SOLN: 50.0000 ug | INTRAMUSCULAR | Status: DC | PRN

## 2018-05-24 MED ORDER — LABETALOL HCL 5 MG/ML IV SOLN
10.0000 mg | Freq: Once | INTRAVENOUS | Status: AC
  Administered 2018-05-24: 10 mg via INTRAVENOUS

## 2018-05-24 MED ORDER — SODIUM CHLORIDE 0.9 % IV SOLN: 250.0000 mL | INTRAVENOUS | Status: DC | PRN

## 2018-05-24 MED ORDER — SODIUM CHLORIDE 0.9 % IV SOLN
INTRAVENOUS | Status: DC | PRN
  Administered 2018-05-24 (×2): 5 mL via EPIDURAL

## 2018-05-24 MED ORDER — OXYCODONE-ACETAMINOPHEN 5-325 MG PO TABS
1.0000 | ORAL_TABLET | ORAL | Status: DC | PRN
  Administered 2018-05-25 (×2): 1 via ORAL
  Filled 2018-05-24 (×2): qty 1

## 2018-05-24 MED ORDER — LACTATED RINGERS IV SOLN
500.0000 mL | Freq: Once | INTRAVENOUS | Status: AC
  Administered 2018-05-24: 500 mL via INTRAVENOUS

## 2018-05-24 MED ORDER — LACTATED RINGERS IV SOLN
INTRAVENOUS | Status: DC
  Administered 2018-05-24 (×3): via INTRAVENOUS

## 2018-05-24 MED ORDER — ONDANSETRON HCL 4 MG PO TABS: 4.0000 mg | ORAL_TABLET | ORAL | Status: DC | PRN

## 2018-05-24 MED ORDER — LIDOCAINE HCL (PF) 1 % IJ SOLN
INTRAMUSCULAR | Status: DC | PRN
  Administered 2018-05-24: 2 mL via SUBCUTANEOUS

## 2018-05-24 MED ORDER — TERBUTALINE SULFATE 1 MG/ML IJ SOLN: 0.2500 mg | Freq: Once | INTRAMUSCULAR | Status: DC | PRN

## 2018-05-24 MED ORDER — SODIUM CHLORIDE 0.9% FLUSH: 3.0000 mL | INTRAVENOUS | Status: DC | PRN

## 2018-05-24 MED ORDER — SENNOSIDES-DOCUSATE SODIUM 8.6-50 MG PO TABS
2.0000 | ORAL_TABLET | ORAL | Status: DC
  Administered 2018-05-25: 2 via ORAL
  Filled 2018-05-24 (×2): qty 2

## 2018-05-24 MED ORDER — ONDANSETRON HCL 4 MG/2ML IJ SOLN: 4.0000 mg | INTRAMUSCULAR | Status: DC | PRN

## 2018-05-24 MED ORDER — OXYCODONE-ACETAMINOPHEN 5-325 MG PO TABS
2.0000 | ORAL_TABLET | ORAL | Status: DC | PRN
  Administered 2018-05-25: 2 via ORAL
  Filled 2018-05-24: qty 2

## 2018-05-24 MED ORDER — BUTORPHANOL TARTRATE 2 MG/ML IJ SOLN
2.0000 mg | Freq: Once | INTRAMUSCULAR | Status: AC
  Administered 2018-05-24: 2 mg via INTRAVENOUS

## 2018-05-24 MED ORDER — SODIUM CHLORIDE 0.9% FLUSH: 3.0000 mL | Freq: Two times a day (BID) | INTRAVENOUS | Status: DC

## 2018-05-24 MED ORDER — OXYTOCIN 40 UNITS IN LACTATED RINGERS INFUSION - SIMPLE MED
1.0000 m[IU]/min | INTRAVENOUS | Status: DC
  Administered 2018-05-24: 2 m[IU]/min via INTRAVENOUS
  Filled 2018-05-24: qty 1000

## 2018-05-24 MED ORDER — LABETALOL HCL 5 MG/ML IV SOLN
INTRAVENOUS | Status: AC
  Filled 2018-05-24: qty 4

## 2018-05-24 MED ORDER — LABETALOL HCL 5 MG/ML IV SOLN
40.0000 mg | INTRAVENOUS | Status: DC | PRN
  Filled 2018-05-24: qty 8

## 2018-05-24 MED ORDER — SIMETHICONE 80 MG PO CHEW: 80.0000 mg | CHEWABLE_TABLET | ORAL | Status: DC | PRN

## 2018-05-24 MED ORDER — PHENYLEPHRINE 40 MCG/ML (10ML) SYRINGE FOR IV PUSH (FOR BLOOD PRESSURE SUPPORT): 80.0000 ug | PREFILLED_SYRINGE | INTRAVENOUS | Status: DC | PRN

## 2018-05-24 NOTE — Progress Notes (Signed)
TOLAC Protocol initiated for Danielle Romero, Danielle Romero at 0700.  Calls made to: -Dr. Amie Critchley, Anesthesia -OR Charge RN, Beverlee Nims -A/C Lelon Frohlich

## 2018-05-24 NOTE — Anesthesia Procedure Notes (Signed)
Epidural Patient location during procedure: OB Start time: 05/24/2018 7:43 PM End time: 05/24/2018 7:52 PM  Staffing Anesthesiologist: Martha Clan, MD Performed: anesthesiologist   Preanesthetic Checklist Completed: patient identified, site marked, surgical consent, pre-op evaluation, timeout performed, IV checked, risks and benefits discussed and monitors and equipment checked  Epidural Patient position: sitting Prep: ChloraPrep Patient monitoring: heart rate, continuous pulse ox and blood pressure Approach: midline Location: L3-L4 Injection technique: LOR saline  Needle:  Needle type: Tuohy  Needle gauge: 17 G Needle length: 9 cm and 9 Needle insertion depth: 5 cm Catheter type: closed end flexible Catheter size: 19 Gauge Catheter at skin depth: 10 cm Test dose: negative and 1.5% lidocaine with Epi 1:200 K  Assessment Sensory level: T10 Events: blood not aspirated, injection not painful, no injection resistance, negative IV test and no paresthesia  Additional Notes 1st attempt Pt. Evaluated and documentation done after procedure finished. Patient identified. Risks/Benefits/Options discussed with patient including but not limited to bleeding, infection, nerve damage, paralysis, failed block, incomplete pain control, headache, blood pressure changes, nausea, vomiting, reactions to medication both or allergic, itching and postpartum back pain. Confirmed with bedside nurse the patient's most recent platelet count. Confirmed with patient that they are not currently taking any anticoagulation, have any bleeding history or any family history of bleeding disorders. Patient expressed understanding and wished to proceed. All questions were answered. Sterile technique was used throughout the entire procedure. Please see nursing notes for vital signs. Test dose was given through epidural catheter and negative prior to continuing to dose epidural or start infusion. Warning signs of high  block given to the patient including shortness of breath, tingling/numbness in hands, complete motor block, or any concerning symptoms with instructions to call for help. Patient was given instructions on fall risk and not to get out of bed. All questions and concerns addressed with instructions to call with any issues or inadequate analgesia.   Patient tolerated the insertion well without immediate complications.Reason for block:procedure for pain

## 2018-05-24 NOTE — Anesthesia Preprocedure Evaluation (Signed)
Anesthesia Evaluation  Patient identified by MRN, date of birth, ID band Patient awake    Reviewed: Allergy & Precautions, H&P , NPO status , Patient's Chart, lab work & pertinent test results, reviewed documented beta blocker date and time   History of Anesthesia Complications Negative for: history of anesthetic complications  Airway Mallampati: I  TM Distance: >3 FB Neck ROM: full    Dental  (+) Teeth Intact, Missing   Pulmonary neg shortness of breath, asthma (as a child) , neg COPD, neg recent URI, Current Smoker,           Cardiovascular Exercise Tolerance: Good hypertension, (-) angina(-) dysrhythmias (-) Valvular Problems/Murmurs     Neuro/Psych negative neurological ROS  negative psych ROS   GI/Hepatic Neg liver ROS, GERD  ,  Endo/Other  negative endocrine ROS  Renal/GU negative Renal ROS  negative genitourinary   Musculoskeletal   Abdominal   Peds  Hematology  (+) Blood dyscrasia, anemia ,   Anesthesia Other Findings Past Medical History: No date: Breast mass No date: Bronchitis No date: GERD (gastroesophageal reflux disease)     Comment:  NO MEDS No date: Obesity (BMI 30-39.9)   Reproductive/Obstetrics negative OB ROS                             Anesthesia Physical Anesthesia Plan  ASA: III  Anesthesia Plan: Epidural   Post-op Pain Management:    Induction:   PONV Risk Score and Plan:   Airway Management Planned:   Additional Equipment:   Intra-op Plan:   Post-operative Plan:   Informed Consent: I have reviewed the patients History and Physical, chart, labs and discussed the procedure including the risks, benefits and alternatives for the proposed anesthesia with the patient or authorized representative who has indicated his/her understanding and acceptance.   Dental Advisory Given  Plan Discussed with: Anesthesiologist, CRNA and Surgeon  Anesthesia Plan  Comments:         Anesthesia Quick Evaluation

## 2018-05-24 NOTE — H&P (Signed)
Obstetrics Admission History & Physical   Encounter for planned induction of labor   HPI:  37 y.o. [id number] @ [redacted]w[redacted]d (05/25/2018, by Last Menstrual Period). Admitted on 05/24/2018:   Patient Active Problem List   Diagnosis Date Noted  . Encounter for planned induction of labor 05/24/2018  . Anemia of pregnancy 04/07/2018  . Heartburn during pregnancy in second trimester 01/26/2018  . 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 History of cesarean delivery 10/06/2017  . Advanced maternal age in multigravida 10/06/2017  . Supervision of high risk pregnancy, antepartum 09/29/2017  . Breast mass, left   . Abnormal mammogram of left breast      Presents for planned induction of labor at term. She has been having some intermittent contractions. She denies pain, vaginal bleeding, and loss of fluid. She has no headache or epigastric pain. Baby is moving well.  Prenatal care at: at [facility]. Pregnancy complicated by chronic HTN, tobacco use, anemia.  ROS: A review of systems was performed and negative, except as stated in the above HPI. PMHx:  Past Medical History:  Diagnosis Date  . Breast mass   . Bronchitis   . GERD (gastroesophageal reflux disease)    NO MEDS  . Obesity (BMI 30-39.9)    PSHx:  Past Surgical History:  Procedure Laterality Date  . BREAST BIOPSY Left 08/13/2016   Procedure: left BREAST BIOPSY WITH NEEDLE LOCALIZATION left Moeller's gland excision;  Surgeon: Florene Glen, MD;  Location: [facility] ORS;  Service: General;  Laterality: Left;  . CESAREAN SECTION  2007   Medications:  Medications Prior to Admission  Medication Sig Dispense Refill Last Dose  . aspirin EC 81 MG tablet Take 1 tablet (81 mg total) by mouth daily. Take after 12 weeks for prevention of preeclampssia later in pregnancy 300 tablet 2 Taking  . nitrofurantoin, macrocrystal-monohydrate, (MACROBID) 100 MG capsule Take 1 capsule (100 mg total) by mouth 2 (two) times daily. 14 capsule  1   . Prenat-FeFmCb-DSS-FA-DHA w/o A (CITRANATAL HARMONY) 27-1-260 MG CAPS Take 1 capsule by mouth daily. 30 capsule 7 Taking   Allergies: has No Known Allergies. OBHx:  OB History  Gravida Para Term Preterm AB Living  6 4 4   1 4   SAB TAB Ectopic Multiple Live Births          4    # Outcome Date GA Lbr Len/2nd Weight Sex Delivery Anes PTL Lv  6 Current           5 Term 02/20/09 [redacted]w[redacted]d  3771 g M Vag-Spont   LIV  4 Term 03/19/06 [redacted]w[redacted]d  3884 g F CS-LTranv   LIV     Complications: Fetal Intolerance, True knot in cord  3 Term 08/15/01 [redacted]w[redacted]d  3941 g F Vag-Spont   LIV  2 Term 01/02/99 [redacted]w[redacted]d  4026 g F Vag-Spont  N LIV  1 AB            ELF:YBOFBPZWCHEN, breast cancer, and kidney disease in her mother.  No family history of birth defects. Soc Hx: Current smoker, Alcohol: none, Recreational drug use: none and Pregnancy welcomed  Objective:  There were no vitals filed for this visit. Constitutional: Well nourished, well developed female in no acute distress.  HEENT: normal Skin: Warm and dry.  Cardiovascular:Regular rate and rhythm.   Extremity: trace to 1+ bilateral pedal edema Respiratory: Clear to auscultation bilaterally. Normal respiratory effort Abdomen: gravid, non-tender Neuro: Cranial nerves grossly intact Psych: Alert and Oriented x3. No memory  deficits. Normal mood and affect.  MS: normal gait, normal bilateral lower extremity ROM/strength/stability.  Pelvic exam: is not limited by body habitus External Genitalia, Bartholin's glands, Urethra, Skene's glands: within normal limits Vagina: within normal limits and with normal mucosa, no blood in the vault Cervix: 3/60/-3 Uterus: Spontaneous uterine activity   EFM:FHR: 140 bpm, variability: moderate,  accelerations:  Present,  decelerations:  Absent Toco: Irregular contractions, mild   Perinatal info:  Blood type: A positive Rubella - Immune Varicella - Immune TDaP Given during third trimester of this pregnancy on  03/23/2018 RPR NR / HIV Neg/ HBsAg Neg   Assessment & Plan:   37 y.o. [id number] @ [redacted]w[redacted]d, Admitted on 05/24/2018 for planned induction of labor with cHTN, Carlos for labor, Observe for cervical change, Fetal Wellbeing Reassuring, Epidural when ready, AROM when Appropriate and GBS status negative, treat as needed  Start Pitocin. Risks and benefits reviewed, and patient agrees to proceed.  Avel Sensor, CNM [facility], [facility] 05/24/2018  6:21 AM

## 2018-05-24 NOTE — Discharge Summary (Signed)
OB Discharge Summary     Patient Name: Danielle Romero DOB: 1981-02-01 MRN: [id number]  Date of admission: 05/24/2018 Delivering MD: Hoyt Koch, MD  Date of Delivery: 05/24/2018  Date of discharge: 05/26/2018  Admitting diagnosis: 39 wks Induction Intrauterine pregnancy: [redacted]w[redacted]d     Secondary diagnosis: Prior cesarean, history of chronic hypertension     Discharge diagnosis: Term Pregnancy Delivered, Sterility                         Hospital course:  Induction of Labor With Vaginal Delivery   37 y.o. yo [id number] at [redacted]w[redacted]d was admitted to the hospital 05/24/2018 for induction of labor.  Indication for induction: Favorable cervix at term and Gestational hypertension.  Patient had an uncomplicated labor course as follows: Membrane Rupture Time/Date: 6:58 PM ,05/24/2018   Intrapartum Procedures: Episiotomy: None [1]                                         Lacerations:  None [1]  Patient had delivery of a Viable infant.  Information for the patient's newborn:  Surie, Suchocki [[id number]]  Delivery Method: Vag-Spont   05/24/2018  Details of delivery can be found in separate delivery note.  Patient had a routine postpartum course. Patient is discharged home 05/26/18.                                                                 Post partum procedures:postpartum tubal ligation  Complications: None  Physical exam on 05/26/2018: Vitals:   05/25/18 2020 05/26/18 0003 05/26/18 0344 05/26/18 0751  BP: 107/63 132/70 124/68 129/69  Pulse: 68 70 72 77  Resp: 18 20 20 18   Temp: 98.1 F (36.7 C) 98.3 F (36.8 C) 97.9 F (36.6 C) 98.4 F (36.9 C)  TempSrc: Oral Oral Oral Oral  SpO2: 98% 97% 99% 98%  Weight:      Height:       General: alert, cooperative and no distress Lochia: appropriate Uterine Fundus: firm Incision: Healing well with no significant drainage DVT Evaluation: No evidence of DVT seen on physical exam.  Labs: Lab Results  Component Value Date   WBC  15.4 (H) 05/25/2018   HGB 10.6 (L) 05/25/2018   HCT 32.8 (L) 05/25/2018   MCV 78.5 (L) 05/25/2018   PLT 207 05/25/2018   CMP Latest Ref Rng & Units 04/19/2018  Glucose 70 - 99 mg/dL 89  BUN 6 - 20 mg/dL 7  Creatinine 0.44 - 1.00 mg/dL 0.59  Sodium 135 - 145 mmol/L 138  Potassium 3.5 - 5.1 mmol/L 3.6  Chloride 98 - 111 mmol/L 110  CO2 22 - 32 mmol/L 21(L)  Calcium 8.9 - 10.3 mg/dL 9.2  Total Protein 6.5 - 8.1 g/dL 6.6  Total Bilirubin 0.3 - 1.2 mg/dL 0.3  Alkaline Phos 38 - 126 U/L 176(H)  AST 15 - 41 U/L 13(L)  ALT 0 - 44 U/L 9    Discharge instruction: per After Visit Summary.  Medications:  Allergies as of 05/26/2018   No Known Allergies     Medication List    STOP taking these medications  aspirin EC 81 MG tablet   nitrofurantoin (macrocrystal-monohydrate) 100 MG capsule Commonly known as:  MACROBID     TAKE these medications   CITRANATAL HARMONY 27-1-260 MG Caps Take 1 capsule by mouth daily.   oxyCODONE-acetaminophen 5-325 MG tablet Commonly known as:  PERCOCET/ROXICET Take 1 tablet by mouth every 6 (six) hours as needed.       Diet: routine diet  Activity: Advance as tolerated. Pelvic rest for 6 weeks.   Outpatient follow up: Follow-up Information    Gae Dry, MD. Schedule an appointment as soon as possible for a visit in 6 week(s).   Specialty:  Obstetrics and Gynecology Why:  Please call to schedule your 6 week postpartum follow up appointment with Dr. Peggye Ley information: [address] [place] [address] [phone]             Postpartum contraception: Tubal Ligation Rhogam Given postpartum: no Rubella vaccine given postpartum: no Varicella vaccine given postpartum: no TDaP given antepartum or postpartum: Yes  Newborn Data: Live born female  Birth Weight: 8 lb, 5.7 oz APGAR: 9, 9  Newborn Delivery   Birth date/time:  05/24/2018 21:01:00 Delivery type:  VBAC, Spontaneous      Baby Feeding:  Breast  Disposition: home with mother  SIGNED: Rexene Agent, CNM 05/26/2018 2:43 PM

## 2018-05-24 NOTE — Progress Notes (Signed)
  Labor Progress Note   37 y.o. [id number] @ [redacted]w[redacted]d , admitted for  Pregnancy, Labor Management.   Subjective:  Pain worse, desires epidural  Objective:  BP 129/71 (BP Location: Right Arm)   Pulse 73   Temp 98.3 F (36.8 C) (Oral)   Resp 16   Ht 5\' 7"  (1.702 m)   Wt 108 kg   LMP 08/18/2017   BMI 37.28 kg/m  Abd: gravid ND tender w ctxs Extr: trace to 1+ bilateral pedal edema SVE: CERVIX: 6 cm dilated, 80 effaced, -3 station  EFM: FHR: 140 bpm, variability: moderate,  accelerations:  Present,  decelerations:  Absent now, she did have 2 decels after IV sedation given earlier, w good recovery Toco: Frequency: Every 3 minutes Labs: I have reviewed the patient's lab results.  Assessment & Plan:  [id number] @ [redacted]w[redacted]d, admitted for  Pregnancy and Labor/Delivery Management  1. Pain management: IV sedation. 2. FWB: FHT category 2.  3. ID: GBS negative 4. Labor management: Progressing well and quickly now Epidural per pt request Expect second stage soon Monitor fetal wellbeing  All discussed with patient, see orders  Barnett Applebaum, MD, [facility], [facility] 05/24/2018  7:06 PM

## 2018-05-24 NOTE — H&P (Signed)
History and Physical Interval Note:  05/24/2018 7:08 AM  Danielle Romero  has presented today for INDUCTION OF LABOR (pitocin),  with the diagnosis of Favorable cervix at term and chronic HTN (well controlled); patient has prior cesarean but has had 2 vaginal deliveries before that and one after that CS.Marland Kitchen The various methods of treatment have been discussed with the patient and family. After consideration of risks, benefits and other options for treatment, the patient has consented to  Labor induction .  The patient's history has been reviewed, patient examined, no change in status, and is stable for induction as planned.  See H&P. I have reviewed the patient's chart and labs.  Questions were answered to the patient's satisfaction.    Barnett Applebaum, MD, [facility], [facility] 05/24/2018  7:08 AM

## 2018-05-24 NOTE — Progress Notes (Signed)
  Labor Progress Note   37 y.o. [id number] @ [redacted]w[redacted]d , admitted for  Pregnancy, Labor Management. TOLAC/induction  Subjective:  Patient feels contractions of varying quality, some stronger than others  Objective:  BP 131/88 (BP Location: Right Arm)   Pulse 80   Temp 98.2 F (36.8 C) (Oral)   Resp 16   Ht 5\' 7"  (1.702 m)   Wt 108 kg   LMP 08/18/2017   BMI 37.28 kg/m  Abd: mild Extr: trace to 1+ bilateral pedal edema SVE: CERVIX: 3.5 cm dilated, 60 effaced, -2 station   EFM: FHR: 140 bpm, variability: moderate,  accelerations:  Present,  decelerations: a few subtle late decelerations. There was a period of minimal variability and then return to moderate.  Toco: Frequency: Every 2-4 minutes Labs: I have reviewed the patient's lab results.   Assessment & Plan:  [id number] @ [redacted]w[redacted]d, admitted for  Pregnancy and Labor/Delivery Management  1. Pain management: none.  2. FWB: FHT category II, overall reassuring.  3. ID: GBS negative 4. Labor management: cervical sweep, position changes, continue pitocin  All discussed with patient, see orders   Rod Can, CNM [facility], [facility] 05/24/2018  1:14 PM

## 2018-05-24 NOTE — Progress Notes (Signed)
  Labor Progress Note   37 y.o. [id number] @ [redacted]w[redacted]d , admitted for  Pregnancy, Labor Management.   Subjective:  Pain at times w ctxs.  Objective:  BP 131/88 (BP Location: Right Arm)   Pulse 80   Temp 98.2 F (36.8 C) (Oral)   Resp 16   Ht 5\' 7"  (1.702 m)   Wt 108 kg   LMP 08/18/2017   BMI 37.28 kg/m  Abd: gravid ND NT Extr: trace to 1+ bilateral pedal edema SVE: CERVIX: 3 cm ext os but <1 cm int os and difficult to reach, 30%, -3  EFM: FHR: 140 bpm, variability: moderate,  accelerations:  Present,  decelerations:  Absent    She has had a few episodes of late decelerations that improved w position changes Toco: Frequency: Every 3-5 minutes on Pitocin 14 mU/min   Assessment & Plan:  [id number] @ [redacted]w[redacted]d, admitted for  Pregnancy and Labor/Delivery Management  1. Pain management: none. 2. FWB: FHT category 1.  3. ID: GBS negative 4. Labor management: Options discussed.  Still no cervical change in in latent labor.  No time line but would like to see cervical change. Pt is having IOL due to AMA, cHTN, and >39 weeks.  It is not reasonable to delay delivery any further. Pt cannot have Cytotec due to prior CS (12 years ago) Foley bulb not a viable option based on my cervical exam now. Plan continued attempt w Pitocin to get into active labor.  CS option discussed as well.   (would want BTL as well).  All discussed with patient, see orders  Barnett Applebaum, MD, [facility], [facility] 05/24/2018  3:55 PM

## 2018-05-25 ENCOUNTER — Inpatient Hospital Stay: Payer: 59 | Admitting: Anesthesiology

## 2018-05-25 ENCOUNTER — Encounter: Payer: Self-pay | Admitting: *Deleted

## 2018-05-25 ENCOUNTER — Encounter
Admission: EM | Disposition: A | Discharge status: Home / Self Care | Payer: Self-pay | Source: Home / Self Care | Attending: Obstetrics & Gynecology

## 2018-05-25 ENCOUNTER — Inpatient Hospital Stay: Admission: AD | Admit: 2018-05-25 | Payer: 59 | Source: Home / Self Care

## 2018-05-25 DIAGNOSIS — Z302 Encounter for sterilization: Secondary | ICD-10-CM

## 2018-05-25 HISTORY — PX: TUBAL LIGATION: SHX77

## 2018-05-25 LAB — CBC
HCT: 32.8 % — ABNORMAL LOW (ref 36.0–46.0)
Hemoglobin: 10.6 g/dL — ABNORMAL LOW (ref 12.0–15.0)
MCH: 25.4 pg — AB (ref 26.0–34.0)
MCHC: 32.3 g/dL (ref 30.0–36.0)
MCV: 78.5 fL — AB (ref 80.0–100.0)
NRBC: 0 % (ref 0.0–0.2)
PLATELETS: 207 10*3/uL (ref 150–400)
RBC: 4.18 MIL/uL (ref 3.87–5.11)
RDW: 18.2 % — ABNORMAL HIGH (ref 11.5–15.5)
WBC: 15.4 10*3/uL — ABNORMAL HIGH (ref 4.0–10.5)

## 2018-05-25 SURGERY — LIGATION, FALLOPIAN TUBE, POSTPARTUM
Anesthesia: Spinal | Site: Abdomen | Laterality: Bilateral

## 2018-05-25 MED ORDER — BUPIVACAINE IN DEXTROSE 0.75-8.25 % IT SOLN
INTRATHECAL | Status: DC | PRN
  Administered 2018-05-25: 2 mL via INTRATHECAL

## 2018-05-25 MED ORDER — OXYCODONE HCL 5 MG PO TABS: 5.0000 mg | ORAL_TABLET | Freq: Once | ORAL | Status: DC | PRN

## 2018-05-25 MED ORDER — IBUPROFEN 600 MG PO TABS
600.0000 mg | ORAL_TABLET | Freq: Four times a day (QID) | ORAL | Status: DC
  Administered 2018-05-25 – 2018-05-26 (×5): 600 mg via ORAL
  Filled 2018-05-25 (×4): qty 1

## 2018-05-25 MED ORDER — BUPIVACAINE HCL 0.5 % IJ SOLN
INTRAMUSCULAR | Status: DC | PRN
  Administered 2018-05-25: 28 mL

## 2018-05-25 MED ORDER — CEFAZOLIN SODIUM-DEXTROSE 2-4 GM/100ML-% IV SOLN
INTRAVENOUS | Status: AC
  Filled 2018-05-25: qty 100

## 2018-05-25 MED ORDER — ONDANSETRON HCL 4 MG/2ML IJ SOLN: 4.0000 mg | Freq: Once | INTRAMUSCULAR | Status: DC | PRN

## 2018-05-25 MED ORDER — FENTANYL CITRATE (PF) 100 MCG/2ML IJ SOLN: 25.0000 ug | INTRAMUSCULAR | Status: DC | PRN

## 2018-05-25 MED ORDER — FENTANYL CITRATE (PF) 100 MCG/2ML IJ SOLN
INTRAMUSCULAR | Status: DC | PRN
  Administered 2018-05-25: 25 ug via INTRAVENOUS
  Administered 2018-05-25: 15 ug via INTRATHECAL
  Administered 2018-05-25 (×2): 25 ug via INTRAVENOUS
  Administered 2018-05-25: 10 ug via INTRAVENOUS

## 2018-05-25 MED ORDER — IBUPROFEN 600 MG PO TABS
ORAL_TABLET | ORAL | Status: AC
  Filled 2018-05-25: qty 1

## 2018-05-25 MED ORDER — CEFAZOLIN SODIUM-DEXTROSE 2-4 GM/100ML-% IV SOLN
2.0000 g | INTRAVENOUS | Status: AC
  Administered 2018-05-25: 2 g via INTRAVENOUS

## 2018-05-25 MED ORDER — LACTATED RINGERS IV SOLN
INTRAVENOUS | Status: DC
  Administered 2018-05-25 (×2): via INTRAVENOUS

## 2018-05-25 MED ORDER — MIDAZOLAM HCL 5 MG/5ML IJ SOLN
INTRAMUSCULAR | Status: DC | PRN
  Administered 2018-05-25: 2 mg via INTRAVENOUS

## 2018-05-25 MED ORDER — OXYCODONE HCL 5 MG/5ML PO SOLN: 5.0000 mg | Freq: Once | ORAL | Status: DC | PRN

## 2018-05-25 SURGICAL SUPPLY — 27 items
BLADE SURG SZ11 CARB STEEL (BLADE) ×3 IMPLANT
CHLORAPREP W/TINT 26ML (MISCELLANEOUS) ×3 IMPLANT
COVER LIGHT HANDLE STERIS (MISCELLANEOUS) ×3 IMPLANT
DERMABOND ADVANCED (GAUZE/BANDAGES/DRESSINGS) ×2
DERMABOND ADVANCED .7 DNX12 (GAUZE/BANDAGES/DRESSINGS) ×1 IMPLANT
DRAPE LAPAROTOMY 100X77 ABD (DRAPES) ×3 IMPLANT
ELECT CAUTERY BLADE 6.4 (BLADE) ×3 IMPLANT
ELECT REM PT RETURN 9FT ADLT (ELECTROSURGICAL) ×3
ELECTRODE REM PT RTRN 9FT ADLT (ELECTROSURGICAL) ×1 IMPLANT
GLOVE BIOGEL PI IND STRL 6.5 (GLOVE) ×1 IMPLANT
GLOVE BIOGEL PI INDICATOR 6.5 (GLOVE) ×2
GLOVE SURG SYN 6.5 ES PF (GLOVE) ×3 IMPLANT
GOWN STRL REUS W/ TWL LRG LVL3 (GOWN DISPOSABLE) ×2 IMPLANT
GOWN STRL REUS W/TWL LRG LVL3 (GOWN DISPOSABLE) ×4
LABEL OR SOLS (LABEL) ×3 IMPLANT
NEEDLE HYPO 22GX1.5 SAFETY (NEEDLE) ×3 IMPLANT
NS IRRIG 500ML POUR BTL (IV SOLUTION) ×3 IMPLANT
PACK BASIN MINOR ARMC (MISCELLANEOUS) ×3 IMPLANT
RETRACTOR RING XSMALL (MISCELLANEOUS) ×1 IMPLANT
RTRCTR WOUND ALEXIS 13CM XS SH (MISCELLANEOUS) ×3
SPONGE LAP 4X18 RFD (DISPOSABLE) ×3 IMPLANT
SUT CHROMIC 0 CT 1 (SUTURE) ×6 IMPLANT
SUT MNCRL 4-0 (SUTURE) ×2
SUT MNCRL 4-0 27XMFL (SUTURE) ×1
SUT VICRYL 0 AB UR-6 (SUTURE) ×3 IMPLANT
SUTURE MNCRL 4-0 27XMF (SUTURE) ×1 IMPLANT
SYR 10ML LL (SYRINGE) ×3 IMPLANT

## 2018-05-25 NOTE — Progress Notes (Signed)
Post Partum Day 1 (12 hours postpartum). VBAC last night at 2101. Hx of CHTN. Had severe range blood pressures last night surrounding delivery. Treated with IV labetalol x 1  Subjective: voiding and normal bleeding. NPO for postpartum tubal this AM. Breast feeding.   Objective: Blood pressure 129/65, pulse 78, temperature 98.2 F (36.8 C), temperature source Oral, resp. rate 18, height 5\' 7"  (1.702 m), weight 108 kg, last menstrual period 08/18/2017, SpO2 97 %, unknown if currently breastfeeding.  Physical Exam:  General: alert, cooperative and no distress, appears tired Lochia: appropriate Uterine Fundus: firm/ U/ML/NT  DVT Evaluation: No evidence of DVT seen on physical exam.  Recent Labs    05/24/18 0553 05/25/18 0514  HGB 11.1* 10.6*  HCT 33.6* 32.8*  WBC 11.1* 15.4*  PLT 198 207    Assessment/Plan: Stable PPD #1 Gestational hypertension: normotensive since one dose of IV labetalol last night Continue postpartum care/ monitor blood pressures IV LR at 125 ml/hr while NPO for surgery (BTL) A POS/ RI/ VI Breast/ Bottle Discussed how postpartum tubal is done. Dalia Heading, CNM      LOS: 1 day   Dalia Heading 05/25/2018, 9:57 AM

## 2018-05-25 NOTE — Anesthesia Post-op Follow-up Note (Signed)
Anesthesia QCDR form completed.        

## 2018-05-25 NOTE — Op Note (Signed)
Operative Note  05/25/2018  PRE-OP DIAGNOSIS: Desire for sterility  POST-OP DIAGNOSIS: same  SURGEON:Eldean Klatt MD  PROCEDURE: Postpartum Bilateral Tubal Ligation Procedure   ANESTHESIA: Spinal   ESTIMATED BLOOD LOSS: less than 10 cc  SPECIMENS: Portion of left and right tube  COMPLICATIONS: none  DISPOSITION: PACU - hemodynamically stable.  CONDITION: stable  FINDINGS: Exam under anesthesia revealed small, mobile 20 uterus with no masses and bilateral adnexa without masses or fullness.   TECHNIQUE:  Patient is prepped and draped in usual sterile fashion after adequate anesthesia is obtained in the supine position on the operating room table.  Local anesthesia is injected into the skin just inferior to the umbilicus, followed by a small elliptical incision with a scapel.  Fascia is identified and tented upwards, and an incision is made with the scalpel.  Identification of no adherent bowel is made. Retractors are placed and trendelenburg positioning is achieved.    The left Fallopian tube was identified, grasped with the Babcock clamps, lifted to the skin incision and followed out distally to the fimbriae. An avascular midsection of the tube approximately 3-4cm from the cornua was grasped with the babcock clamps and brought into a knuckle at the skin incision. The tube was double ligated with 0-0 Chromic suture and the intervening portion of tube was transected and removed. Excellent hemostasis was noted and the tube was returned to the abdomen. Attention was then turned to the right fallopian tube after confirmation of identification by tracing the tube out to the fimbriae. The same procedure was then performed on the right Fallopian tube. Again, excellent hemostasis was noted at the end of the procedure.  Retractors are removed and fascia closed with a 0-0 Vicryl suture. Irrigation and hemostasis confirmed.  Skin closed with a 4-0 monocryl suture in a subcuticular fashion  followed by skin adhesive.  Pt was taken to the recovery room in stable condition.  All counts correct times 2.   Adrian Prows MD [facility], [facility] 05/25/18 2:46 PM

## 2018-05-25 NOTE — Transfer of Care (Signed)
Immediate Anesthesia Transfer of Care Note  Patient: Danielle Romero  Procedure(s) Performed: POST PARTUM TUBAL LIGATION (Bilateral Abdomen)  Patient Location: PACU  Anesthesia Type:Spinal  Level of Consciousness: drowsy and patient cooperative  Airway & Oxygen Therapy: Patient Spontanous Breathing  Post-op Assessment: Report given to RN and Post -op Vital signs reviewed and stable  Post vital signs: Reviewed and stable  Last Vitals:  Vitals Value Taken Time  BP 104/53 05/25/2018  2:52 PM  Temp 36.5 C 05/25/2018  2:52 PM  Pulse 69 05/25/2018  2:53 PM  Resp 17 05/25/2018  2:53 PM  SpO2 95 % 05/25/2018  2:53 PM  Vitals shown include unvalidated device data.  Last Pain:  Vitals:   05/25/18 1320  TempSrc: Tympanic  PainSc: 4       Patients Stated Pain Goal: 0 (34/37/35 7897)  Complications: No apparent anesthesia complications

## 2018-05-25 NOTE — Anesthesia Procedure Notes (Signed)
Spinal  Patient location during procedure: OR Start time: 05/25/2018 1:50 PM End time: 05/25/2018 2:00 PM Staffing Anesthesiologist: Emmie Niemann, MD Performed: anesthesiologist  Preanesthetic Checklist Completed: patient identified, site marked, surgical consent, pre-op evaluation, timeout performed, IV checked, risks and benefits discussed and monitors and equipment checked Spinal Block Patient position: sitting Prep: ChloraPrep Patient monitoring: heart rate, continuous pulse ox, blood pressure and cardiac monitor Approach: midline Location: L4-5 Injection technique: single-shot Needle Needle type: Introducer and Pencil-Tip  Needle gauge: 24 G Needle length: 9 cm Assessment Sensory level: T4 Additional Notes Negative paresthesia. Negative blood return. Positive free-flowing CSF. Expiration date of kit checked and confirmed. Patient tolerated procedure well, without complications.

## 2018-05-25 NOTE — Anesthesia Preprocedure Evaluation (Signed)
Anesthesia Evaluation  Patient identified by MRN, date of birth, ID band Patient awake    Reviewed: Allergy & Precautions, NPO status , Patient's Chart, lab work & pertinent test results  History of Anesthesia Complications Negative for: history of anesthetic complications  Airway Mallampati: II  TM Distance: >3 FB Neck ROM: Full    Dental no notable dental hx.    Pulmonary neg sleep apnea, neg COPD, Current Smoker,    breath sounds clear to auscultation- rhonchi (-) wheezing      Cardiovascular Exercise Tolerance: Good hypertension (gHTN), (-) CAD, (-) Past MI, (-) Cardiac Stents and (-) CABG  Rhythm:Regular Rate:Normal - Systolic murmurs and - Diastolic murmurs    Neuro/Psych negative neurological ROS  negative psych ROS   GI/Hepatic Neg liver ROS, GERD  ,  Endo/Other  negative endocrine ROSneg diabetes  Renal/GU negative Renal ROS     Musculoskeletal negative musculoskeletal ROS (+)   Abdominal (+) + obese,   Peds  Hematology  (+) anemia ,   Anesthesia Other Findings Past Medical History: No date: Breast mass No date: Bronchitis No date: GERD (gastroesophageal reflux disease)     Comment:  NO MEDS No date: Obesity (BMI 30-39.9)   Reproductive/Obstetrics (+) Breast feeding                              Lab Results  Component Value Date   WBC 15.4 (H) 05/25/2018   HGB 10.6 (L) 05/25/2018   HCT 32.8 (L) 05/25/2018   MCV 78.5 (L) 05/25/2018   PLT 207 05/25/2018    Anesthesia Physical Anesthesia Plan  ASA: II  Anesthesia Plan: Spinal   Post-op Pain Management:    Induction:   PONV Risk Score and Plan: 1 and Ondansetron and Midazolam  Airway Management Planned: Natural Airway  Additional Equipment:   Intra-op Plan:   Post-operative Plan:   Informed Consent: I have reviewed the patients History and Physical, chart, labs and discussed the procedure including the  risks, benefits and alternatives for the proposed anesthesia with the patient or authorized representative who has indicated his/her understanding and acceptance.   Dental advisory given  Plan Discussed with: CRNA and Anesthesiologist  Anesthesia Plan Comments:         Anesthesia Quick Evaluation

## 2018-05-25 NOTE — Interval H&P Note (Signed)
History and Physical Interval Note:  05/25/2018 1:28 PM  Danielle Romero  has presented today for surgery, with the diagnosis of postpartum state, desires sterilization The various methods of treatment have been discussed with the patient and family. After consideration of risks, benefits and other options for treatment, the patient has consented to  Procedure(s): POST PARTUM TUBAL LIGATION (Bilateral) as a surgical intervention .  The patient's history has been reviewed, patient examined, no change in status, stable for surgery.  I have reviewed the patient's chart and labs.  Questions were answered to the patient's satisfaction.     [place]

## 2018-05-26 ENCOUNTER — Telehealth: Payer: Self-pay

## 2018-05-26 LAB — URINALYSIS, COMPLETE (UACMP) WITH MICROSCOPIC
BILIRUBIN URINE: NEGATIVE
Bacteria, UA: NONE SEEN
Glucose, UA: NEGATIVE mg/dL
Ketones, ur: NEGATIVE mg/dL
Leukocytes, UA: NEGATIVE
Nitrite: NEGATIVE
Protein, ur: NEGATIVE mg/dL
SPECIFIC GRAVITY, URINE: 1.009 (ref 1.005–1.030)
pH: 6 (ref 5.0–8.0)

## 2018-05-26 LAB — RPR: RPR Ser Ql: NONREACTIVE

## 2018-05-26 MED ORDER — OXYCODONE-ACETAMINOPHEN 5-325 MG PO TABS: 1.0000 | ORAL_TABLET | Freq: Four times a day (QID) | ORAL | 0 refills | Status: DC | PRN

## 2018-05-26 NOTE — Progress Notes (Signed)
Discharge order received from doctor. Reviewed discharge instructions and prescriptions with patient and answered all questions. Follow up appointment instructions given. Patient verbalized understanding. ID bands checked. Patient discharged home with infant via wheelchair by nursing/auxillary.    Danielle Wilkie Garner, RN  

## 2018-05-26 NOTE — Telephone Encounter (Signed)
[employer] calling to verify medical information given by the pt.  [phone]  Claim# [id number].  Information given.

## 2018-05-26 NOTE — Discharge Instructions (Signed)
Please call your doctor or return to the ER if you experience any chest pains, shortness of breath, dizziness, visual changes, fever greater than 101, any breast concerns, any heavy bleeding (saturating more than 1 pad per hour), large clots, or foul smelling discharge, any worsening abdominal pain and cramping that is not controlled by pain medication, or any signs of postpartum depression. No tampons, enemas, douches, or sexual intercourse for 6 weeks. Also avoid tub baths, hot tubs, or swimming for 6 weeks.    Check your incision daily for any signs of infection such as redness, warmth, swelling, increased pain, or pus/foul smelling drainage

## 2018-05-26 NOTE — Anesthesia Postprocedure Evaluation (Signed)
Anesthesia Post Note  Patient: Danielle Romero  Procedure(s) Performed: POST PARTUM TUBAL LIGATION (Bilateral Abdomen)  Patient location during evaluation: PACU Anesthesia Type: Spinal Level of consciousness: oriented and awake and alert Pain management: pain level controlled Vital Signs Assessment: post-procedure vital signs reviewed and stable Respiratory status: spontaneous breathing, respiratory function stable and nonlabored ventilation Cardiovascular status: blood pressure returned to baseline and stable Postop Assessment: no headache, no backache and spinal receding Anesthetic complications: no     Last Vitals:  Vitals:   05/26/18 0344 05/26/18 0751  BP: 124/68 129/69  Pulse: 72 77  Resp: 20 18  Temp: 36.6 C 36.9 C  SpO2: 99% 98%    Last Pain:  Vitals:   05/26/18 0820  TempSrc:   PainSc: 4                  Lawarence Meek

## 2018-05-27 LAB — URINE CULTURE: Culture: <10000 — AB

## 2018-05-27 LAB — SURGICAL PATHOLOGY

## 2018-05-30 ENCOUNTER — Encounter: Payer: Self-pay | Admitting: Obstetrics & Gynecology

## 2018-05-30 ENCOUNTER — Other Ambulatory Visit: Payer: Self-pay

## 2018-05-30 MED ORDER — OXYCODONE-ACETAMINOPHEN 5-325 MG PO TABS: 1.0000 | ORAL_TABLET | Freq: Four times a day (QID) | ORAL | 0 refills | Status: DC | PRN

## 2018-05-30 NOTE — Telephone Encounter (Signed)
Let me forward this to you since you did her tubal. Danielle Romero

## 2018-05-30 NOTE — Telephone Encounter (Signed)
Sent to JS's MA

## 2018-06-15 ENCOUNTER — Inpatient Hospital Stay: Payer: 59

## 2018-06-17 ENCOUNTER — Inpatient Hospital Stay: Payer: 59 | Admitting: Oncology

## 2018-06-17 ENCOUNTER — Inpatient Hospital Stay: Payer: 59

## 2018-06-17 NOTE — Anesthesia Postprocedure Evaluation (Signed)
Anesthesia Post Note  Patient: Danielle Romero  Procedure(s) Performed: AN AD [place]  Patient location during evaluation: Mother Baby Anesthesia Type: Epidural Level of consciousness: awake and alert Pain management: pain level controlled Vital Signs Assessment: post-procedure vital signs reviewed and stable Respiratory status: spontaneous breathing, nonlabored ventilation and respiratory function stable Cardiovascular status: stable Postop Assessment: no headache, no backache and epidural receding Anesthetic complications: no Comments: Patient seen by Dr. Randa Lynn prior to her PPTL and patient was doing well without complaints     Last Vitals: There were no vitals filed for this visit.  Last Pain: There were no vitals filed for this visit.               Martha Clan

## 2018-07-07 ENCOUNTER — Ambulatory Visit (INDEPENDENT_AMBULATORY_CARE_PROVIDER_SITE_OTHER): Payer: 59 | Admitting: Obstetrics & Gynecology

## 2018-07-07 ENCOUNTER — Encounter: Payer: Self-pay | Admitting: Obstetrics & Gynecology

## 2018-07-07 DIAGNOSIS — Z1389 Encounter for screening for other disorder: Secondary | ICD-10-CM

## 2018-07-07 NOTE — Patient Instructions (Signed)
Kegel Exercises  Kegel exercises help strengthen the muscles that support the rectum, vagina, small intestine, bladder, and uterus. Doing Kegel exercises can help:   Improve bladder and bowel control.   Improve sexual response.   Reduce problems and discomfort during pregnancy.  Kegel exercises involve squeezing your pelvic floor muscles, which are the same muscles you squeeze when you try to stop the flow of urine. The exercises can be done while sitting, standing, or lying down, but it is best to vary your position.  Exercises  1. Squeeze your pelvic floor muscles tight. You should feel a tight lift in your rectal area. If you are a female, you should also feel a tightness in your vaginal area. Keep your stomach, buttocks, and legs relaxed.  2. Hold the muscles tight for up to 10 seconds.  3. Relax your muscles.  Repeat this exercise 50 times a day or as many times as told by your health care provider. Continue to do this exercise for at least 4-6 weeks or for as long as told by your health care provider.  This information is not intended to replace advice given to you by your health care provider. Make sure you discuss any questions you have with your health care provider.  Document Released: 06/15/2012 Document Revised: 11/09/2016 Document Reviewed: 05/19/2015  Elsevier Interactive Patient Education  2019 Elsevier Inc[employer]

## 2018-07-07 NOTE — Progress Notes (Signed)
  OBSTETRICS POSTPARTUM CLINIC PROGRESS NOTE  Subjective:     Danielle Romero is a 37 y.o. [phone] female who presents for a postpartum visit. She is 6 weeks postpartum following a Term pregnancy and delivery by Vaginal, no problems at delivery.  I have fully reviewed the prenatal and intrapartum course. Anesthesia: epidural.  Postpartum course has been complicated by uncomplicated.  Baby is feeding by Bottle.  Bleeding: patient has  resumed menses.  Bowel function is normal. Bladder function is normal.  Patient is not sexually active. Contraception method desired is tubal ligation Postpartum depression screening: negative. Edinburgh 3.  The following portions of the patient's history were reviewed and updated as appropriate: allergies, current medications, past family history, past medical history, past social history, past surgical history and problem list.  Review of Systems Pertinent items are noted in HPI.  Objective:    BP 130/90   Ht 5\' 7"  (1.702 m)   Wt 216 lb (98 kg)   LMP 07/03/2018   BMI 33.83 kg/m   General:  alert and no distress   Breasts:  inspection negative, no nipple discharge or bleeding, no masses or nodularity palpable  Lungs: clear to auscultation bilaterally  Heart:  regular rate and rhythm, S1, S2 normal, no murmur, click, rub or gallop  Abdomen: soft, non-tender; bowel sounds normal; no masses,  no organomegaly.  Well healed tubal incision   Vulva:  normal  Vagina: normal vagina, no discharge, exudate, lesion, or erythema  Cervix:  no cervical motion tenderness and no lesions  Corpus: normal size, contour, position, consistency, mobility, non-tender  Adnexa:  normal adnexa and no mass, fullness, tenderness  Rectal Exam: Not performed.        Assessment:  Post Partum Care visit 1. Postpartum care following vaginal delivery Kegels for support, LOU RTW note provided  Plan:  See orders and Patient Instructions Follow up in: 6 months or as needed.     Barnett Applebaum, MD, [facility], [facility] 07/07/2018  10:28 AM

## 2018-12-01 ENCOUNTER — Other Ambulatory Visit: Payer: Self-pay

## 2018-12-01 ENCOUNTER — Encounter: Payer: Self-pay | Admitting: Emergency Medicine

## 2018-12-01 ENCOUNTER — Emergency Department
Admission: EM | Admit: 2018-12-01 | Discharge: 2018-12-01 | Disposition: A | Discharge status: Home / Self Care | Payer: 59 | Attending: Emergency Medicine | Admitting: Emergency Medicine

## 2018-12-01 DIAGNOSIS — F17210 Nicotine dependence, cigarettes, uncomplicated: Secondary | ICD-10-CM | POA: Insufficient documentation

## 2018-12-01 DIAGNOSIS — N3001 Acute cystitis with hematuria: Secondary | ICD-10-CM | POA: Insufficient documentation

## 2018-12-01 DIAGNOSIS — R319 Hematuria, unspecified: Secondary | ICD-10-CM | POA: Diagnosis present

## 2018-12-01 LAB — URINALYSIS, COMPLETE (UACMP) WITH MICROSCOPIC
Bacteria, UA: NONE SEEN
RBC / HPF: >50 RBC/hpf — ABNORMAL HIGH (ref 0–5)
Specific Gravity, Urine: 1.015 (ref 1.005–1.030)
Squamous Epithelial / LPF: NONE SEEN (ref 0–5)
WBC, UA: >50 WBC/hpf — ABNORMAL HIGH (ref 0–5)

## 2018-12-01 LAB — POCT PREGNANCY, URINE: Preg Test, Ur: NEGATIVE

## 2018-12-01 MED ORDER — CEPHALEXIN 500 MG PO CAPS: 500.0000 mg | ORAL_CAPSULE | Freq: Two times a day (BID) | ORAL | 0 refills | Status: AC

## 2018-12-01 NOTE — Discharge Instructions (Signed)
Return to the ER or see your primary care provider if you develop a fever, pain in your back, or pain in the pelvic area.  Follow up with urology if the blood does not go away after taking the antibiotic.

## 2018-12-01 NOTE — ED Provider Notes (Signed)
[facility] Emergency Department Provider Note  ____________________________________________  Time seen: Approximately 7:21 AM  I have reviewed the triage vital signs and the nursing notes.   HISTORY  Chief Complaint Hematuria    HPI Danielle Romero is a 38 y.o. female who presents to the emergency department for treatment and evaluation of hematuria. She states that yesterday morning when she went to the bathroom, she noticed that her urine was an orange color. Throughout the day it became less colored. This morning when she urinated, "the toilet was full of blood." She reports her LMP was about 2 weeks ago. She does not see any blood in her underwear. Blood is only there when she urinates. She denies dysuria, flank pain, recent medication changes, recent URI/sore throat. She denies previous incidences of hematuria. The only other symptom that may/may not be related is malodorous urine for the past several months, which has resolved sine yesterday morning.  Past Medical History:  Diagnosis Date  . Breast mass   . Bronchitis   . GERD (gastroesophageal reflux disease)    NO MEDS  . Obesity (BMI 30-39.9)     Patient Active Problem List   Diagnosis Date Noted  . Postpartum care following vaginal delivery 07/07/2018  . Encounter for planned induction of labor 05/24/2018  . Anemia of pregnancy 04/07/2018  . Heartburn during pregnancy in second trimester 01/26/2018  . 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 History of cesarean delivery 10/06/2017  . Advanced maternal age in multigravida 10/06/2017  . Supervision of high risk pregnancy, antepartum 09/29/2017  . Breast mass, left   . Abnormal mammogram of left breast     Past Surgical History:  Procedure Laterality Date  . BREAST BIOPSY Left 08/13/2016   Procedure: left BREAST BIOPSY WITH NEEDLE LOCALIZATION left Moeller's gland excision;  Surgeon: Florene Glen, MD;  Location: [facility]  ORS;  Service: General;  Laterality: Left;  . CESAREAN SECTION  2007  . TUBAL LIGATION Bilateral 05/25/2018   Procedure: POST PARTUM TUBAL LIGATION;  Surgeon: Homero Fellers, MD;  Location: [facility] ORS;  Service: Gynecology;  Laterality: Bilateral;    Prior to Admission medications   Medication Sig Start Date End Date Taking? Authorizing Provider  cephALEXin (KEFLEX) 500 MG capsule Take 1 capsule (500 mg total) by mouth 2 (two) times daily for 7 days. 12/01/18 12/08/18  Victorino Dike, FNP    Allergies Patient has no known allergies.  Family History  Problem Relation Age of Onset  . Hypertension Mother   . Breast cancer Mother 71  . Kidney disease Mother   . Cirrhosis Father   . Alcohol abuse Father     Social History Social History   Tobacco Use  . Smoking status: Current Every Day Smoker    Packs/day: 0.25    Years: 16.00    Pack years: 4.00    Types: Cigarettes  . Smokeless tobacco: Never Used  . Tobacco comment: 2 CIG PER DAY/trying to quit  Substance Use Topics  . Alcohol use: Not Currently    Comment: Occasional  . Drug use: Never    Review of Systems Constitutional: Negative for fever. Respiratory: Negative for shortness of breath or cough. Gastrointestinal: Negative for abdominal pain; negative for nausea , negative for vomiting. Genitourinary: Negative for dysuria , negative for vaginal discharge. Positive for hematuria. Musculoskeletal: Negative for back pain. Skin: Negative for acute skin changes/rash/lesion. ____________________________________________   PHYSICAL EXAM:  VITAL SIGNS: ED Triage Vitals [12/01/18  0714]  Enc Vitals Group     BP 145/87     Pulse 88     Resp 18     Temp 98.9     Temp src      SpO2 100     Weight 228 lb (103.4 kg)     Height 5\' 7"  (1.702 m)     Head Circumference      Peak Flow      Pain Score 0     Pain Loc      Pain Edu?      Excl. in [place]?     Constitutional: Alert and oriented. Well appearing and in no  acute distress. Eyes: Conjunctivae are normal. Head: Atraumatic. Nose: No congestion/rhinnorhea. Mouth/Throat: Mucous membranes are moist. Respiratory: Normal respiratory effort.  No retractions. Gastrointestinal: Bowel sounds active x 4; Abdomen is soft without rebound or guarding. Genitourinary: Pelvic exam: not indicated Musculoskeletal: No extremity tenderness nor edema.  Neurologic:  Normal speech and language. No gross focal neurologic deficits are appreciated. Speech is normal. No gait instability. Skin:  Skin is warm, dry and intact. No rash noted on exposed skin. Psychiatric: Mood and affect are normal. Speech and behavior are normal.  ____________________________________________   LABS (all labs ordered are listed, but only abnormal results are displayed)  Labs Reviewed  URINALYSIS, COMPLETE (UACMP) WITH MICROSCOPIC - Abnormal; Notable for the following components:      Result Value   Color, Urine RED (*)    APPearance TURBID (*)    Glucose, UA   (*)    Value: TEST NOT REPORTED DUE TO COLOR INTERFERENCE OF URINE PIGMENT   Hgb urine dipstick   (*)    Value: TEST NOT REPORTED DUE TO COLOR INTERFERENCE OF URINE PIGMENT   Bilirubin Urine   (*)    Value: TEST NOT REPORTED DUE TO COLOR INTERFERENCE OF URINE PIGMENT   Ketones, ur   (*)    Value: TEST NOT REPORTED DUE TO COLOR INTERFERENCE OF URINE PIGMENT   Protein, ur   (*)    Value: TEST NOT REPORTED DUE TO COLOR INTERFERENCE OF URINE PIGMENT   Nitrite   (*)    Value: TEST NOT REPORTED DUE TO COLOR INTERFERENCE OF URINE PIGMENT   Leukocytes,Ua   (*)    Value: TEST NOT REPORTED DUE TO COLOR INTERFERENCE OF URINE PIGMENT   RBC / HPF >50 (*)    WBC, UA >50 (*)    All other components within normal limits  URINE CULTURE  POCT PREGNANCY, URINE   ____________________________________________  RADIOLOGY  Not  indicated. ____________________________________________  Procedures  ____________________________________________  38 year old female presenting to the emergency department for evaluation of hematuria.  Exam is benign and reassuring.  Urinalysis is fairly nondescript secondary to hematuria however it does show greater than 50 white blood cells. Urine culture ordered. Patient will be treated with 1 week of Keflex and given a referral to urology.  If hematuria does not resolve during the time on antibiotics, she is to call and schedule follow-up appointment.  Either way she was advised to follow-up with her primary care provider in a couple of weeks.  She was given strict ER return precautions for fever, onset of back pain, or onset of pelvic pain.  INITIAL IMPRESSION / ASSESSMENT AND PLAN / ED COURSE  Pertinent labs & imaging results that were available during my care of the patient were reviewed by me and considered in my medical decision making (see chart for details).  ____________________________________________   FINAL CLINICAL IMPRESSION(S) / ED DIAGNOSES  Final diagnoses:  Acute cystitis with hematuria    Note:  This document was prepared using Dragon voice recognition software and may include unintentional dictation errors.   Victorino Dike, FNP 12/01/18 [place], [place], MD 12/01/18 1020

## 2018-12-01 NOTE — ED Notes (Signed)
See triage note  Presents with "blood in urine"   States she noticed some yesterday and became worse this am  Denies any pain   States last period was about 10 days ago   But did have some "foul" smelling urine prior to period

## 2018-12-01 NOTE — ED Triage Notes (Signed)
Pt reports she is peeing blood. Pt states started yesterday. Pt reports nothing is there when she wipe so it is not her cycle. Pt reports no pain in her back or elsewhere, no urinary frequency or urgency and no dysuria.

## 2018-12-03 LAB — URINE CULTURE: Culture: >=100000 — AB

## 2019-03-07 ENCOUNTER — Telehealth: Payer: Self-pay

## 2019-03-07 NOTE — Telephone Encounter (Signed)
Appt w UA

## 2019-03-07 NOTE — Telephone Encounter (Signed)
Pt is calling stating she feels like she has a UTI she is experiencing pelvic pain, constant urge to go to the bathroom and lower back pain. She is wanting to know if she can have an antibiotic called in or if she needs to come in to [place] for this problem. Please advise, Thank you

## 2019-03-08 NOTE — Telephone Encounter (Signed)
Called and LVM for pt to call back and schedule a Nurse visit either today or tomorrow so that she is not suffering with UTI symptoms for to long.

## 2019-03-09 ENCOUNTER — Ambulatory Visit
Admission: EM | Admit: 2019-03-09 | Discharge: 2019-03-09 | Disposition: A | Discharge status: Home / Self Care | Payer: 59 | Attending: Urgent Care | Admitting: Urgent Care

## 2019-03-09 ENCOUNTER — Encounter: Payer: Self-pay | Admitting: Emergency Medicine

## 2019-03-09 ENCOUNTER — Other Ambulatory Visit: Payer: Self-pay

## 2019-03-09 DIAGNOSIS — R350 Frequency of micturition: Secondary | ICD-10-CM | POA: Diagnosis not present

## 2019-03-09 DIAGNOSIS — M545 Low back pain, unspecified: Secondary | ICD-10-CM

## 2019-03-09 LAB — URINALYSIS, COMPLETE (UACMP) WITH MICROSCOPIC
Bacteria, UA: NONE SEEN
Bilirubin Urine: NEGATIVE
Glucose, UA: NEGATIVE mg/dL
Hgb urine dipstick: NEGATIVE
Ketones, ur: NEGATIVE mg/dL
Leukocytes,Ua: NEGATIVE
Nitrite: NEGATIVE
Protein, ur: NEGATIVE mg/dL
RBC / HPF: NONE SEEN RBC/hpf (ref 0–5)
Specific Gravity, Urine: 1.02 (ref 1.005–1.030)
pH: 6.5 (ref 5.0–8.0)

## 2019-03-09 MED ORDER — KETOROLAC TROMETHAMINE 10 MG PO TABS: 10.0000 mg | ORAL_TABLET | Freq: Three times a day (TID) | ORAL | 0 refills | Status: DC | PRN

## 2019-03-09 MED ORDER — PHENAZOPYRIDINE HCL 200 MG PO TABS: 200.0000 mg | ORAL_TABLET | Freq: Three times a day (TID) | ORAL | 0 refills | Status: DC

## 2019-03-09 NOTE — ED Provider Notes (Signed)
[place], [place]   Name: Danielle Romero DOB: 1981-05-23 MRN: [id number] CSN: [id number] PCP: [place] [place] date and time:  03/09/19 1732  Chief Complaint:  Back Pain   NOTE: Prior to seeing the patient today, I have reviewed the triage nursing documentation and vital signs. Clinical staff has updated patient's PMH/PSHx, current medication list, and drug allergies/intolerances to ensure comprehensive history available to assist in medical decision making.   History:   HPI: Danielle Romero is a 38 y.o. female who presents today with complaints of urinary symptoms that began with acute onset 6 days ago. She complains of frequency and urgency, but only very mild dysuria. She has not appreciated any gross hematuria, nor has she noticed her urine being malodorous. Patient denies any associated nausea, vomiting, fever, and chills. Patient complains of pain in her lower back that radiates around into her flank area BILATERALLY. She describes this sensation as a "cramping". Patient advises that she has a significant history for recurrent urinary tract infections. Patient endorses that she engages in unprotected sexual activity. She notes that sexual activity is in the context of a committed monogamous relationship with a single female partner. She denies any vaginal pain, bleeding, or discharge. Patient's last menstrual period was 02/23/2019 (approximate). There are no concerns that she is currently pregnant.   Past Medical History:  Diagnosis Date  . Breast mass   . Bronchitis   . GERD (gastroesophageal reflux disease)    NO MEDS  . Obesity (BMI 30-39.9)     Past Surgical History:  Procedure Laterality Date  . BREAST BIOPSY Left 08/13/2016   Procedure: left BREAST BIOPSY WITH NEEDLE LOCALIZATION left Moeller's gland excision;  Surgeon: Florene Glen, MD;  Location: [facility] ORS;  Service: General;  Laterality: Left;  . CESAREAN SECTION  2007  . TUBAL LIGATION  Bilateral 05/25/2018   Procedure: POST PARTUM TUBAL LIGATION;  Surgeon: Homero Fellers, MD;  Location: [facility] ORS;  Service: Gynecology;  Laterality: Bilateral;    Family History  Problem Relation Age of Onset  . Hypertension Mother   . Breast cancer Mother 60  . Kidney disease Mother   . Cirrhosis Father   . Alcohol abuse Father     Social History   Tobacco Use  . Smoking status: Former Smoker    Packs/day: 0.25    Years: 16.00    Pack years: 4.00    Types: Cigarettes  . Smokeless tobacco: Never Used  . Tobacco comment: 2 CIG PER DAY/trying to quit  Substance Use Topics  . Alcohol use: Not Currently    Comment: Occasional  . Drug use: Never    Patient Active Problem List   Diagnosis Date Noted  . Postpartum care following vaginal delivery 07/07/2018  . Encounter for planned induction of labor 05/24/2018  . Anemia of pregnancy 04/07/2018  . Heartburn during pregnancy in second trimester 01/26/2018  . 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 History of cesarean delivery 10/06/2017  . Advanced maternal age in multigravida 10/06/2017  . Supervision of high risk pregnancy, antepartum 09/29/2017  . Breast mass, left   . Abnormal mammogram of left breast     Home Medications:    No outpatient medications have been marked as taking for the 03/09/19 encounter [facility] Encounter).    Allergies:   Patient has no known allergies.  Review of Systems (ROS): Review of Systems  Constitutional: Negative for chills and fever.  Respiratory: Negative for cough  and shortness of breath.   Cardiovascular: Negative for chest pain and palpitations.  Gastrointestinal: Negative for abdominal pain, diarrhea, nausea and vomiting.  Genitourinary: Positive for flank pain, frequency and urgency. Negative for dysuria, hematuria, pelvic pain, vaginal bleeding, vaginal discharge and vaginal pain.  Musculoskeletal: Positive for back pain.  All other systems reviewed  and are negative.    Vital Signs: Today's Vitals   03/09/19 1747 03/09/19 1748 03/09/19 1751 03/09/19 1823  BP:   (!) 136/99   Pulse:   81   Resp:   18   Temp:   98.8 F (37.1 C)   TempSrc:   Oral   SpO2:   100%   Weight:  223 lb (101.2 kg)    Height:  5\' 7"  (1.702 m)    PainSc: 6    6     Physical Exam: Physical Exam  Constitutional: She is oriented to person, place, and time and well-developed, well-nourished, and in no distress.  HENT:  Head: Normocephalic and atraumatic.  Mouth/Throat: Mucous membranes are normal.  Eyes: Pupils are equal, round, and reactive to light. EOM are normal.  Neck: Normal range of motion. Neck supple. No tracheal deviation present.  Cardiovascular: Normal rate, regular rhythm, normal heart sounds and intact distal pulses. Exam reveals no gallop and no friction rub.  No murmur heard. Pulmonary/Chest: Effort normal and breath sounds normal. No respiratory distress. She has no wheezes. She has no rales.  Abdominal: Soft. Normal appearance and bowel sounds are normal. There is no abdominal tenderness. There is no CVA tenderness.  Musculoskeletal:     Lumbar back: She exhibits pain (BILATERAL paralumbar muscle group; no midline tenderness or deformity). She exhibits no deformity and no spasm.  Neurological: She is alert and oriented to person, place, and time. Gait normal.  Skin: Skin is warm and dry. No rash noted.  Psychiatric: Mood, memory, affect and judgment normal.  Nursing note and vitals reviewed.   Urgent Care Treatments / Results:   LABS: PLEASE NOTE: all labs that were ordered this encounter are listed, however only abnormal results are displayed. Labs Reviewed  URINALYSIS, COMPLETE (UACMP) WITH MICROSCOPIC    EKG: -None  RADIOLOGY: No results found.  PROCEDURES: Procedures  MEDICATIONS RECEIVED THIS VISIT: Medications - No data to display  PERTINENT CLINICAL COURSE NOTES/UPDATES:   Initial Impression / Assessment and  Plan / Urgent Care Course:  Pertinent labs & imaging results that were available during my care of the patient were personally reviewed by me and considered in my medical decision making (see lab/imaging section of note for values and interpretations).  Danielle Romero is a 38 y.o. female who presents to [facility] today with complaints of urinary frequency/urgency and lower back pain.   Patient is well appearing overall in clinic today. She does not appear to be in any acute distress. Presenting symptoms (see HPI) and exam as documented above. UA was negative for infection. She denies any nausea, vomiting, or fevers. Patient mainly concerned with UTI as she notes that she "waited too long" with her last infection and became very ill. Pain in her lower back is BILATERAL, however she notes that is is worse on her RIGHT side. Suspect musculoskeletal component. Will add ketorolac for PRN use. Also, will add phenazopyridine for urinary symptoms as she feels "some spasms" at times. Patient to increase fluid intake.  She was encouraged to apply heat TID for at least 10-15 minutes at a time. She was instructed to return  a call to the clinic if not improving, at which time we will pursue alternative treatment options.  Discussed follow up with primary care physician in 1 week for re-evaluation. I have reviewed the follow up and strict return precautions for any new or worsening symptoms. Patient is aware of symptoms that would be deemed urgent/emergent, and would thus require further evaluation either here or in the emergency department. At the time of discharge, she verbalized understanding and consent with the discharge plan as it was reviewed with her. All questions were fielded by provider and/or clinic staff prior to patient discharge.    Final Clinical Impressions / Urgent Care Diagnoses:   Final diagnoses:  Acute bilateral low back pain without sciatica  Urinary frequency    New  Prescriptions:  [place] Controlled Substance Registry consulted? Not Applicable  Meds ordered this encounter  Medications  . ketorolac (TORADOL) 10 MG tablet    Sig: Take 1 tablet (10 mg total) by mouth every 8 (eight) hours as needed.    Dispense:  20 tablet    Refill:  0  . phenazopyridine (PYRIDIUM) 200 MG tablet    Sig: Take 1 tablet (200 mg total) by mouth 3 (three) times daily.    Dispense:  9 tablet    Refill:  0    Recommended Follow up Care:  Patient encouraged to follow up with the following provider within the specified time frame, or sooner as dictated by the severity of her symptoms. As always, she was instructed that for any urgent/emergent care needs, she should seek care either here or in the emergency department for more immediate evaluation.  Follow-up [place], [facility] In 1 week.   Specialty: General Practice Why: General reassessment of symptoms if not improving Contact information: [address] [place] [address] [phone]         NOTE: This note was prepared using Dragon dictation software along with smaller phrase technology. Despite my best ability to proofread, there is the potential that transcriptional errors may still occur from this process, and are completely unintentional.    Karen Kitchens, NP 03/09/19 1836

## 2019-03-09 NOTE — Discharge Instructions (Addendum)
It was very nice seeing you today in clinic. Thank you for entrusting me with your care.   Please utilize the medications that we discussed. Your prescriptions have been called in to your pharmacy. Moist heat to your back may also help.   Make arrangements to follow up with your regular doctor in 1 week for re-evaluation if not improving. If your symptoms/condition worsens, please seek follow up care either here or in the ER. Please remember, our [facility] providers are "right here with you" when you need [place].   Again, it was my pleasure to take care of you today. Thank you for choosing our clinic. I hope that you start to feel better quickly.   Honor Loh, MSN, APRN, FNP-C, CEN Advanced Practice Provider [facility]

## 2019-03-09 NOTE — ED Triage Notes (Signed)
Pt c/o lower back pain and lower abdominal pain. Started about 6 days ago. She states that last time she had this she had a UTI. Denies dysuria or fever.

## 2019-07-21 ENCOUNTER — Ambulatory Visit
Admission: EM | Admit: 2019-07-21 | Discharge: 2019-07-21 | Disposition: A | Discharge status: Home / Self Care | Payer: BC Managed Care – PPO | Attending: Family Medicine | Admitting: Family Medicine

## 2019-07-21 ENCOUNTER — Encounter: Payer: Self-pay | Admitting: Emergency Medicine

## 2019-07-21 ENCOUNTER — Other Ambulatory Visit: Payer: Self-pay

## 2019-07-21 DIAGNOSIS — E669 Obesity, unspecified: Secondary | ICD-10-CM | POA: Diagnosis not present

## 2019-07-21 DIAGNOSIS — R110 Nausea: Secondary | ICD-10-CM

## 2019-07-21 DIAGNOSIS — Z803 Family history of malignant neoplasm of breast: Secondary | ICD-10-CM | POA: Insufficient documentation

## 2019-07-21 DIAGNOSIS — Z6834 Body mass index (BMI) 34.0-34.9, adult: Secondary | ICD-10-CM | POA: Diagnosis not present

## 2019-07-21 DIAGNOSIS — Z9851 Tubal ligation status: Secondary | ICD-10-CM | POA: Diagnosis not present

## 2019-07-21 DIAGNOSIS — R430 Anosmia: Secondary | ICD-10-CM

## 2019-07-21 DIAGNOSIS — Z79899 Other long term (current) drug therapy: Secondary | ICD-10-CM | POA: Diagnosis not present

## 2019-07-21 DIAGNOSIS — U071 COVID-19: Secondary | ICD-10-CM | POA: Insufficient documentation

## 2019-07-21 DIAGNOSIS — F17210 Nicotine dependence, cigarettes, uncomplicated: Secondary | ICD-10-CM | POA: Insufficient documentation

## 2019-07-21 DIAGNOSIS — B349 Viral infection, unspecified: Secondary | ICD-10-CM | POA: Diagnosis present

## 2019-07-21 MED ORDER — ONDANSETRON 8 MG PO TBDP: 8.0000 mg | ORAL_TABLET | Freq: Three times a day (TID) | ORAL | 0 refills | Status: DC | PRN

## 2019-07-21 NOTE — ED Triage Notes (Signed)
Pt c/o nausea, hot flashes, runny nose, and loss of smell. Started 2 days ago with the runny nose. Denies fever, cough.

## 2019-07-21 NOTE — Discharge Instructions (Signed)
Rest, fluids, over the counter medications as needed Await covid test

## 2019-07-23 NOTE — ED Provider Notes (Signed)
[facility]    CSN: [id number] Arrival date & time: 07/21/19  0914      History   Chief Complaint Chief Complaint  Patient presents with  . Nausea  . loss of smell    HPI Danielle Romero is a 39 y.o. female.   39 yo female with a c/o nausea, chills, runny nose and loss of smell for the past 2 days. Denies any fevers, cough.      Past Medical History:  Diagnosis Date  . Breast mass   . Bronchitis   . GERD (gastroesophageal reflux disease)    NO MEDS  . Obesity (BMI 30-39.9)     Patient Active Problem List   Diagnosis Date Noted  . Postpartum care following vaginal delivery 07/07/2018  . Encounter for planned induction of labor 05/24/2018  . Anemia of pregnancy 04/07/2018  . Heartburn during pregnancy in second trimester 01/26/2018  . 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 History of cesarean delivery 10/06/2017  . Advanced maternal age in multigravida 10/06/2017  . Supervision of high risk pregnancy, antepartum 09/29/2017  . Breast mass, left   . Abnormal mammogram of left breast     Past Surgical History:  Procedure Laterality Date  . BREAST BIOPSY Left 08/13/2016   Procedure: left BREAST BIOPSY WITH NEEDLE LOCALIZATION left Moeller's gland excision;  Surgeon: Florene Glen, MD;  Location: [facility] ORS;  Service: General;  Laterality: Left;  . CESAREAN SECTION  2007  . TUBAL LIGATION Bilateral 05/25/2018   Procedure: POST PARTUM TUBAL LIGATION;  Surgeon: Homero Fellers, MD;  Location: [facility] ORS;  Service: Gynecology;  Laterality: Bilateral;    OB History    Gravida  6   Para  5   Term  5   Preterm      AB  1   Living  5     SAB      TAB      Ectopic      Multiple  0   Live Births  5            Home Medications    Prior to Admission medications   Medication Sig Start Date End Date Taking? Authorizing Provider  ketorolac (TORADOL) 10 MG tablet Take 1 tablet (10 mg total) by mouth every 8  (eight) hours as needed. 03/09/19   Karen Kitchens, NP  ondansetron (ZOFRAN ODT) 8 MG disintegrating tablet Take 1 tablet (8 mg total) by mouth every 8 (eight) hours as needed. 07/21/19   Norval Gable, MD  phenazopyridine (PYRIDIUM) 200 MG tablet Take 1 tablet (200 mg total) by mouth 3 (three) times daily. 03/09/19   Karen Kitchens, NP    Family History Family History  Problem Relation Age of Onset  . Hypertension Mother   . Breast cancer Mother 89  . Kidney disease Mother   . Cirrhosis Father   . Alcohol abuse Father     Social History Social History   Tobacco Use  . Smoking status: Current Every Day Smoker    Packs/day: 0.25    Years: 16.00    Pack years: 4.00    Types: Cigarettes  . Smokeless tobacco: Never Used  Substance Use Topics  . Alcohol use: Not Currently    Comment: Occasional  . Drug use: Never     Allergies   Patient has no known allergies.   Review of Systems Review of Systems   Physical Exam Triage Vital Signs ED Triage  Vitals  Enc Vitals Group     BP 07/21/19 0932 (!) 150/106     Pulse Rate 07/21/19 0932 82     Resp 07/21/19 0932 18     Temp 07/21/19 0932 98 F (36.7 C)     Temp Source 07/21/19 0932 Oral     SpO2 07/21/19 0932 100 %     Weight 07/21/19 0929 223 lb (101.2 kg)     Height 07/21/19 0929 5\' 7"  (1.702 m)     Head Circumference --      Peak Flow --      Pain Score 07/21/19 0929 0     Pain Loc --      Pain Edu? --      Excl. in [place]? --    No data found.  Updated Vital Signs BP (!) 150/106 (BP Location: Right Arm)   Pulse 82   Temp 98 F (36.7 C) (Oral)   Resp 18   Ht 5\' 7"  (1.702 m)   Wt 101.2 kg   LMP 07/07/2019 (Approximate)   SpO2 100%   BMI 34.93 kg/m   Visual Acuity Right Eye Distance:   Left Eye Distance:   Bilateral Distance:    Right Eye Near:   Left Eye Near:    Bilateral Near:     Physical Exam Vitals and nursing note reviewed.  Constitutional:      General: She is not in acute distress.     Appearance: She is not toxic-appearing or diaphoretic.  Cardiovascular:     Rate and Rhythm: Normal rate.     Heart sounds: Normal heart sounds.  Pulmonary:     Effort: Pulmonary effort is normal. No respiratory distress.     Breath sounds: Normal breath sounds.  Neurological:     Mental Status: She is alert.      UC Treatments / Results  Labs (all labs ordered are listed, but only abnormal results are displayed) Labs Reviewed  NOVEL CORONAVIRUS, NAA (HOSP ORDER, SEND-OUT TO REF LAB; TAT 18-24 HRS) - Abnormal; Notable for the following components:      Result Value   SARS-CoV-2, NAA DETECTED (*)    All other components within normal limits    EKG   Radiology No results found.  Procedures Procedures (including critical care time)  Medications Ordered in UC Medications - No data to display  Initial Impression / Assessment and Plan / UC Course  I have reviewed the triage vital signs and the nursing notes.  Pertinent labs & imaging results that were available during my care of the patient were reviewed by me and considered in my medical decision making (see chart for details).      Final Clinical Impressions(s) / UC Diagnoses   Final diagnoses:  Viral syndrome  Nausea     Discharge Instructions     Rest, fluids, over the counter medications as needed Await covid test    ED Prescriptions    Medication Sig Dispense Auth. Provider   ondansetron (ZOFRAN ODT) 8 MG disintegrating tablet Take 1 tablet (8 mg total) by mouth every 8 (eight) hours as needed. 6 tablet Norval Gable, MD      1. diagnosis reviewed with patient 2. rx as per orders above; reviewed possible side effects, interactions, risks and benefits  3. Recommend supportive treatment as above 4. Follow-up prn if symptoms worsen or don't improve   PDMP not reviewed this encounter.   Norval Gable, MD 07/23/19 [phone]

## 2019-07-24 ENCOUNTER — Telehealth (HOSPITAL_COMMUNITY): Payer: Self-pay | Admitting: Emergency Medicine

## 2019-07-24 ENCOUNTER — Encounter (HOSPITAL_COMMUNITY): Payer: Self-pay

## 2019-07-24 NOTE — Telephone Encounter (Signed)

## 2019-07-26 LAB — NOVEL CORONAVIRUS, NAA (HOSP ORDER, SEND-OUT TO REF LAB; TAT 18-24 HRS): SARS-CoV-2, NAA: DETECTED — AB

## 2019-09-07 IMAGING — US US PELVIS LIMITED
1 series · 13 of 13 positions shown · non-contrast
Comparison: CT abdomen and pelvis 01/05/2011

CLINICAL DATA: Suprapubic pain.  Shistosomiasis.

EXAM:
LIMITED ULTRASOUND OF PELVIS
TECHNIQUE: Limited transabdominal ultrasound examination of the pelvis was
performed.

[Series 1: us pelvis limited · 0.20mm/px · 13 of 13 slices shown]
[im 1/13]
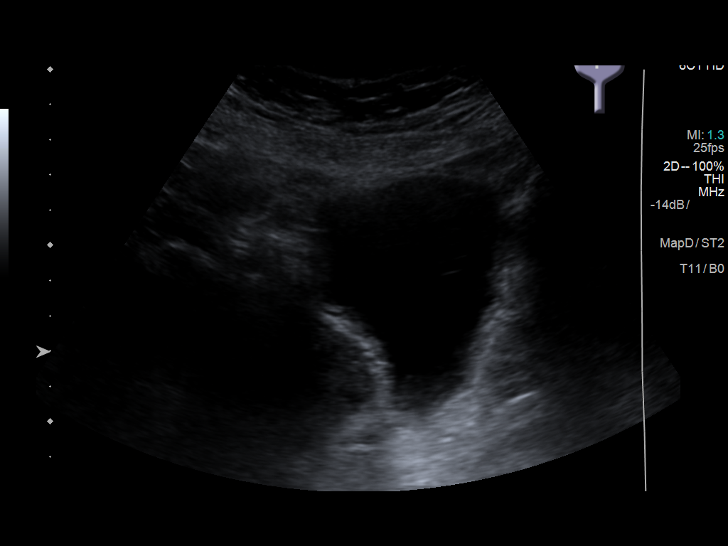
[im 2/13]
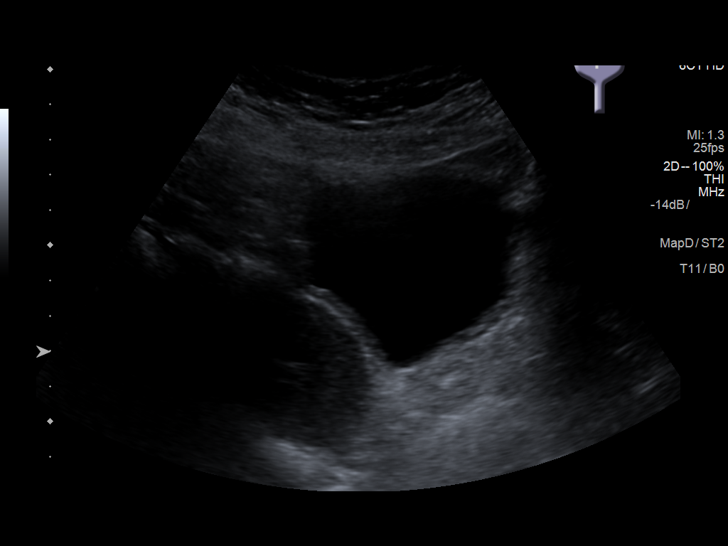
[im 3/13]
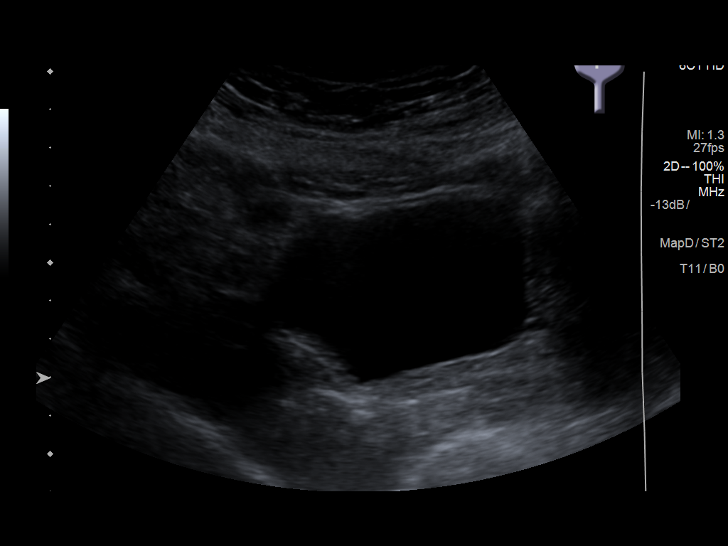
[im 4/13]
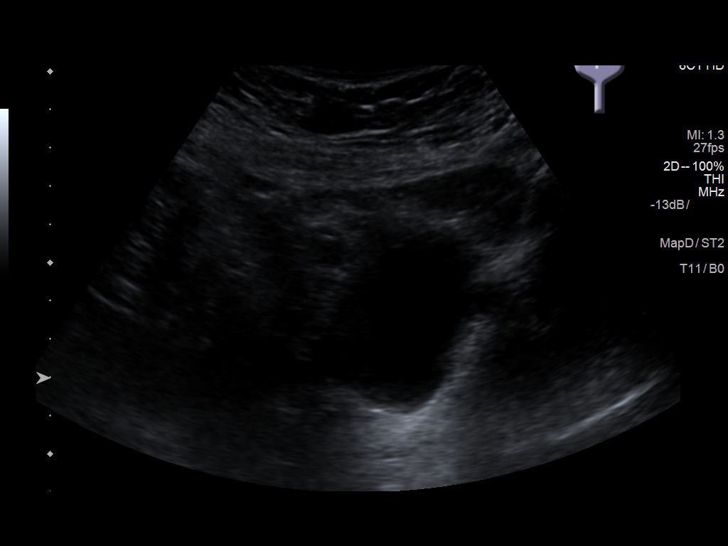
[im 5/13]
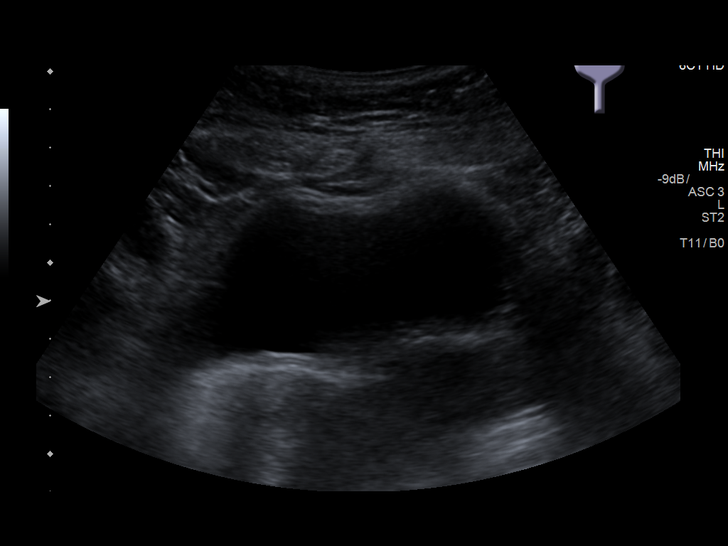
[im 6/13]
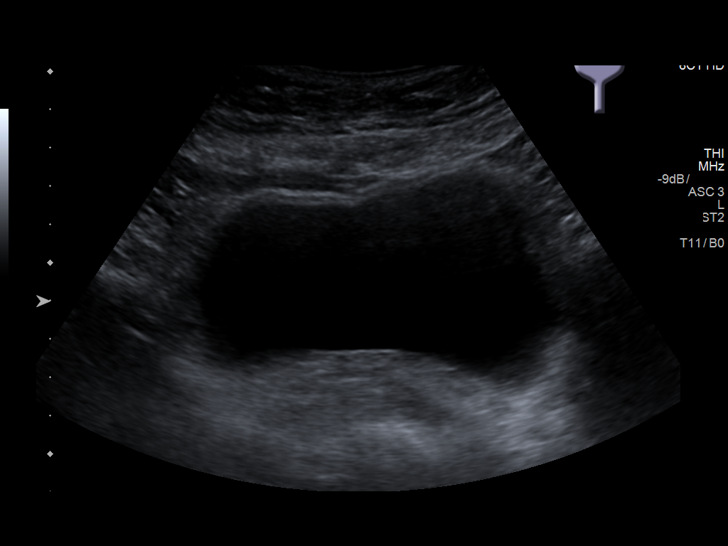
[im 7/13]
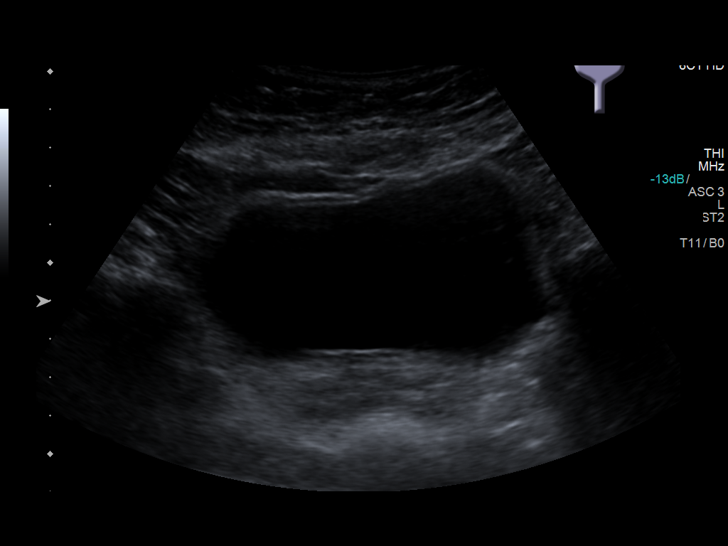
[im 8/13]
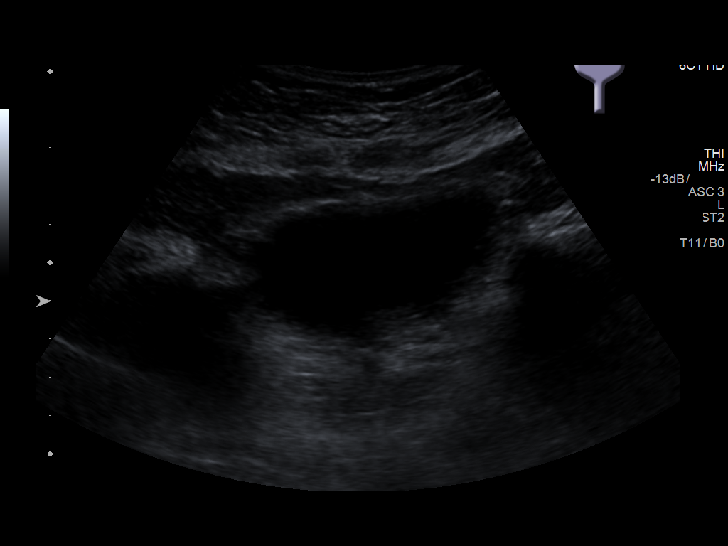
[im 9/13]
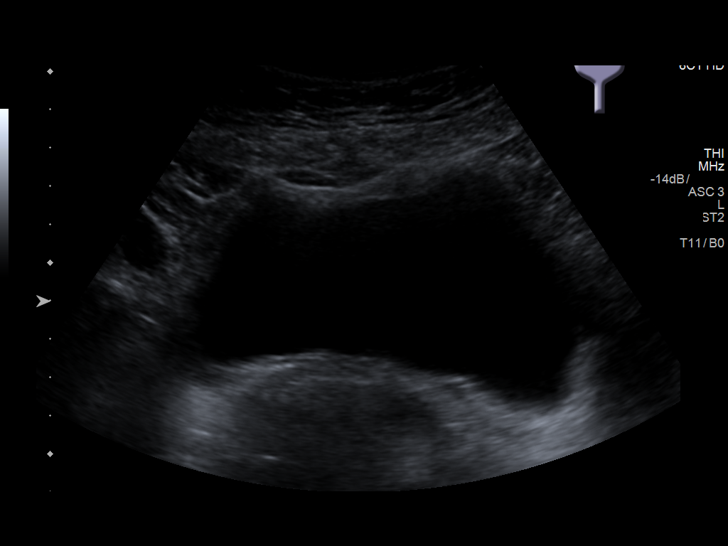
[im 10/13]
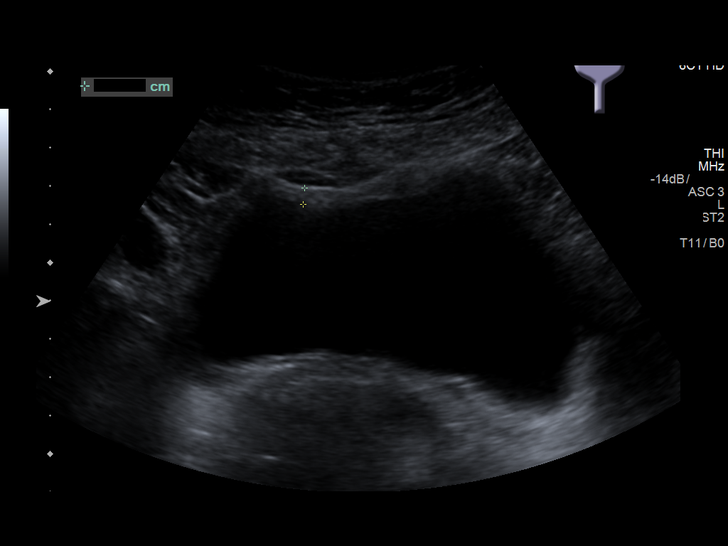
[im 11/13]
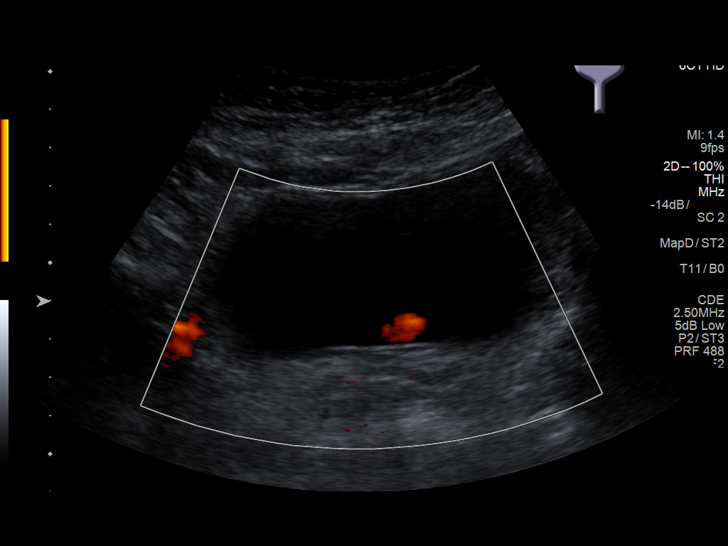
[im 12/13]
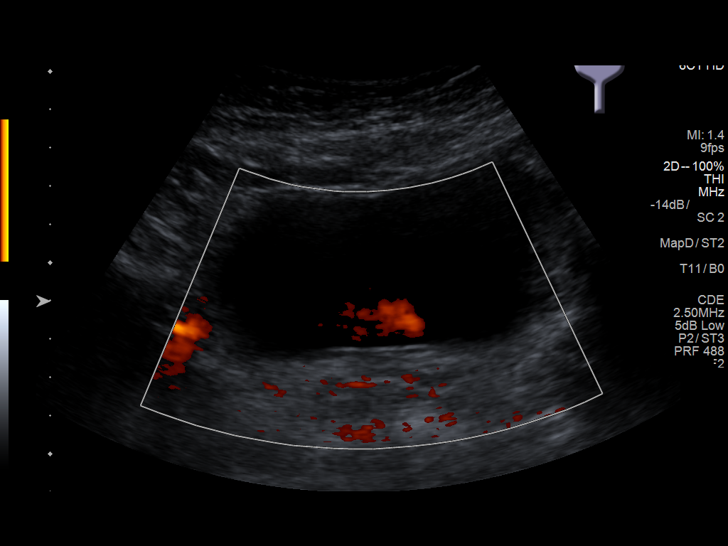
[im 13/13]
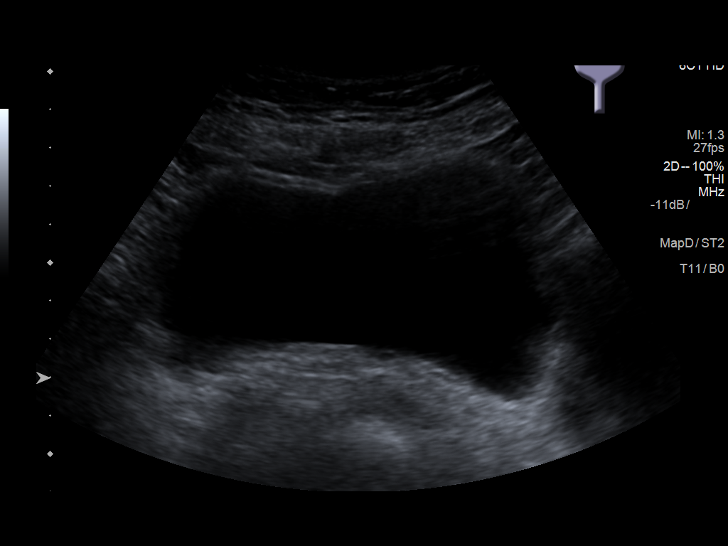

[13 of 13 positions shown; findings below may reference images not displayed]

FINDINGS: The urinary bladder has a normal appearance for the degree of
distention. No bladder wall thickening or bladder filling defects
identified.

Other findings: Bilateral urine flow jets are demonstrated on color
flow Doppler imaging.
IMPRESSION: Normal ultrasound appearance of the bladder.

## 2020-01-05 ENCOUNTER — Other Ambulatory Visit: Payer: Self-pay

## 2020-01-05 ENCOUNTER — Encounter: Payer: Self-pay | Admitting: Emergency Medicine

## 2020-01-05 ENCOUNTER — Ambulatory Visit
Admission: EM | Admit: 2020-01-05 | Discharge: 2020-01-05 | Disposition: A | Discharge status: Home / Self Care | Payer: BC Managed Care – PPO | Attending: Family Medicine | Admitting: Family Medicine

## 2020-01-05 DIAGNOSIS — B9689 Other specified bacterial agents as the cause of diseases classified elsewhere: Secondary | ICD-10-CM | POA: Diagnosis present

## 2020-01-05 DIAGNOSIS — B3731 Acute candidiasis of vulva and vagina: Secondary | ICD-10-CM

## 2020-01-05 DIAGNOSIS — N760 Acute vaginitis: Secondary | ICD-10-CM | POA: Insufficient documentation

## 2020-01-05 DIAGNOSIS — B373 Candidiasis of vulva and vagina: Secondary | ICD-10-CM | POA: Insufficient documentation

## 2020-01-05 LAB — URINALYSIS, COMPLETE (UACMP) WITH MICROSCOPIC
Bilirubin Urine: NEGATIVE
Glucose, UA: NEGATIVE mg/dL
Hgb urine dipstick: NEGATIVE
Ketones, ur: NEGATIVE mg/dL
Leukocytes,Ua: NEGATIVE
Nitrite: POSITIVE — AB
Protein, ur: NEGATIVE mg/dL
RBC / HPF: NONE SEEN RBC/hpf (ref 0–5)
Specific Gravity, Urine: 1.02 (ref 1.005–1.030)
pH: 7 (ref 5.0–8.0)

## 2020-01-05 LAB — WET PREP, GENITAL
Sperm: NONE SEEN
Trich, Wet Prep: NONE SEEN

## 2020-01-05 MED ORDER — METRONIDAZOLE 500 MG PO TABS: 500.0000 mg | ORAL_TABLET | Freq: Two times a day (BID) | ORAL | 0 refills | Status: DC

## 2020-01-05 MED ORDER — FLUCONAZOLE 150 MG PO TABS: 150.0000 mg | ORAL_TABLET | Freq: Once | ORAL | 0 refills | Status: AC

## 2020-01-05 NOTE — ED Triage Notes (Addendum)
Patient in today c/o malodorous urine x 2-3 weeks. Patient started with flank pain and dysuria x 1 day. Patient also c/o vaginal discharge x 1 week. Patient has not taken any OTC medications.

## 2020-01-06 NOTE — ED Provider Notes (Signed)
[facility]    CSN: [id number] Arrival date & time: 01/05/20  1820  History   Chief Complaint Chief Complaint  Patient presents with  . Flank Pain  . Dysuria   HPI 39 year old female presents with the above complaints.  Patient reports that she has had foul-smelling urine for the past 2 to 3 weeks.  Patient reports that she developed flank pain and dysuria over the past 24 hours.  She reports ongoing vaginal discharge for the past week.  Patient is concerned that she has UTI.  No medications or interventions tried.  No relieving factors.  No other associated symptoms.  No other complaints.  Past Medical History:  Diagnosis Date  . Breast mass   . Bronchitis   . GERD (gastroesophageal reflux disease)    NO MEDS  . Obesity (BMI 30-39.9)     Patient Active Problem List   Diagnosis Date Noted  . Postpartum care following vaginal delivery 07/07/2018  . Encounter for planned induction of labor 05/24/2018  . Anemia of pregnancy 04/07/2018  . Heartburn during pregnancy in second trimester 01/26/2018  . 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 History of cesarean delivery 10/06/2017  . Advanced maternal age in multigravida 10/06/2017  . Supervision of high risk pregnancy, antepartum 09/29/2017  . Breast mass, left   . Abnormal mammogram of left breast     Past Surgical History:  Procedure Laterality Date  . BREAST BIOPSY Left 08/13/2016   Procedure: left BREAST BIOPSY WITH NEEDLE LOCALIZATION left Moeller's gland excision;  Surgeon: Florene Glen, MD;  Location: [facility] ORS;  Service: General;  Laterality: Left;  . CESAREAN SECTION  2007  . TUBAL LIGATION Bilateral 05/25/2018   Procedure: POST PARTUM TUBAL LIGATION;  Surgeon: Homero Fellers, MD;  Location: [facility] ORS;  Service: Gynecology;  Laterality: Bilateral;    OB History    Gravida  6   Para  5   Term  5   Preterm      AB  1   Living  5     SAB      TAB      Ectopic       Multiple  0   Live Births  5            Home Medications    Prior to Admission medications   Medication Sig Start Date End Date Taking? Authorizing Provider  metroNIDAZOLE (FLAGYL) 500 MG tablet Take 1 tablet (500 mg total) by mouth 2 (two) times daily. 01/05/20   Coral Spikes, DO    Family History Family History  Problem Relation Age of Onset  . Hypertension Mother   . Breast cancer Mother 13  . Kidney disease Mother   . Cirrhosis Father   . Alcohol abuse Father     Social History Social History   Tobacco Use  . Smoking status: Former Smoker    Packs/day: 0.25    Years: 16.00    Pack years: 4.00    Types: Cigarettes    Quit date: 11/05/2019    Years since quitting: 0.1  . Smokeless tobacco: Never Used  Vaping Use  . Vaping Use: Every day  . Substances: Nicotine, Flavoring  Substance Use Topics  . Alcohol use: Yes    Comment: Occasional  . Drug use: Never     Allergies   Patient has no known allergies.   Review of Systems Review of Systems  Constitutional: Negative for fever.  Genitourinary: Positive  for flank pain and vaginal discharge.   Physical Exam Triage Vital Signs ED Triage Vitals  Enc Vitals Group     BP 01/05/20 1830 (!) 148/93     Pulse Rate 01/05/20 1830 95     Resp 01/05/20 1830 16     Temp 01/05/20 1830 98.2 F (36.8 C)     Temp Source 01/05/20 1830 Oral     SpO2 01/05/20 1830 100 %     Weight 01/05/20 1832 243 lb (110.2 kg)     Height 01/05/20 1832 5\' 7"  (1.702 m)     Head Circumference --      Peak Flow --      Pain Score 01/05/20 1831 7     Pain Loc --      Pain Edu? --      Excl. in [place]? --    No data found.  Updated Vital Signs BP (!) 148/93 (BP Location: Left Arm)   Pulse 95   Temp 98.2 F (36.8 C) (Oral)   Resp 16   Ht 5\' 7"  (1.702 m)   Wt 110.2 kg   LMP 12/23/2019 (Exact Date)   SpO2 100%   BMI 38.06 kg/m   Visual Acuity Right Eye Distance:   Left Eye Distance:   Bilateral Distance:    Right  Eye Near:   Left Eye Near:    Bilateral Near:     Physical Exam Vitals and nursing note reviewed.  Constitutional:      General: She is not in acute distress.    Appearance: Normal appearance.  HENT:     Head: Normocephalic and atraumatic.  Eyes:     General:        Right eye: No discharge.        Left eye: No discharge.     Conjunctiva/sclera: Conjunctivae normal.  Cardiovascular:     Rate and Rhythm: Normal rate and regular rhythm.  Pulmonary:     Effort: Pulmonary effort is normal.     Breath sounds: Normal breath sounds. No wheezing, rhonchi or rales.  Abdominal:     General: There is no distension.     Palpations: Abdomen is soft.  Neurological:     Mental Status: She is alert.  Psychiatric:        Mood and Affect: Mood normal.        Behavior: Behavior normal.    UC Treatments / Results  Labs (all labs ordered are listed, but only abnormal results are displayed) Labs Reviewed  WET PREP, GENITAL - Abnormal; Notable for the following components:      Result Value   Yeast Wet Prep HPF POC PRESENT (*)    Clue Cells Wet Prep HPF POC PRESENT (*)    WBC, Wet Prep HPF POC FEW (*)    All other components within normal limits  URINALYSIS, COMPLETE (UACMP) WITH MICROSCOPIC - Abnormal; Notable for the following components:   Nitrite POSITIVE (*)    Bacteria, UA MANY (*)    All other components within normal limits  URINE CULTURE    EKG   Radiology No results found.  Procedures Procedures (including critical care time)  Medications Ordered in UC Medications - No data to display  Initial Impression / Assessment and Plan / UC Course  I have reviewed the triage vital signs and the nursing notes.  Pertinent labs & imaging results that were available during my care of the patient were reviewed by me and considered in my medical decision  making (see chart for details).    39 year old female presents with urinary symptoms and vaginal discharge.  Positive nitrite on  urinalysis.  However, there is no pyuria or hematuria.  Sending culture.  Wet prep positive for yeast and for bacterial vaginosis.  Treating with Diflucan and metronidazole.  Awaiting urine culture results.  Final Clinical Impressions(s) / UC Diagnoses   Final diagnoses:  Bacterial vaginosis  Yeast vaginitis   Discharge Instructions   None    ED Prescriptions    Medication Sig Dispense Auth. Provider   metroNIDAZOLE (FLAGYL) 500 MG tablet Take 1 tablet (500 mg total) by mouth 2 (two) times daily. 14 tablet Arsalan Brisbin G, DO   fluconazole (DIFLUCAN) 150 MG tablet Take 1 tablet (150 mg total) by mouth once for 1 dose. Repeat dose in 72 hours. 2 tablet Coral Spikes, DO     PDMP not reviewed this encounter.   Coral Spikes, [place] 01/06/20 1258

## 2020-01-08 ENCOUNTER — Telehealth (HOSPITAL_COMMUNITY): Payer: Self-pay | Admitting: Orthopedic Surgery

## 2020-01-08 LAB — URINE CULTURE: Culture: >=100000 — AB

## 2020-01-08 MED ORDER — NITROFURANTOIN MONOHYD MACRO 100 MG PO CAPS: 100.0000 mg | ORAL_CAPSULE | Freq: Two times a day (BID) | ORAL | 0 refills | Status: DC

## 2020-02-16 ENCOUNTER — Other Ambulatory Visit (INDEPENDENT_AMBULATORY_CARE_PROVIDER_SITE_OTHER): Payer: Self-pay | Admitting: Family Medicine

## 2020-03-14 ENCOUNTER — Other Ambulatory Visit: Payer: Self-pay

## 2020-03-14 ENCOUNTER — Ambulatory Visit (INDEPENDENT_AMBULATORY_CARE_PROVIDER_SITE_OTHER): Payer: BC Managed Care – PPO | Admitting: Obstetrics and Gynecology

## 2020-03-14 ENCOUNTER — Encounter: Payer: Self-pay | Admitting: Obstetrics and Gynecology

## 2020-03-14 VITALS — BP 157/103 | HR 94 | Wt 247.0 lb

## 2020-03-14 DIAGNOSIS — N760 Acute vaginitis: Secondary | ICD-10-CM | POA: Diagnosis not present

## 2020-03-14 DIAGNOSIS — Z113 Encounter for screening for infections with a predominantly sexual mode of transmission: Secondary | ICD-10-CM | POA: Diagnosis not present

## 2020-03-14 DIAGNOSIS — R300 Dysuria: Secondary | ICD-10-CM

## 2020-03-14 DIAGNOSIS — N3000 Acute cystitis without hematuria: Secondary | ICD-10-CM

## 2020-03-14 LAB — POCT URINALYSIS DIPSTICK
Bilirubin, UA: NEGATIVE
Blood, UA: NEGATIVE
Glucose, UA: NEGATIVE
Ketones, UA: NEGATIVE
Leukocytes, UA: NEGATIVE
Nitrite, UA: NEGATIVE
Odor: POSITIVE
Protein, UA: POSITIVE — AB
Spec Grav, UA: 1.02 (ref 1.010–1.025)
Urobilinogen, UA: 2 E.U./dL — AB
pH, UA: 6.5 (ref 5.0–8.0)

## 2020-03-14 MED ORDER — NITROFURANTOIN MONOHYD MACRO 100 MG PO CAPS: 100.0000 mg | ORAL_CAPSULE | Freq: Two times a day (BID) | ORAL | 0 refills | Status: AC

## 2020-03-14 NOTE — Progress Notes (Signed)
Obstetrics & Gynecology Office Visit   Chief Complaint:  Chief Complaint  Patient presents with  . Urinary Tract Infection    lower back pain/pressure upon urinating  . Vaginitis    thick discharge w/o odor x 2 weeks    History of Present Illness: [name] is a 39 y.o. [id number] who LMP was Patient's last menstrual period was 02/22/2020., presents today for a problem visit.  She complains of dysuria and frequency . She has had symptoms for 1 week. Patient also complains of back pain. Symptoms are moderate.  Patient denies vaginal discharge. Patient does have a history of recurrent UTI,  does not have a history of pyelonephritis, does not have a history of nephrolithiasis.  She has not had previous treatment for her current symptoms.   1 week symptoms not self resolving with increased hydration, urinary odor.    Review of Systems: Review of Systems  Constitutional: Negative.   Gastrointestinal: Positive for abdominal pain. Negative for nausea and vomiting.  Genitourinary: Positive for dysuria, frequency and urgency. Negative for flank pain and hematuria.     Past Medical History:  Past Medical History:  Diagnosis Date  . Breast mass   . Bronchitis   . GERD (gastroesophageal reflux disease)    NO MEDS  . Obesity (BMI 30-39.9)     Past Surgical History:  Past Surgical History:  Procedure Laterality Date  . BREAST BIOPSY Left 08/13/2016   Procedure: left BREAST BIOPSY WITH NEEDLE LOCALIZATION left Moeller's gland excision;  Surgeon: Florene Glen, MD;  Location: [facility] ORS;  Service: General;  Laterality: Left;  . CESAREAN SECTION  2007  . TUBAL LIGATION Bilateral 05/25/2018   Procedure: POST PARTUM TUBAL LIGATION;  Surgeon: Homero Fellers, MD;  Location: [facility] ORS;  Service: Gynecology;  Laterality: Bilateral;    Gynecologic History: Patient's last menstrual period was 02/22/2020.  Obstetric History: [id number]  Family History:  Family History    Problem Relation Age of Onset  . Hypertension Mother   . Breast cancer Mother 81  . Kidney disease Mother   . Cirrhosis Father   . Alcohol abuse Father     Social History:  Social History   Socioeconomic History  . Marital status: Married    Spouse name: Not on file  . Number of children: 4  . Years of education: Not on file  . Highest education level: Not on file  Occupational History  . Not on file  Tobacco Use  . Smoking status: Former Smoker    Packs/day: 0.25    Years: 16.00    Pack years: 4.00    Types: Cigarettes    Quit date: 11/05/2019    Years since quitting: 0.3  . Smokeless tobacco: Never Used  Vaping Use  . Vaping Use: Every day  . Substances: Nicotine, Flavoring  Substance and Sexual Activity  . Alcohol use: Yes    Comment: Occasional  . Drug use: Never  . Sexual activity: Yes    Birth control/protection: None    Comment: BTL  Other Topics Concern  . Not on file  Social History Narrative  . Not on file   Social Determinants of Health   Financial Resource Strain:   . Difficulty of Paying Living Expenses: Not on file  Food Insecurity:   . Worried About Charity fundraiser in the Last Year: Not on file  . Ran Out of Food in the Last Year: Not on file  Transportation Needs:   .  Lack of Transportation (Medical): Not on file  . Lack of Transportation (Non-Medical): Not on file  Physical Activity:   . Days of Exercise per Week: Not on file  . Minutes of Exercise per Session: Not on file  Stress:   . Feeling of Stress : Not on file  Social Connections:   . Frequency of Communication with Friends and Family: Not on file  . Frequency of Social Gatherings with Friends and Family: Not on file  . Attends Religious Services: Not on file  . Active Member of Clubs or Organizations: Not on file  . Attends Archivist Meetings: Not on file  . Marital Status: Not on file  Intimate Partner Violence:   . Fear of Current or Ex-Partner: Not on file   . Emotionally Abused: Not on file  . Physically Abused: Not on file  . Sexually Abused: Not on file    Allergies:  No Known Allergies  Medications: Prior to Admission medications   Medication Sig Start Date End Date Taking? Authorizing Provider  nitrofurantoin, macrocrystal-monohydrate, (MACROBID) 100 MG capsule Take 1 capsule (100 mg total) by mouth 2 (two) times daily for 5 days. 03/14/20 03/19/20  Malachy Mood, MD    Physical Exam Vitals:  Vitals:   03/14/20 1534  BP: (!) 157/103  Pulse: 94   Patient's last menstrual period was 02/22/2020.  General: NAD HEENT: normocephalic, anicteric Thyroid: no enlargement, no palpable nodules Pulmonary: No increased work of breathing Cardiovascular: RRR, distal pulses 2+ Abdomen: NABS, soft, non-tender, non-distended.  Umbilicus without lesions.  No hepatomegaly, splenomegaly or masses palpable. No evidence of hernia  Genitourinary:  External: Normal external female genitalia.  Normal urethral meatus, normal  Bartholin's and Skene's glands.    Vagina: Normal vaginal mucosa, no evidence of prolapse.   Extremities: no edema, erythema, or tenderness Neurologic: Grossly intact Psychiatric: mood appropriate, affect full  Female chaperone present for pelvic  portions of the physical exam  Assessment: 39 y.o. [id number] with possible recurrent UTI  Plan: Problem List Items Addressed This Visit    None    Visit Diagnoses    Acute cystitis without hematuria    -  Primary   Relevant Orders   POCT urinalysis dipstick (Completed)   Urine Culture   Routine screening for STI (sexually transmitted infection)       Relevant Orders   Cervicovaginal ancillary only   HEP, RPR, HIV Panel   Dysuria       Relevant Orders   Urine Culture     1) Dysuria - Definition of recurrent urinary tract infections is 2 culture proven episodes in 6 months, or 3 culture proven episodes in 1 year with individual episodes separated by at least two weeks or  documentation of differing urinary tract pathogen.  Relapse of urinary tract infection is symptoms within 2 weeks of adequate treatment, or recurrence of symptoms within 2 weeks and documentation of the same causative urinary tract pathogen. Museum/gallery exhibitions officer Society Best-Practice Statement: Recurrent Urinary Tract Infection in Adult Women" Female Pelvic Medicine & Reconstructive Surgery Volume 24, Number 5, September/October 2018 -Rx macrobid  -UCx discussed if positive may qualify for recurrent UTI's positive E. Coli UTI 12/01/2018, 01/05/2020 - STI testing  2) A total of 15 minutes were spent in face-to-face contact with the patient during this encounter with over half of that time devoted to counseling and coordination of care.  3) Return if symptoms worsen or fail to improve.   Malachy Mood, MD, [facility],  [facility] 03/14/2020, 4:13 PM

## 2020-03-15 LAB — HEP, RPR, HIV PANEL
HIV Screen 4th Generation wRfx: NONREACTIVE
Hepatitis B Surface Ag: NEGATIVE
RPR Ser Ql: NONREACTIVE

## 2020-03-17 LAB — URINE CULTURE

## 2020-03-17 LAB — NUSWAB VAGINITIS PLUS (VG+)
Candida albicans, NAA: NEGATIVE
Candida glabrata, NAA: NEGATIVE
Chlamydia trachomatis, NAA: NEGATIVE
Neisseria gonorrhoeae, NAA: NEGATIVE
Trich vag by NAA: NEGATIVE

## 2020-10-23 ENCOUNTER — Encounter: Payer: Self-pay | Admitting: Obstetrics and Gynecology

## 2020-10-23 ENCOUNTER — Other Ambulatory Visit (HOSPITAL_COMMUNITY)
Admission: RE | Admit: 2020-10-23 | Discharge: 2020-10-23 | Disposition: A | Discharge status: Home / Self Care | Payer: BC Managed Care – PPO | Source: Ambulatory Visit | Attending: Obstetrics and Gynecology | Admitting: Obstetrics and Gynecology

## 2020-10-23 ENCOUNTER — Ambulatory Visit (INDEPENDENT_AMBULATORY_CARE_PROVIDER_SITE_OTHER): Payer: BC Managed Care – PPO | Admitting: Obstetrics and Gynecology

## 2020-10-23 ENCOUNTER — Other Ambulatory Visit: Payer: Self-pay

## 2020-10-23 VITALS — BP 143/93 | HR 89 | Wt 257.0 lb

## 2020-10-23 DIAGNOSIS — N939 Abnormal uterine and vaginal bleeding, unspecified: Secondary | ICD-10-CM | POA: Diagnosis not present

## 2020-10-23 DIAGNOSIS — Z1329 Encounter for screening for other suspected endocrine disorder: Secondary | ICD-10-CM

## 2020-10-23 DIAGNOSIS — R5383 Other fatigue: Secondary | ICD-10-CM | POA: Diagnosis not present

## 2020-10-23 DIAGNOSIS — Z124 Encounter for screening for malignant neoplasm of cervix: Secondary | ICD-10-CM | POA: Insufficient documentation

## 2020-10-23 DIAGNOSIS — R635 Abnormal weight gain: Secondary | ICD-10-CM | POA: Diagnosis not present

## 2020-10-23 DIAGNOSIS — R030 Elevated blood-pressure reading, without diagnosis of hypertension: Secondary | ICD-10-CM

## 2020-10-23 DIAGNOSIS — Z131 Encounter for screening for diabetes mellitus: Secondary | ICD-10-CM

## 2020-10-23 DIAGNOSIS — Z6841 Body Mass Index (BMI) 40.0 and over, adult: Secondary | ICD-10-CM

## 2020-10-23 MED ORDER — PHENTERMINE HCL 37.5 MG PO TABS: 37.5000 mg | ORAL_TABLET | Freq: Every day | ORAL | 0 refills | Status: DC

## 2020-10-23 NOTE — Progress Notes (Signed)
ggggggg    Gynecology Abnormal Uterine Bleeding Initial Evaluation   Chief Complaint:  Chief Complaint  Patient presents with  . Menstrual Problem    Heavy bleeding with clots. Discuss weight loss. RM 2    History of Present Illness:    Paitient is a 40 y.o. [id number] who LMP was Patient's last menstrual period was 10/06/2020., presents today for a problem visit.  She complains of menorrhagia that  began several months ago and its severity is described as moderate.  The patient menstrual complaints are chronic present for the past 6 monthsShe has regular periods every monthand they are associated with moderate menstrual cramping.  The patient is sexually active. She currently uses tubal ligationfor contraception.  Last Pap results esults were obtained 12/01/2017 NIL and HR HPV negative   Previous evaluation: No recent imaging ultrasound 05/18/2018 pregnancy no noted uterine structural abnormalities Previous Treatment: none.  Increased in duration flow 5-7 Increasing volume flow and passing clots.    PCOS: Weight up 20lbs, no hisutism, no oligeomenorrhea or skipped cycles  Thyroid: No temperature intolerance, palpiations, no GI, dry sking  Prolactin: Headaches no changes, no blind spots, no nipple discharge.  Does report polydyspsia   Paramter Normal / Abnormal Prsent  Frequency Amenoorhea     Infrequent (>38 days)     Normal (?24 days ?38 days) X   Freequent (<24 days)    Duration Normal (?8 days) X   Prolonged (>8 days)    Regularity Regular (shortest to longest cycle variation ?7-9 days)* X   Irregular (shortest to longest cycle variation ?8-10days)*    Flow Volume Light    (Self reported) Normal     Heavy X      Intermenstrual Bleeding None X   Random     Cyclical early     Cyclical mid     Cyclical late        Unscheduled Bleeding  Not applicable X  (exogenous hormones) Absent     Present     FIGO AUB I System: *The available evidence suggests that, using these  criteria, the normal range (shortest to longest) varies with age: 12-25 y of age, ?36 d; 27-41 y, ?7 d; and for 37-45 y, ?52 d     Patientis a 40 y.o. [id number] female, who presents for the evaluation of weight gain. She has gained 20 pounds primarily over 12 months. The patient states the following issues have contributed to her weight problem: none identified.  She was successful in loosing weight with assistance of personal trainer but was unable to maintain weight loss.  The patient has no additional symptoms. The patient specifically denies memory loss, muscle weakness, excessive thirst, and polyuria. Weight related co-morbidities include none. The patient's past medical history is notable for non3. She has tried no interventions in the past.  Review of Systems: Review of Systems  Constitutional: Negative.   Eyes: Negative for blurred vision and double vision.  Gastrointestinal: Negative.   Genitourinary: Negative.   Neurological: Negative for headaches.    Past Medical History:  Patient Active Problem List   Diagnosis Date Noted  . Postpartum care following vaginal delivery 07/07/2018  . Encounter for planned induction of labor 05/24/2018  . Anemia of pregnancy 04/07/2018  . Heartburn during pregnancy in second trimester 01/26/2018  . 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 12/21/2017    Labetalol 200mg  BID Daily 81 mg aspirin  [x ] Aspirin 81 mg daily after 12 weeks; discontinue after 36 weeks [x ]  baseline labs with CBC, CMP, urine protein/creatinine ratio [ ]  ultrasound for growth at 28, 32, 36 weeks [ ]  Baseline EKG   Current antihypertensives:  Labetalol   Baseline and surveillance labs (pulled in from [facility], refresh links as needed)  Lab Results  Component Value Date   PLT 270 10/18/2017   CREATININE 0.50 10/18/2017   AST 17 10/18/2017   ALT 15 10/18/2017    Antenatal Testing CHTN - O10.919  Group I  BP < 140/90, no preeclampsia, AGA,  nml AFV, +/- meds     Group II BP > 140/90, on meds, no preeclampsia, AGA, nml AFV  20-28-34-38  20-24-28-32-35-38  32//2 x wk  28//BPP wkly then 32//2 x wk  39 no meds; 39 meds  PRN or 37  Pre-eclampsia  GHTN - O13.9/Preeclampsia without severe features  - O14.00   Preeclampsia with severe features - O14.10  Q 3-4wks  Q 2 wks  28//BPP wkly then 32//2 x wk  Inpatient  36  PRN or 34     . History of cesarean delivery 10/06/2017  . Advanced maternal age in multigravida 10/06/2017  . Supervision of high risk pregnancy, antepartum 09/29/2017    Clinic [facility] Prenatal Labs  Dating  LMP=6week [place] Blood type: A/Positive/-- (03/27 1115)   Genetic Screen NIPS: collected 11/03/17 Antibody:Negative (03/27 1115)  Anatomic [place] Complete 01/26/2018 Rubella: 4.84 (03/27 1115) Varicella: Immune  GTT Early: 109               Third trimester:  RPR: Non Reactive (03/27 1115)   Rhogam  Not needed HBsAg: Negative (03/27 1115)   TDaP vaccine   Flu Shot: declined HIV: Non Reactive (03/27 1115)   Baby Food         Breast                       GBS:   Contraception  Pap:NIL 12/01/2017  CBB     CS/VBAC With G4/VBAC G5   Support Person Michael        . Breast mass, left   . Abnormal mammogram of left breast     Past Surgical History:  Past Surgical History:  Procedure Laterality Date  . BREAST BIOPSY Left 08/13/2016   Procedure: left BREAST BIOPSY WITH NEEDLE LOCALIZATION left Moeller's gland excision;  Surgeon: Florene Glen, MD;  Location: [facility] ORS;  Service: General;  Laterality: Left;  . CESAREAN SECTION  2007  . TUBAL LIGATION Bilateral 05/25/2018   Procedure: POST PARTUM TUBAL LIGATION;  Surgeon: Homero Fellers, MD;  Location: [facility] ORS;  Service: Gynecology;  Laterality: Bilateral;    Obstetric History: [id number]  Family History:  Family History  Problem Relation Age of Onset  . Hypertension Mother   . Breast cancer Mother 41  . Kidney disease Mother   . Cirrhosis Father   . Alcohol  abuse Father     Social History:  Social History   Socioeconomic History  . Marital status: Married    Spouse name: Not on file  . Number of children: 4  . Years of education: Not on file  . Highest education level: Not on file  Occupational History  . Not on file  Tobacco Use  . Smoking status: Former Smoker    Packs/day: 0.25    Years: 16.00    Pack years: 4.00    Types: Cigarettes    Quit date: 11/05/2019    Years since quitting: 0.9  . Smokeless tobacco:  Never Used  Vaping Use  . Vaping Use: Every day  . Substances: Nicotine, Flavoring  Substance and Sexual Activity  . Alcohol use: Yes    Comment: Occasional  . Drug use: Never  . Sexual activity: Yes    Birth control/protection: None    Comment: BTL  Other Topics Concern  . Not on file  Social History Narrative  . Not on file   Social Determinants of Health   Financial Resource Strain: Not on file  Food Insecurity: Not on file  Transportation Needs: Not on file  Physical Activity: Not on file  Stress: Not on file  Social Connections: Not on file  Intimate Partner Violence: Not on file    Allergies:  No Known Allergies  Medications: Prior to Admission medications   Medication Sig Start Date End Date Taking? Authorizing Provider  phentermine (ADIPEX-P) 37.5 MG tablet Take 1 tablet (37.5 mg total) by mouth daily before breakfast. 10/23/20  Yes Malachy Mood, MD    Physical Exam Blood pressure (!) 143/93, pulse 89, weight 257 lb (116.6 kg), last menstrual period 10/06/2020, unknown if currently breastfeeding.  Patient's last menstrual period was 10/06/2020.  General: NAD HEENT: normocephalic, anicteric Pulmonary: No increased work of breathing Abdomen: soft, non-tender, non-distended.  Umbilicus without lesions.   Genitourinary:  External: Normal external female genitalia.  Normal urethral meatus, normal Bartholin's and Skene's glands.    Vagina: Normal vaginal mucosa, no evidence of prolapse.     Cervix: Grossly normal in appearance, no bleeding  Uterus: Non-enlarged, mobile, normal contour.  No CMT  Adnexa: ovaries non-enlarged, no adnexal masses  Rectal: deferred  Lymphatic: no evidence of inguinal lymphadenopathy Extremities: no edema, erythema, or tenderness Neurologic: Grossly intact Psychiatric: mood appropriate, affect full  Female chaperone present for pelvic portions of the physical exam  Assessment: 40 y.o. [id number] with abnormal uterine bleeding  Plan: Problem List Items Addressed This Visit   None   Visit Diagnoses    Abnormal uterine bleeding    -  Primary   Relevant Orders   TSH   Testosterone,Free and Total   Prolactin   FSH/LH   Androstenedione   17-Hydroxyprogesterone   DHEA-sulfate   US Transvaginal Non-OB   Fatigue, unspecified type       Relevant Orders   TSH   Weight gain       Relevant Orders   TSH   Testosterone,Free and Total   Prolactin   FSH/LH   Androstenedione   17-Hydroxyprogesterone   DHEA-sulfate   Hemoglobin A1c   Class 3 severe obesity with serious comorbidity and body mass index (BMI) of 40.0 to 44.9 in adult, unspecified obesity type (HCC)       Relevant Medications   phentermine (ADIPEX-P) 37.5 MG tablet   Other Relevant Orders   TSH   Testosterone,Free and Total   Prolactin   FSH/LH   Androstenedione   17-Hydroxyprogesterone   DHEA-sulfate   Hemoglobin A1c   Elevated BP without diagnosis of hypertension       Relevant Orders   TSH   Thyroid disorder screening       Relevant Orders   TSH   Screening for diabetes mellitus       Relevant Orders   Hemoglobin A1c   Cervical cancer screening       Relevant Orders   Cytology - PAP     AUB  1) Discussed management options for abnormal uterine bleeding including expectant, NSAIDs, tranexamic acid (Lysteda), oral progesterone (Provera, norethindrone, megace),  Depo Provera, Levonorgestrel containing IUD, endometrial ablation (Novasure) or hysterectomy as  definitive surgical management.  Discussed risks and benefits of each method.   Final management decision will hinge on results of patient's work up and whether an underlying etiology for the patients bleeding symptoms can be discerned.  We will conduct a basic work up examining using the PALM-COIEN classification system.  The role of unopposed estrogen in the development of endometrial hyperplasia or carcinoma is discussed.  The risk of endometrial hyperplasia is linearly correlated with increasing BMI given the production of estrone by adipose tissue. Printed patient education handouts were given to the patient to review at home.  Bleeding precautions reviewed.   2) Return in about 2 weeks (around 11/06/2020) for Medication follow up BP check.   Obesity  1) 1500 Calorie ADA Diet  2) Patient education given regarding appropriate lifestyle changes for weight loss including: regular physical activity, healthy coping strategies, caloric restriction and healthy eating patterns.  3) Patient will be started on weight loss medication. The risks and benefits and side effects of medication, such as Adipex (Phenteramine) ,  Tenuate (Diethylproprion), Belviq (lorcarsin), Contrave (buproprion/naltrexone), Qsymia (phentermine/topiramate), and Saxenda (liraglutide) is discussed. The pros and cons of suppressing appetite and boosting metabolism is discussed. Risks of tolerence and addiction is discussed for selected agents discussed. Use of medicine will ne short term, such as 3-4 months at a time followed by a period of time off of the medicine to avoid these risks and side effects for Adipex, Qsymia, and Tenuate discussed. Pt to call with any negative side effects and agrees to keep follow up appts.  4) Comorbidity Screening - hypothyroidism screening, diabetes, and hyperlipidemia screening offered  5) Contraception - discussed that all weight loss drugs fall in to pregnancy category X, patient currently has  reliable contraception in the form of tubal ligation  7) 15 minutes face-to-face; counseling/coordination of care > 50 percent of visit.  We discussed that if work up otherwise negative weight fluctuation may also impact menstrua cycles with weight loss generally associated with an improvement in flow  8) Follow up in 4 weeks to assess response   Malachy Mood, MD, [place] 10/23/2020, 4:59 PM

## 2020-10-23 NOTE — Progress Notes (Signed)
Duplicate

## 2020-10-28 LAB — CYTOLOGY - PAP
Adequacy: ABSENT
Comment: NEGATIVE
Diagnosis: NEGATIVE
Diagnosis: REACTIVE
High risk HPV: NEGATIVE

## 2020-11-04 LAB — 17-HYDROXYPROGESTERONE: 17-Hydroxyprogesterone: 127 ng/dL

## 2020-11-04 LAB — TESTOSTERONE,FREE AND TOTAL
Testosterone, Free: 0.5 pg/mL (ref 0.0–4.2)
Testosterone: <3 ng/dL — ABNORMAL LOW (ref 8–60)

## 2020-11-04 LAB — TSH: TSH: 1.18 u[IU]/mL (ref 0.450–4.500)

## 2020-11-04 LAB — ANDROSTENEDIONE: Androstenedione: 75 ng/dL (ref 41–262)

## 2020-11-04 LAB — PROLACTIN: Prolactin: 20.6 ng/mL (ref 4.8–23.3)

## 2020-11-04 LAB — HEMOGLOBIN A1C
Est. average glucose Bld gHb Est-mCnc: 111 mg/dL
Hgb A1c MFr Bld: 5.5 % (ref 4.8–5.6)

## 2020-11-04 LAB — DHEA-SULFATE: DHEA-SO4: 42.5 ug/dL — ABNORMAL LOW (ref 57.3–279.2)

## 2020-11-04 LAB — FSH/LH
FSH: 4 m[IU]/mL
LH: 8.2 m[IU]/mL

## 2020-11-21 ENCOUNTER — Ambulatory Visit (INDEPENDENT_AMBULATORY_CARE_PROVIDER_SITE_OTHER): Payer: BC Managed Care – PPO | Admitting: Obstetrics and Gynecology

## 2020-11-21 ENCOUNTER — Encounter: Payer: Self-pay | Admitting: Obstetrics and Gynecology

## 2020-11-21 ENCOUNTER — Other Ambulatory Visit: Payer: Self-pay

## 2020-11-21 DIAGNOSIS — I10 Essential (primary) hypertension: Secondary | ICD-10-CM | POA: Diagnosis not present

## 2020-11-21 DIAGNOSIS — E66813 Obesity, class 3: Secondary | ICD-10-CM

## 2020-11-21 DIAGNOSIS — R935 Abnormal findings on diagnostic imaging of other abdominal regions, including retroperitoneum: Secondary | ICD-10-CM | POA: Diagnosis not present

## 2020-11-21 DIAGNOSIS — N939 Abnormal uterine and vaginal bleeding, unspecified: Secondary | ICD-10-CM

## 2020-11-21 DIAGNOSIS — Z6841 Body Mass Index (BMI) 40.0 and over, adult: Secondary | ICD-10-CM

## 2020-11-21 NOTE — Progress Notes (Signed)
Gynecology Office Visit  Chief Complaint:  Chief Complaint  Patient presents with  . Follow-up    Weight loss, BP check , F/U ultrasound - questions about ultrasound. RM 2    History of Present Illness: Patientis a 39 y.o. [id number] female, who presents for the evaluation of the desire to lose weight. She has lost 7 pounds 1 months. The patient states the following symptoms since starting her weight loss therapy: appetite suppression, energy, and weight loss.  The patient also reports no other ill effects. The patient specifically denies heart palpitations, anxiety, and insomnia.   TVUS for AUB ultrasound reviewed today showing focal thickening of endometrial lining concerning for endometrial polyp.   Review of Systems: 10 point review of systems negative unless otherwise noted in HPI  Past Medical History:  Past Medical History:  Diagnosis Date  . Breast mass   . Bronchitis   . GERD (gastroesophageal reflux disease)    NO MEDS  . Obesity (BMI 30-39.9)     Past Surgical History:  Past Surgical History:  Procedure Laterality Date  . BREAST BIOPSY Left 08/13/2016   Procedure: left BREAST BIOPSY WITH NEEDLE LOCALIZATION left Moeller's gland excision;  Surgeon: Florene Glen, MD;  Location: [facility] ORS;  Service: General;  Laterality: Left;  . CESAREAN SECTION  2007  . TUBAL LIGATION Bilateral 05/25/2018   Procedure: POST PARTUM TUBAL LIGATION;  Surgeon: Homero Fellers, MD;  Location: [facility] ORS;  Service: Gynecology;  Laterality: Bilateral;    Gynecologic History: Patient's last menstrual period was 11/04/2020.  Obstetric History: [id number]  Family History:  Family History  Problem Relation Age of Onset  . Hypertension Mother   . Breast cancer Mother 63  . Kidney disease Mother   . Cirrhosis Father   . Alcohol abuse Father     Social History:  Social History   Socioeconomic History  . Marital status: Married    Spouse name: Not on file  . Number of  children: 4  . Years of education: Not on file  . Highest education level: Not on file  Occupational History  . Not on file  Tobacco Use  . Smoking status: Former Smoker    Packs/day: 0.25    Years: 16.00    Pack years: 4.00    Types: Cigarettes    Quit date: 11/05/2019    Years since quitting: 1.0  . Smokeless tobacco: Never Used  Vaping Use  . Vaping Use: Every day  . Substances: Nicotine, Flavoring  Substance and Sexual Activity  . Alcohol use: Yes    Comment: Occasional  . Drug use: Never  . Sexual activity: Yes    Birth control/protection: None    Comment: BTL  Other Topics Concern  . Not on file  Social History Narrative  . Not on file   Social Determinants of Health   Financial Resource Strain: Not on file  Food Insecurity: Not on file  Transportation Needs: Not on file  Physical Activity: Not on file  Stress: Not on file  Social Connections: Not on file  Intimate Partner Violence: Not on file    Allergies:  No Known Allergies  Medications: Prior to Admission medications   Medication Sig Start Date End Date Taking? Authorizing Provider  phentermine (ADIPEX-P) 37.5 MG tablet Take 1 tablet (37.5 mg total) by mouth daily before breakfast. 10/23/20  Yes Malachy Mood, MD    Physical Exam Blood pressure (!) 172/132, height 5\' 7"  (1.702 m), weight 250 lb (  113.4 kg), last menstrual period 11/04/2020, unknown if currently breastfeeding. Wt Readings from Last 3 Encounters:  11/21/20 250 lb (113.4 kg)  10/23/20 257 lb (116.6 kg)  03/14/20 247 lb (112 kg)    General: NAD HEENT: normocephalic, anicteric Thyroid: no enlargement Pulmonary: no increased work of breathing Neurologic: Grossly intact Psychiatric: mood appropriate, affect full  TVUS [facility] 11/13/2020 CLINICAL INDICATION: 40 years old Female with Abnormal uterine bleeding (AUB) - N93.9 - Abnormal uterine bleeding (AUB)    TECHNIQUE: Ultrasound views of the pelvis were obtained  endovaginally and transabdominally using gray scale and color Doppler imaging. Spectral Doppler imaging was also performed.   COMPARISON: None.   FINDINGS:   UTERUS/CERVIX: The uterus is anteverted with heterogeneous echotexture. No focal myometrial mass was seen. Focal endometrial thickening in the fundus measuring up to 1 cm with increased vascularity and scattered microcalcifications. Subcentimeter subendometrial cyst identified. Nabothian cysts.   OVARIES: The ovaries were seen well transvaginally. Small cystic areas were seen within both ovaries compatible with follicles. Appropriate arterial inflow and venous outflow of the ovaries was documented on color and spectral Doppler imaging. There is a cystic structure adjacent to the right ovary likely represents a mildly dilated right fallopian tube.   OTHER: Trace pelvic free fluid.   IMPRESSION:   1.Focal endometrial thickening, increased vascularity, and scattered microcalcifications. Consider correlation with endometrial biopsy. Repeat pelvic endovaginal ultrasound is recommended in 6-12 weeks to reevaluate the endometrium and ensure an underlying endometrial polyp is not present.  2.Focal subendometrial cyst, may be seen in the setting of focal adenomyosis.  3.Mild right hydrosalpinx, indeterminate.    Immunization History  Administered Date(s) Administered  . Tdap 03/23/2018    Assessment: 40 y.o. [id number] follow up medical weight loss  Plan: Problem List Items Addressed This Visit   None   Visit Diagnoses    Class 3 severe obesity with serious comorbidity and body mass index (BMI) of 40.0 to 44.9 in adult, unspecified obesity type ([place])    -  Primary   Essential hypertension          1) 1500 Calorie ADA Diet  2) Patient education given regarding appropriate lifestyle changes for weight loss including: regular physical activity, healthy coping strategies, caloric restriction and healthy eating patterns.  3) Patient will  be started on weight loss medication. The risks and benefits and side effects of medication, such as Adipex (Phenteramine) ,  Tenuate (Diethylproprion), Belviq (lorcarsin), Contrave (buproprion/naltrexone), Qsymia (phentermine/topiramate), and Saxenda (liraglutide) is discussed. The pros and cons of suppressing appetite and boosting metabolism is discussed. Risks of tolerence and addiction is discussed for selected agents discussed. Use of medicine will ne short term, such as 3-4 months at a time followed by a period of time off of the medicine to avoid these risks and side effects for Adipex, Qsymia, and Tenuate discussed. Pt to call with any negative side effects and agrees to keep follow up appts. - Given HTN discontinue phentermine - Saxenda Rx written  4) Patient to take medication, with the benefits of appetite suppression and metabolism boost d/w pt, along with the side effects and risk factors of long term use that will be avoided with our use of short bursts of therapy. Rx provided.    5) 15 minutes face-to-face; with counseling/coordination of care > 50 percent of visit related to obesity and ongoing management/treatment   6)  AUB - [place] [facility].   Post hysteroscopy, D&C +/- Mirena following ultrasound results  7) No  follow-ups on file.    Malachy Mood, MD, [facility], [facility] 11/21/2020, 11:48 AM

## 2020-12-10 ENCOUNTER — Telehealth: Payer: Self-pay

## 2020-12-10 NOTE — Telephone Encounter (Signed)
-----   Message from [place] Lodge sent at 11/28/2020 10:17 AM EDT ----- Regarding: FW: Surgery  ----- Message ----- From: Malachy Mood, MD Sent: 11/27/2020   8:11 PM EDT To: [place] Lodge Subject: Surgery                                        Surgery Booking Request Patient Full Name:  Danielle Romero  MRN: [id number]  DOB: 03-21-1981  Surgeon: Malachy Mood, MD  Requested Surgery Date and Time: 2-6 week Primary Diagnosis AND Code: Abnormal uterine bleeding Secondary Diagnosis and Code: Abnormal appearance endometrium Surgical Procedure: Hysteroscopy D&C +/- Mirena IUD RNFA Requested?: No L&D Notification: No Admission Status: same day surgery Length of Surgery: 50 min Special Case Needs: No H&P: Yes Phone Interview???:  No Interpreter: No Medical Clearance:  No Special Scheduling Instructions: No Any known health/anesthesia issues, diabetes, sleep apnea, latex allergy, defibrillator/pacemaker?: Yes HTN Acuity: P2   (P1 highest, P2 delay may cause harm, P3 low, elective gyn, P4 lowest)

## 2020-12-10 NOTE — Telephone Encounter (Signed)
Called patient to schedule hysteroscopy D&C and possible Mirena IUD insertion w [facility]  DOS 6/16  H&P 6/8 @ 10:10   Pre-admit phone call appointment to be requested - date and time will be included on H&P paper work. Also all appointments will be updated on pt MyChart. Explained that this appointment has a call window. Based on the time scheduled will indicate if the call will be received within a 4 hour window before 1:00 or after.  Advised that pt may also receive calls from the hospital pharmacy and pre-service center.  Confirmed pt has BCBS as Chartered certified accountant. No secondary insurance.  Patient asked about Rx that was to be sent to her pharmacy, [facility]. She could not remember the name but thinks it started with an S. I advised I would ask and that someone would let her know the status of it.

## 2020-12-10 NOTE — Telephone Encounter (Signed)
Left a message for the patient to return the call.  

## 2020-12-11 ENCOUNTER — Other Ambulatory Visit: Payer: Self-pay | Admitting: Obstetrics and Gynecology

## 2020-12-11 MED ORDER — PHENTERMINE HCL 37.5 MG PO TABS: 37.5000 mg | ORAL_TABLET | Freq: Every day | ORAL | 0 refills | Status: DC

## 2020-12-11 NOTE — Telephone Encounter (Signed)
Rx sent 

## 2020-12-18 ENCOUNTER — Ambulatory Visit (INDEPENDENT_AMBULATORY_CARE_PROVIDER_SITE_OTHER): Payer: BC Managed Care – PPO | Admitting: Obstetrics and Gynecology

## 2020-12-18 ENCOUNTER — Other Ambulatory Visit: Payer: Self-pay

## 2020-12-18 ENCOUNTER — Encounter: Payer: Self-pay | Admitting: Obstetrics and Gynecology

## 2020-12-18 VITALS — BP 168/102 | Wt 254.0 lb

## 2020-12-18 DIAGNOSIS — R935 Abnormal findings on diagnostic imaging of other abdominal regions, including retroperitoneum: Secondary | ICD-10-CM

## 2020-12-18 DIAGNOSIS — N939 Abnormal uterine and vaginal bleeding, unspecified: Secondary | ICD-10-CM

## 2020-12-18 MED ORDER — SAXENDA 18 MG/3ML ~~LOC~~ SOPN: PEN_INJECTOR | SUBCUTANEOUS | 2 refills | Status: DC

## 2020-12-18 MED ORDER — NOVOFINE PEN NEEDLE 32G X 6 MM MISC: 1.0000 [IU] | Freq: Every day | 2 refills | Status: DC

## 2020-12-18 NOTE — H&P (View-Only) (Signed)
Obstetrics & Gynecology Surgery H&P    Chief Complaint: Scheduled Surgery   History of Present Illness: Patient is a 40 y.o. [id number] presenting for scheduled hysteroscopy, dilation and curettage, for the treatment or further evaluation of abnormal uterine bleeding and focally thickened endometrial lining.   Prior Treatments prior to proceeding with surgery include: ultrasound  Preoperative Pap: 10/23/2020 NILM HPV negative Preoperative Endometrial biopsy: N/A focal lesion Preoperative Ultrasound: 11/13/2020 focal endometrial thickening 1.1cm and subendometrial cysts suggestive of adenomyosis.  Mild right hydrosalpinx   Review of Systems:10 point review of systems  Past Medical History:  Patient Active Problem List   Diagnosis Date Noted  . Postpartum care following vaginal delivery 07/07/2018  . Encounter for planned induction of labor 05/24/2018  . Anemia of pregnancy 04/07/2018  . Heartburn during pregnancy in second trimester 01/26/2018  . 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 12/21/2017    Labetalol 200mg  BID Daily 81 mg aspirin  [x ] Aspirin 81 mg daily after 12 weeks; discontinue after 36 weeks [x ] baseline labs with CBC, CMP, urine protein/creatinine ratio [ ]  ultrasound for growth at 28, 32, 36 weeks [ ]  Baseline EKG   Current antihypertensives:  Labetalol   Baseline and surveillance labs (pulled in from [facility], refresh links as needed)  Lab Results  Component Value Date   PLT 270 10/18/2017   CREATININE 0.50 10/18/2017   AST 17 10/18/2017   ALT 15 10/18/2017    Antenatal Testing CHTN - O10.919  Group I  BP < 140/90, no preeclampsia, AGA,  nml AFV, +/- meds    Group II BP > 140/90, on meds, no preeclampsia, AGA, nml AFV  20-28-34-38  20-24-28-32-35-38  32//2 x wk  28//BPP wkly then 32//2 x wk  39 no meds; 39 meds  PRN or 37  Pre-eclampsia  GHTN - O13.9/Preeclampsia without severe features  - O14.00   Preeclampsia with severe features -  O14.10  Q 3-4wks  Q 2 wks  28//BPP wkly then 32//2 x wk  Inpatient  36  PRN or 34     . History of cesarean delivery 10/06/2017  . Advanced maternal age in multigravida 10/06/2017  . Supervision of high risk pregnancy, antepartum 09/29/2017    Clinic [facility] Prenatal Labs  Dating  LMP=6week [place] Blood type: A/Positive/-- (03/27 1115)   Genetic Screen NIPS: collected 11/03/17 Antibody:Negative (03/27 1115)  Anatomic [place] Complete 01/26/2018 Rubella: 4.84 (03/27 1115) Varicella: Immune  GTT Early: 109               Third trimester:  RPR: Non Reactive (03/27 1115)   Rhogam  Not needed HBsAg: Negative (03/27 1115)   TDaP vaccine   Flu Shot: declined HIV: Non Reactive (03/27 1115)   Baby Food         Breast                       GBS:   Contraception  Pap:NIL 12/01/2017  CBB     CS/VBAC With G4/VBAC G5   Support Person Michael        . Breast mass, left   . Abnormal mammogram of left breast     Past Surgical History:  Past Surgical History:  Procedure Laterality Date  . BREAST BIOPSY Left 08/13/2016   Procedure: left BREAST BIOPSY WITH NEEDLE LOCALIZATION left Moeller's gland excision;  Surgeon: Florene Glen, MD;  Location: [facility] ORS;  Service: General;  Laterality: Left;  . CESAREAN SECTION  2007  .  TUBAL LIGATION Bilateral 05/25/2018   Procedure: POST PARTUM TUBAL LIGATION;  Surgeon: Homero Fellers, MD;  Location: [facility] ORS;  Service: Gynecology;  Laterality: Bilateral;    Family History:  Family History  Problem Relation Age of Onset  . Hypertension Mother   . Breast cancer Mother 35  . Kidney disease Mother   . Cirrhosis Father   . Alcohol abuse Father     Social History:  Social History   Socioeconomic History  . Marital status: Married    Spouse name: Not on file  . Number of children: 4  . Years of education: Not on file  . Highest education level: Not on file  Occupational History  . Not on file  Tobacco Use  . Smoking status: Former Smoker     Packs/day: 0.25    Years: 16.00    Pack years: 4.00    Types: Cigarettes    Quit date: 11/05/2019    Years since quitting: 1.1  . Smokeless tobacco: Never Used  Vaping Use  . Vaping Use: Every day  . Substances: Nicotine, Flavoring  Substance and Sexual Activity  . Alcohol use: Yes    Comment: Occasional  . Drug use: Never  . Sexual activity: Yes    Birth control/protection: None    Comment: BTL  Other Topics Concern  . Not on file  Social History Narrative  . Not on file   Social Determinants of Health   Financial Resource Strain: Not on file  Food Insecurity: Not on file  Transportation Needs: Not on file  Physical Activity: Not on file  Stress: Not on file  Social Connections: Not on file  Intimate Partner Violence: Not on file    Allergies:  No Known Allergies  Medications: Prior to Admission medications   Medication Sig Start Date End Date Taking? Authorizing Provider  phentermine (ADIPEX-P) 37.5 MG tablet Take 1 tablet (37.5 mg total) by mouth daily before breakfast. Patient not taking: Reported on 12/12/2020 12/11/20   Malachy Mood, MD    Physical Exam Vitals: Blood pressure (!) 168/102, weight 254 lb (115.2 kg), last menstrual period 12/03/2020, unknown if currently breastfeeding. General: NAD HEENT: normocephalic, anicteric Pulmonary: No increased work of breathing Cardiovascular: RRR, distal pulses 2+ Extremities: no edema, erythema, or tenderness Neurologic: Grossly intact Psychiatric: mood appropriate, affect full  Imaging No results found.  Assessment: 40 y.o. [id number] presenting for scheduled hysteroscopy, D&C, and Mirena IUD placement  Plan: 1) Patient opts for definitive surgical management via hysterectomy. The risks of surgery were discussed in detail with the patient including but not limited to: bleeding which may require transfusion or reoperation; infection which may require antibiotics; injury to bowel, bladder, ureters or other  surrounding organs (With a literature reported rate of urinary tract injury of 1% quoted); need for additional procedures including laparotomy; thromboembolic phenomenon, incisional problems and other postoperative/anesthesia complications.  Patient was also advised that recovery procedure generally involves an overnight stay; and the  expected recovery time after a hysterectomy being in the range of 6-8 weeks.  Likelihood of success in alleviating the patient's symptoms was discussed.  While definitive in regards to issues with menstural bleeding, pelvic pain if present preoperatively may continue and in fact worsen postoperatively.  She is aware that the procedure will render her unable to pursue childbearing in the future.   She was told that she will be contacted by our surgical scheduler regarding the time and date of her surgery; routine preoperative instructions of having nothing  to eat or drink after midnight on the day prior to surgery and also coming to the hospital 1.5 hours prior to her time of surgery were also emphasized.  She was told she may be called for a preoperative appointment about a week prior to surgery and will be given further preoperative instructions at that visit.  Routine postoperative instructions will be reviewed with the patient and her family in detail after surgery. Printed patient education handouts about the procedure was given to the patient to review at home.   2) Routine postoperative instructions were reviewed with the patient and her family in detail today including the expected length of recovery and likely postoperative course.  The patient concurred with the proposed plan, giving informed written consent for the surgery today.  Patient instructed on the importance of being NPO after midnight prior to her procedure.  If warranted preoperative prophylactic antibiotics and SCDs ordered on call to the OR to meet SCIP guidelines and adhere to recommendation laid forth in  [place] Number 104 May 2009  "Antibiotic Prophylaxis for Gynecologic Procedures".     Malachy Mood, MD, [facility], [facility] 12/18/2020, 10:57 AM

## 2020-12-18 NOTE — Progress Notes (Signed)
Obstetrics & Gynecology Surgery H&P    Chief Complaint: Scheduled Surgery   History of Present Illness: Patient is a 40 y.o. [id number] presenting for scheduled hysteroscopy, dilation and curettage, for the treatment or further evaluation of abnormal uterine bleeding and focally thickened endometrial lining.   Prior Treatments prior to proceeding with surgery include: ultrasound  Preoperative Pap: 10/23/2020 NILM HPV negative Preoperative Endometrial biopsy: N/A focal lesion Preoperative Ultrasound: 11/13/2020 focal endometrial thickening 1.1cm and subendometrial cysts suggestive of adenomyosis.  Mild right hydrosalpinx   Review of Systems:10 point review of systems  Past Medical History:  Patient Active Problem List   Diagnosis Date Noted  . Postpartum care following vaginal delivery 07/07/2018  . Encounter for planned induction of labor 05/24/2018  . Anemia of pregnancy 04/07/2018  . Heartburn during pregnancy in second trimester 01/26/2018  . 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 12/21/2017    Labetalol 200mg  BID Daily 81 mg aspirin  [x ] Aspirin 81 mg daily after 12 weeks; discontinue after 36 weeks [x ] baseline labs with CBC, CMP, urine protein/creatinine ratio [ ]  ultrasound for growth at 28, 32, 36 weeks [ ]  Baseline EKG   Current antihypertensives:  Labetalol   Baseline and surveillance labs (pulled in from [facility], refresh links as needed)  Lab Results  Component Value Date   PLT 270 10/18/2017   CREATININE 0.50 10/18/2017   AST 17 10/18/2017   ALT 15 10/18/2017    Antenatal Testing CHTN - O10.919  Group I  BP < 140/90, no preeclampsia, AGA,  nml AFV, +/- meds    Group II BP > 140/90, on meds, no preeclampsia, AGA, nml AFV  20-28-34-38  20-24-28-32-35-38  32//2 x wk  28//BPP wkly then 32//2 x wk  40 no meds; 39 meds  PRN or 37  Pre-eclampsia  GHTN - O13.9/Preeclampsia without severe features  - O14.00   Preeclampsia with severe features -  O14.10  Q 3-4wks  Q 2 wks  28//BPP wkly then 32//2 x wk  Inpatient  37  PRN or 34     . History of cesarean delivery 10/06/2017  . Advanced maternal age in multigravida 10/06/2017  . Supervision of high risk pregnancy, antepartum 09/29/2017    Clinic [facility] Prenatal Labs  Dating  LMP=6week [place] Blood type: A/Positive/-- (03/27 1115)   Genetic Screen NIPS: collected 11/03/17 Antibody:Negative (03/27 1115)  Anatomic [place] Complete 01/26/2018 Rubella: 4.84 (03/27 1115) Varicella: Immune  GTT Early: 109               Third trimester:  RPR: Non Reactive (03/27 1115)   Rhogam  Not needed HBsAg: Negative (03/27 1115)   TDaP vaccine   Flu Shot: declined HIV: Non Reactive (03/27 1115)   Baby Food         Breast                       GBS:   Contraception  Pap:NIL 12/01/2017  CBB     CS/VBAC With G4/VBAC G5   Support Person Michael        . Breast mass, left   . Abnormal mammogram of left breast     Past Surgical History:  Past Surgical History:  Procedure Laterality Date  . BREAST BIOPSY Left 08/13/2016   Procedure: left BREAST BIOPSY WITH NEEDLE LOCALIZATION left Moeller's gland excision;  Surgeon: Florene Glen, MD;  Location: [facility] ORS;  Service: General;  Laterality: Left;  . CESAREAN SECTION  2007  .  TUBAL LIGATION Bilateral 05/25/2018   Procedure: POST PARTUM TUBAL LIGATION;  Surgeon: Homero Fellers, MD;  Location: [facility] ORS;  Service: Gynecology;  Laterality: Bilateral;    Family History:  Family History  Problem Relation Age of Onset  . Hypertension Mother   . Breast cancer Mother 52  . Kidney disease Mother   . Cirrhosis Father   . Alcohol abuse Father     Social History:  Social History   Socioeconomic History  . Marital status: Married    Spouse name: Not on file  . Number of children: 4  . Years of education: Not on file  . Highest education level: Not on file  Occupational History  . Not on file  Tobacco Use  . Smoking status: Former Smoker     Packs/day: 0.25    Years: 16.00    Pack years: 4.00    Types: Cigarettes    Quit date: 11/05/2019    Years since quitting: 1.1  . Smokeless tobacco: Never Used  Vaping Use  . Vaping Use: Every day  . Substances: Nicotine, Flavoring  Substance and Sexual Activity  . Alcohol use: Yes    Comment: Occasional  . Drug use: Never  . Sexual activity: Yes    Birth control/protection: None    Comment: BTL  Other Topics Concern  . Not on file  Social History Narrative  . Not on file   Social Determinants of Health   Financial Resource Strain: Not on file  Food Insecurity: Not on file  Transportation Needs: Not on file  Physical Activity: Not on file  Stress: Not on file  Social Connections: Not on file  Intimate Partner Violence: Not on file    Allergies:  No Known Allergies  Medications: Prior to Admission medications   Medication Sig Start Date End Date Taking? Authorizing Provider  phentermine (ADIPEX-P) 37.5 MG tablet Take 1 tablet (37.5 mg total) by mouth daily before breakfast. Patient not taking: Reported on 12/12/2020 12/11/20   Malachy Mood, MD    Physical Exam Vitals: Blood pressure (!) 168/102, weight 254 lb (115.2 kg), last menstrual period 12/03/2020, unknown if currently breastfeeding. General: NAD HEENT: normocephalic, anicteric Pulmonary: No increased work of breathing Cardiovascular: RRR, distal pulses 2+ Extremities: no edema, erythema, or tenderness Neurologic: Grossly intact Psychiatric: mood appropriate, affect full  Imaging No results found.  Assessment: 40 y.o. [id number] presenting for scheduled hysteroscopy, D&C, and Mirena IUD placement  Plan: 1) Patient opts for definitive surgical management via hysterectomy. The risks of surgery were discussed in detail with the patient including but not limited to: bleeding which may require transfusion or reoperation; infection which may require antibiotics; injury to bowel, bladder, ureters or other  surrounding organs (With a literature reported rate of urinary tract injury of 1% quoted); need for additional procedures including laparotomy; thromboembolic phenomenon, incisional problems and other postoperative/anesthesia complications.  Patient was also advised that recovery procedure generally involves an overnight stay; and the  expected recovery time after a hysterectomy being in the range of 6-8 weeks.  Likelihood of success in alleviating the patient's symptoms was discussed.  While definitive in regards to issues with menstural bleeding, pelvic pain if present preoperatively may continue and in fact worsen postoperatively.  She is aware that the procedure will render her unable to pursue childbearing in the future.   She was told that she will be contacted by our surgical scheduler regarding the time and date of her surgery; routine preoperative instructions of having nothing  to eat or drink after midnight on the day prior to surgery and also coming to the hospital 1.5 hours prior to her time of surgery were also emphasized.  She was told she may be called for a preoperative appointment about a week prior to surgery and will be given further preoperative instructions at that visit.  Routine postoperative instructions will be reviewed with the patient and her family in detail after surgery. Printed patient education handouts about the procedure was given to the patient to review at home.   2) Routine postoperative instructions were reviewed with the patient and her family in detail today including the expected length of recovery and likely postoperative course.  The patient concurred with the proposed plan, giving informed written consent for the surgery today.  Patient instructed on the importance of being NPO after midnight prior to her procedure.  If warranted preoperative prophylactic antibiotics and SCDs ordered on call to the OR to meet SCIP guidelines and adhere to recommendation laid forth in  [place] Number 104 May 2009  "Antibiotic Prophylaxis for Gynecologic Procedures".     Malachy Mood, MD, [facility], [facility] 12/18/2020, 10:57 AM

## 2020-12-19 ENCOUNTER — Other Ambulatory Visit: Payer: Self-pay

## 2020-12-19 ENCOUNTER — Encounter
Admission: RE | Admit: 2020-12-19 | Discharge: 2020-12-19 | Disposition: A | Discharge status: Home / Self Care | Payer: BC Managed Care – PPO | Source: Ambulatory Visit | Attending: Obstetrics and Gynecology | Admitting: Obstetrics and Gynecology

## 2020-12-19 HISTORY — DX: Essential (primary) hypertension: I10

## 2020-12-19 NOTE — Patient Instructions (Addendum)
Your procedure is scheduled on: Thursday, June 16TH  To find out your arrival time please call [phone]               between 1PM - 3PM on Wednesday, June 15TH  Remember: Instructions that are not followed completely may result in serious medical risk,  up to and including death, or upon the discretion of your surgeon and anesthesiologist your  surgery may need to be rescheduled.     _X__ 1. Do not eat food after midnight the night before your procedure.                 No chewing gum, hard candies, TIC TACS, lozengers or food.                 You may drink clear liquids up to 2 hours before you are scheduled to arrive for your                  surgery- DO not drink clear liquids within 2 hours of the start of your surgery.                 Clear Liquids include:  water, apple juice without pulp, clear Gatorade, G2 or                  Gatorade Zero (avoid Red/Purple/Blue), Black Coffee or Tea (You may add                 Sweetners but no milk or cream).  __X__2.  On the morning of surgery brush your teeth with toothpaste and water.                              Do not swallow any toothpaste of mouthwash.     _X__ 3.  No Alcohol for 24 hours before or after surgery.   _X__ 4.  Do Not Smoke or use e-cigarettes For 24 Hours Prior to Your Surgery.                 Do not use any chewable tobacco products for at least 6 hours prior to  ____  6.  Bring all medications with you on the day of surgery if instructed.   __x__  7.  Notify your doctor if there is any change in your medical condition      (cold, fever, infections).  The night before surgery and again on the morning of surgery, please shower with   An ANTIBACTERIAL soap.  Once you have done this, please do not put anything on  Your skin. This includes creams, lotions, powder or perfume. You may wear deodorant.      Do not wear jewelry, make-up, hairpins, clips or nail polish.    NO METAL ON YOUR  BODY. Do not shave 48 hours prior to surgery.  Do not bring valuables to the hospital.    [facility] is not responsible for any belongings or valuables.  Contacts, dentures or bridgework may not be worn into surgery. They will be removed And placed in the Recovery Room so that you may get them as soon as you wake up.    Patients discharged the day of surgery will not be allowed to drive home.   Make arrangements for someone to be with you for the first 24 hours of your Same Day Discharge.  ____ Take these medicines the morning of surgery  with A SIP OF WATER:    1. NOTHING  2.   __X__ Stop ALL ASPIRIN PRODUCTS AS OF TODAY, 12/19/20.             THIS INCLUDES BC POWDERS AND GOODIES POWDERS.  __X__  Stop Anti-inflammatories AS OF TODAY, 12/19/20.              THIS INCLUDES IBUPROFEN / MOTRIN / ADVIL / ALEVE                   You may take Tylenol at any time prior to your surgery.   __x__ Stop supplements until after surgery.    IF YOU START SAXENDA BEFORE YOUR SURGERY, DO NOT TAKE ON THE  DAY OF YOUR PROCEDURE.  WEAR CLEAN AND COMFORTABLE CLOTHES TO [place].  You will want something loose around your middle.  BRING PHONE NUMBERS FOR YOUR CONTACTS.  If you have any questions regarding your pre-procedure instructions,  Please call Pre-admit Testing at [phone]

## 2020-12-26 ENCOUNTER — Other Ambulatory Visit: Payer: Self-pay

## 2020-12-26 ENCOUNTER — Ambulatory Visit
Admission: RE | Admit: 2020-12-26 | Discharge: 2020-12-26 | Disposition: A | Discharge status: Home Health | Payer: BC Managed Care – PPO | Attending: Obstetrics and Gynecology | Admitting: Obstetrics and Gynecology

## 2020-12-26 ENCOUNTER — Encounter: Payer: Self-pay | Admitting: Obstetrics and Gynecology

## 2020-12-26 ENCOUNTER — Ambulatory Visit: Payer: BC Managed Care – PPO | Admitting: Anesthesiology

## 2020-12-26 ENCOUNTER — Encounter
Admission: RE | Disposition: A | Discharge status: Home Health | Payer: Self-pay | Source: Home / Self Care | Attending: Obstetrics and Gynecology

## 2020-12-26 DIAGNOSIS — Z87891 Personal history of nicotine dependence: Secondary | ICD-10-CM | POA: Insufficient documentation

## 2020-12-26 DIAGNOSIS — N939 Abnormal uterine and vaginal bleeding, unspecified: Secondary | ICD-10-CM | POA: Diagnosis present

## 2020-12-26 HISTORY — PX: HYSTEROSCOPY WITH D & C: SHX1775

## 2020-12-26 HISTORY — PX: INTRAUTERINE DEVICE (IUD) INSERTION: SHX5877

## 2020-12-26 LAB — POCT PREGNANCY, URINE: Preg Test, Ur: NEGATIVE

## 2020-12-26 SURGERY — DILATATION AND CURETTAGE /HYSTEROSCOPY
Anesthesia: General

## 2020-12-26 MED ORDER — FENTANYL CITRATE (PF) 100 MCG/2ML IJ SOLN: 25.0000 ug | INTRAMUSCULAR | Status: DC | PRN

## 2020-12-26 MED ORDER — DEXMEDETOMIDINE (PRECEDEX) IN NS 20 MCG/5ML (4 MCG/ML) IV SYRINGE
PREFILLED_SYRINGE | INTRAVENOUS | Status: DC | PRN
  Administered 2020-12-26 (×2): 4 ug via INTRAVENOUS

## 2020-12-26 MED ORDER — ONDANSETRON HCL 4 MG/2ML IJ SOLN
INTRAMUSCULAR | Status: AC
  Filled 2020-12-26: qty 2

## 2020-12-26 MED ORDER — CHLORHEXIDINE GLUCONATE 0.12 % MT SOLN
OROMUCOSAL | Status: AC
  Administered 2020-12-26: 15 mL via OROMUCOSAL
  Filled 2020-12-26: qty 15

## 2020-12-26 MED ORDER — LEVONORGESTREL 20 MCG/DAY IU IUD
1.0000 | INTRAUTERINE_SYSTEM | INTRAUTERINE | Status: AC
  Administered 2020-12-26: 1 via INTRAUTERINE
  Filled 2020-12-26: qty 1

## 2020-12-26 MED ORDER — ACETAMINOPHEN 10 MG/ML IV SOLN
INTRAVENOUS | Status: AC
  Filled 2020-12-26: qty 100

## 2020-12-26 MED ORDER — KETOROLAC TROMETHAMINE 30 MG/ML IJ SOLN
INTRAMUSCULAR | Status: AC
  Filled 2020-12-26: qty 1

## 2020-12-26 MED ORDER — MEPERIDINE HCL 25 MG/ML IJ SOLN: 6.2500 mg | INTRAMUSCULAR | Status: DC | PRN

## 2020-12-26 MED ORDER — PROPOFOL 10 MG/ML IV BOLUS
INTRAVENOUS | Status: DC | PRN
  Administered 2020-12-26: 150 mg via INTRAVENOUS

## 2020-12-26 MED ORDER — FAMOTIDINE 20 MG PO TABS
ORAL_TABLET | ORAL | Status: AC
  Administered 2020-12-26: 20 mg via ORAL
  Filled 2020-12-26: qty 1

## 2020-12-26 MED ORDER — PROPOFOL 10 MG/ML IV BOLUS
INTRAVENOUS | Status: AC
  Filled 2020-12-26: qty 20

## 2020-12-26 MED ORDER — ONDANSETRON HCL 4 MG/2ML IJ SOLN: 4.0000 mg | Freq: Once | INTRAMUSCULAR | Status: DC | PRN

## 2020-12-26 MED ORDER — LACTATED RINGERS IV SOLN: INTRAVENOUS | Status: DC

## 2020-12-26 MED ORDER — MIDAZOLAM HCL 2 MG/2ML IJ SOLN
INTRAMUSCULAR | Status: DC | PRN
  Administered 2020-12-26: 2 mg via INTRAVENOUS

## 2020-12-26 MED ORDER — HYDROCODONE-ACETAMINOPHEN 5-325 MG PO TABS: 1.0000 | ORAL_TABLET | Freq: Four times a day (QID) | ORAL | 0 refills | Status: DC | PRN

## 2020-12-26 MED ORDER — LEVONORGESTREL 20 MCG/DAY IU IUD
INTRAUTERINE_SYSTEM | INTRAUTERINE | Status: AC
  Filled 2020-12-26: qty 1

## 2020-12-26 MED ORDER — MIDAZOLAM HCL 2 MG/2ML IJ SOLN
INTRAMUSCULAR | Status: AC
  Filled 2020-12-26: qty 2

## 2020-12-26 MED ORDER — DEXAMETHASONE SODIUM PHOSPHATE 10 MG/ML IJ SOLN
INTRAMUSCULAR | Status: DC | PRN
  Administered 2020-12-26: 10 mg via INTRAVENOUS

## 2020-12-26 MED ORDER — POVIDONE-IODINE 10 % EX SWAB: 2.0000 "application " | Freq: Once | CUTANEOUS | Status: DC

## 2020-12-26 MED ORDER — FENTANYL CITRATE (PF) 100 MCG/2ML IJ SOLN
INTRAMUSCULAR | Status: AC
  Filled 2020-12-26: qty 2

## 2020-12-26 MED ORDER — FENTANYL CITRATE (PF) 100 MCG/2ML IJ SOLN
INTRAMUSCULAR | Status: DC | PRN
  Administered 2020-12-26 (×2): 50 ug via INTRAVENOUS

## 2020-12-26 MED ORDER — PHENYLEPHRINE HCL (PRESSORS) 10 MG/ML IV SOLN
INTRAVENOUS | Status: DC | PRN
  Administered 2020-12-26: 100 ug via INTRAVENOUS
  Administered 2020-12-26: 50 ug via INTRAVENOUS
  Administered 2020-12-26: 100 ug via INTRAVENOUS
  Administered 2020-12-26: 50 ug via INTRAVENOUS
  Administered 2020-12-26: 100 ug via INTRAVENOUS

## 2020-12-26 MED ORDER — CHLORHEXIDINE GLUCONATE 0.12 % MT SOLN: 15.0000 mL | Freq: Once | OROMUCOSAL | Status: AC

## 2020-12-26 MED ORDER — FAMOTIDINE 20 MG PO TABS: 20.0000 mg | ORAL_TABLET | Freq: Once | ORAL | Status: AC

## 2020-12-26 MED ORDER — ONDANSETRON HCL 4 MG/2ML IJ SOLN
INTRAMUSCULAR | Status: DC | PRN
  Administered 2020-12-26: 4 mg via INTRAVENOUS

## 2020-12-26 MED ORDER — ACETAMINOPHEN 10 MG/ML IV SOLN
INTRAVENOUS | Status: DC | PRN
  Administered 2020-12-26: 1000 mg via INTRAVENOUS

## 2020-12-26 MED ORDER — LIDOCAINE HCL (CARDIAC) PF 100 MG/5ML IV SOSY
PREFILLED_SYRINGE | INTRAVENOUS | Status: DC | PRN
  Administered 2020-12-26: 100 mg via INTRAVENOUS

## 2020-12-26 MED ORDER — IBUPROFEN 600 MG PO TABS: 600.0000 mg | ORAL_TABLET | Freq: Four times a day (QID) | ORAL | 3 refills | Status: DC | PRN

## 2020-12-26 MED ORDER — ORAL CARE MOUTH RINSE: 15.0000 mL | Freq: Once | OROMUCOSAL | Status: AC

## 2020-12-26 MED ORDER — KETOROLAC TROMETHAMINE 30 MG/ML IJ SOLN
INTRAMUSCULAR | Status: DC | PRN
  Administered 2020-12-26: 30 mg via INTRAVENOUS

## 2020-12-26 SURGICAL SUPPLY — 26 items
BACTOSHIELD CHG 4% 4OZ (MISCELLANEOUS) ×2
CATH ROBINSON RED A/P 16FR (CATHETERS) ×3 IMPLANT
COVER WAND RF STERILE (DRAPES) ×3 IMPLANT
DEVICE MYOSURE LITE (MISCELLANEOUS) ×2 IMPLANT
DEVICE MYOSURE REACH (MISCELLANEOUS) IMPLANT
ELECT REM PT RETURN 9FT ADLT (ELECTROSURGICAL)
ELECTRODE REM PT RTRN 9FT ADLT (ELECTROSURGICAL) IMPLANT
GLOVE SURG ENC MOIS LTX SZ7 (GLOVE) ×3 IMPLANT
GLOVE SURG UNDER LTX SZ7.5 (GLOVE) ×3 IMPLANT
GOWN STRL REUS W/ TWL LRG LVL3 (GOWN DISPOSABLE) ×2 IMPLANT
GOWN STRL REUS W/TWL LRG LVL3 (GOWN DISPOSABLE) ×6
INFUSOR MANOMETER BAG 3000ML (MISCELLANEOUS) IMPLANT
IV LACTATED RINGER IRRG 3000ML (IV SOLUTION)
IV LR IRRIG 3000ML ARTHROMATIC (IV SOLUTION) IMPLANT
IV NS IRRIG 3000ML ARTHROMATIC (IV SOLUTION) ×3 IMPLANT
KIT PROCEDURE FLUENT (KITS) IMPLANT
KIT TURNOVER CYSTO (KITS) ×3 IMPLANT
MANIFOLD NEPTUNE II (INSTRUMENTS) ×3 IMPLANT
PACK DNC HYST (MISCELLANEOUS) ×3 IMPLANT
PAD OB MATERNITY 4.3X12.25 (PERSONAL CARE ITEMS) ×3 IMPLANT
PAD PREP 24X41 OB/GYN DISP (PERSONAL CARE ITEMS) ×3 IMPLANT
SCRUB CHG 4% DYNA-HEX 4OZ (MISCELLANEOUS) ×1 IMPLANT
SEAL ROD LENS SCOPE MYOSURE (ABLATOR) ×3 IMPLANT
TOWEL OR 17X26 4PK STRL BLUE (TOWEL DISPOSABLE) ×3 IMPLANT
TUBING CONNECTING 10 (TUBING) ×2 IMPLANT
TUBING CONNECTING 10' (TUBING) ×1

## 2020-12-26 NOTE — Transfer of Care (Signed)
Immediate Anesthesia Transfer of Care Note  Patient: Danielle Romero  Procedure(s) Performed: DILATATION AND CURETTAGE /HYSTEROSCOPY INTRAUTERINE DEVICE (IUD) INSERTION  Patient Location: PACU  Anesthesia Type:General  Level of Consciousness: awake, alert  and oriented  Airway & Oxygen Therapy: Patient Spontanous Breathing and Patient connected to face mask oxygen  Post-op Assessment: Report given to RN and Post -op Vital signs reviewed and stable  Post vital signs: Reviewed and stable  Last Vitals:  Vitals Value Taken Time  BP 141/82 12/26/20 1551  Temp    Pulse 86 12/26/20 1553  Resp 13 12/26/20 1553  SpO2 100 % 12/26/20 1553  Vitals shown include unvalidated device data.  Last Pain:  Vitals:   12/26/20 1417  TempSrc: Temporal  PainSc: 0-No pain         Complications: No notable events documented.

## 2020-12-26 NOTE — Anesthesia Procedure Notes (Signed)
Procedure Name: LMA Insertion Date/Time: 12/26/2020 3:19 PM Performed by: Lily Peer, Nasim Garofano, CRNA Pre-anesthesia Checklist: Patient identified, Emergency Drugs available, Suction available and Patient being monitored Patient Re-evaluated:Patient Re-evaluated prior to induction Oxygen Delivery Method: Circle system utilized Preoxygenation: Pre-oxygenation with 100% oxygen Induction Type: IV induction Ventilation: Mask ventilation without difficulty LMA: LMA inserted LMA Size: 4.0 Number of attempts: 1 Placement Confirmation: positive ETCO2 and breath sounds checked- equal and bilateral Tube secured with: Tape Dental Injury: Teeth and Oropharynx as per pre-operative assessment

## 2020-12-26 NOTE — Anesthesia Preprocedure Evaluation (Signed)
Anesthesia Evaluation  Patient identified by MRN, date of birth, ID band Patient awake    Reviewed: Allergy & Precautions, NPO status , Patient's Chart, lab work & pertinent test results  History of Anesthesia Complications Negative for: history of anesthetic complications  Airway Mallampati: II  TM Distance: >3 FB Neck ROM: Full    Dental no notable dental hx.    Pulmonary neg sleep apnea, neg COPD, Current Smoker, former smoker,    breath sounds clear to auscultation- rhonchi (-) wheezing      Cardiovascular Exercise Tolerance: Good hypertension, (-) CAD, (-) Past MI, (-) Cardiac Stents and (-) CABG  Rhythm:Regular Rate:Normal - Systolic murmurs and - Diastolic murmurs    Neuro/Psych negative neurological ROS  negative psych ROS   GI/Hepatic Neg liver ROS, GERD  Controlled,  Endo/Other  negative endocrine ROSneg diabetes  Renal/GU negative Renal ROS     Musculoskeletal negative musculoskeletal ROS (+)   Abdominal (+) + obese,   Peds  Hematology  (+) anemia ,   Anesthesia Other Findings Past Medical History: No date: Breast mass No date: Bronchitis No date: GERD (gastroesophageal reflux disease)     Comment:  NO MEDS No date: Obesity (BMI 30-39.9)   Reproductive/Obstetrics (+) Breast feeding                              Lab Results  Component Value Date   WBC 15.4 (H) 05/25/2018   HGB 10.6 (L) 05/25/2018   HCT 32.8 (L) 05/25/2018   MCV 78.5 (L) 05/25/2018   PLT 207 05/25/2018    Anesthesia Physical  Anesthesia Plan  ASA: 2  Anesthesia Plan: General   Post-op Pain Management:    Induction: Intravenous  PONV Risk Score and Plan: 1 and Ondansetron and Midazolam  Airway Management Planned: LMA  Additional Equipment:   Intra-op Plan:   Post-operative Plan:   Informed Consent: I have reviewed the patients History and Physical, chart, labs and discussed the  procedure including the risks, benefits and alternatives for the proposed anesthesia with the patient or authorized representative who has indicated his/her understanding and acceptance.     Dental advisory given  Plan Discussed with: CRNA, Anesthesiologist and Surgeon  Anesthesia Plan Comments:         Anesthesia Quick Evaluation

## 2020-12-26 NOTE — Op Note (Signed)
Preoperative Diagnosis: 1) 40 y.o. with abnormal uterine bleeding  Postoperative Diagnosis: 1) 40 y.o. with abnormal uterine bleeding  Operation Performed: Hysteroscopy, dilation and curettage using myosure, and Mirena IUD placement  Indication: Abnormal uterine bleeding with ultrasound report suspicious for endometrial polyp  Anesthesia: General  Primary Surgeon: Malachy Mood, MD  Assistant: none  Preoperative Antibiotics: none  Estimated Blood Loss: 20 mL  IV Fluids: 522mL  Urine Output:: ~69mL straiggt cath  Drains or Tubes: none  Implants: none  Specimens Removed: endometrial curettings  Complications: none  Intraoperative Findings:  Normal cervix, normal uterine cavity, no evidence of uterine polyp  Patient Condition: stable  Procedure in Detail:  Patient was taken to the operating room were she was administered general endotracheal anesthesia.  She was positioned in the dorsal lithotomy position utilizing Allen stirups, prepped and draped in the usual sterile fashion.  Uterus was noted to be non-enlarged in size, anteverted.   Prior to proceeding with the case a time out was performed.  Attention was turned to the patient's pelvis.  A red rubber catheter was used to empty the patient's bladder.  An operative speculum was placed to allow visualization of the cervix.  The anterior lip of the cervix was grasped with a single tooth tenaculum and the cervix was sequentially dilated using pratt dilators.  The hysteroscope was then advanced into the uterine cavity noting the above findings.  Four quadrant curettage was performed using a myosure and the resulting specimen collected and sent to pathology.  Uterus sounded to 8 cm. IUD placed per manufacturer's recommendations.  Strings trimmed to 3 cm. Tenaculum was removed, good hemostasis noted. Sponge needle and instrument counts were corrects times two.  The patient tolerated the procedure well and was taken to the recovery  room in stable condition.

## 2020-12-26 NOTE — Anesthesia Postprocedure Evaluation (Signed)
Anesthesia Post Note  Patient: Tracey Harries  Procedure(s) Performed: DILATATION AND CURETTAGE /HYSTEROSCOPY INTRAUTERINE DEVICE (IUD) INSERTION  Patient location during evaluation: PACU Anesthesia Type: General Level of consciousness: awake and alert Pain management: pain level controlled Vital Signs Assessment: post-procedure vital signs reviewed and stable Respiratory status: spontaneous breathing, nonlabored ventilation, respiratory function stable and patient connected to nasal cannula oxygen Cardiovascular status: blood pressure returned to baseline and stable Postop Assessment: no apparent nausea or vomiting Anesthetic complications: no   No notable events documented.   Last Vitals:  Vitals:   12/26/20 1628 12/26/20 1637  BP: (!) 126/92 (!) 151/88  Pulse: 74 72  Resp: 17 16  Temp: (!) 36.3 C (!) 36.3 C  SpO2: 99% 100%    Last Pain:  Vitals:   12/26/20 1637  TempSrc: Temporal  PainSc: 3                  Precious Haws Pressley Barsky

## 2020-12-26 NOTE — Discharge Instructions (Signed)
AMBULATORY SURGERY  DISCHARGE INSTRUCTIONS   The drugs that you were given will stay in your system until tomorrow so for the next 24 hours you should not:  Drive an automobile Make any legal decisions Drink any alcoholic beverage   You may resume regular meals tomorrow.  Today it is better to start with liquids and gradually work up to solid foods.  You may eat anything you prefer, but it is better to start with liquids, then soup and crackers, and gradually work up to solid foods.   Please notify your doctor immediately if you have any unusual bleeding, trouble breathing, redness and pain at the surgery site, drainage, fever, or pain not relieved by medication.       Please contact your physician with any problems or Same Day Surgery at [phone], Monday through Friday 6 am to 4 pm, or [place] [facility] [place] [facility] number at [phone].  

## 2020-12-26 NOTE — Interval H&P Note (Signed)
History and Physical Interval Note:  12/26/2020 2:53 PM  Danielle Romero  has presented today for surgery, with the diagnosis of abnormal uterine bleeding  abnormal appearance endometrium.  The various methods of treatment have been discussed with the patient and family. After consideration of risks, benefits and other options for treatment, the patient has consented to  Procedure(s): DILATATION AND CURETTAGE /HYSTEROSCOPY (N/A) INTRAUTERINE DEVICE (IUD) INSERTION (N/A) as a surgical intervention.  The patient's history has been reviewed, patient examined, no change in status, stable for surgery.  I have reviewed the patient's chart and labs.  Questions were answered to the patient's satisfaction.     Malachy Mood

## 2020-12-27 ENCOUNTER — Encounter: Payer: Self-pay | Admitting: Obstetrics and Gynecology

## 2020-12-30 LAB — SURGICAL PATHOLOGY

## 2021-02-26 ENCOUNTER — Ambulatory Visit (INDEPENDENT_AMBULATORY_CARE_PROVIDER_SITE_OTHER): Payer: BC Managed Care – PPO | Admitting: Obstetrics and Gynecology

## 2021-02-26 ENCOUNTER — Encounter: Payer: Self-pay | Admitting: Obstetrics and Gynecology

## 2021-02-26 ENCOUNTER — Other Ambulatory Visit: Payer: Self-pay

## 2021-02-26 VITALS — BP 154/100 | Ht 67.0 in | Wt 259.0 lb

## 2021-02-26 DIAGNOSIS — I10 Essential (primary) hypertension: Secondary | ICD-10-CM | POA: Diagnosis not present

## 2021-02-26 DIAGNOSIS — Z30431 Encounter for routine checking of intrauterine contraceptive device: Secondary | ICD-10-CM

## 2021-02-26 MED ORDER — HYDROCHLOROTHIAZIDE 25 MG PO TABS: 25.0000 mg | ORAL_TABLET | Freq: Every day | ORAL | 11 refills | Status: DC

## 2021-02-26 NOTE — Progress Notes (Signed)
Obstetrics & Gynecology Office Visit   Chief Complaint:  Chief Complaint  Patient presents with   Follow-up    Weight loss - did not take Saxenda because could not afford it. Discuss restarting phentermine. RM 2    History of Present Illness: 40 y.o. patient presenting for follow up of Mirena IUD placement  3 months  ago.  The indication for her IUD was cycle control.  She denies any complications since her IUD placement.  Still having some occasional spotting.  Has not been able to feel strings.  Patient also presents for BP check today, no headaches, no chest pain.  BP today remains elevated.    Review of Systems: Review of Systems  Constitutional: Negative.   Cardiovascular:  Negative for chest pain.  Gastrointestinal:  Negative for abdominal pain.  Genitourinary: Negative.    Past Medical History:  Past Medical History:  Diagnosis Date   Breast mass    Bronchitis    GERD (gastroesophageal reflux disease)    NO MEDS   Hypertension    Obesity (BMI 30-39.9)     Past Surgical History:  Past Surgical History:  Procedure Laterality Date   BREAST BIOPSY Left 08/13/2016   Procedure: left BREAST BIOPSY WITH NEEDLE LOCALIZATION left Moeller's gland excision;  Surgeon: Florene Glen, MD;  Location: [facility];  Service: General;  Laterality: Left;   CESAREAN SECTION  2007   HYSTEROSCOPY WITH D & C N/A 12/26/2020   Procedure: DILATATION AND CURETTAGE /HYSTEROSCOPY;  Surgeon: Malachy Mood, MD;  Location: [facility];  Service: Gynecology;  Laterality: N/A;   INTRAUTERINE DEVICE (IUD) INSERTION N/A 12/26/2020   Procedure: INTRAUTERINE DEVICE (IUD) INSERTION;  Surgeon: Malachy Mood, MD;  Location: [facility];  Service: Gynecology;  Laterality: N/A;   TUBAL LIGATION Bilateral 05/25/2018   Procedure: POST PARTUM TUBAL LIGATION;  Surgeon: Homero Fellers, MD;  Location: [facility];  Service: Gynecology;  Laterality: Bilateral;    Gynecologic History: No LMP recorded (lmp  unknown).  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   Feels safe in her home.   Social Determinants of Health   Financial Resource Strain: Not on file  Food Insecurity: Not on file  Transportation Needs: Not on file  Physical Activity: Not  on file  Stress: Not on file  Social Connections: Not on file  Intimate Partner Violence: Not on file    Allergies:  Allergies  Allergen Reactions   Tape Rash    Adhesive (plastic) tape tears up skin    Medications: Prior to Admission medications   Medication Sig Start Date End Date Taking? Authorizing Provider  HYDROcodone-acetaminophen (NORCO/VICODIN) 5-325 MG tablet Take 1 tablet by mouth every 6 (six) hours as needed. Patient not taking: Reported on 02/26/2021 12/26/20   Malachy Mood, MD  ibuprofen (ADVIL) 600 MG tablet Take 1 tablet (600 mg total) by mouth every 6 (six) hours as needed. Patient not taking: Reported on 02/26/2021 12/26/20    Malachy Mood, MD  Insulin Pen Needle (NOVOFINE PEN NEEDLE) 32G X 6 MM MISC 1 Units by Does not apply route daily. 12/18/20   Malachy Mood, MD  Liraglutide -Weight Management (SAXENDA) 18 MG/3ML SOPN Inject 0.6 mg into the skin daily for 7 days, THEN 1.2 mg daily for 7 days, THEN 1.8 mg daily for 7 days, THEN 2.4 mg daily for 7 days, THEN 3 mg daily. 12/18/20 11/11/21  Malachy Mood, MD    Physical Exam Blood pressure (!) 154/100, height '5\' 7"'$  (1.702 m), weight 259 lb (117.5 kg), unknown if currently breastfeeding. No LMP recorded (lmp unknown).  General: NAD HEENT: normocephalic, anicteric Pulmonary: No increased work of breathing  Genitourinary:  External: Normal external female genitalia.  Normal urethral meatus, normal  Bartholin's and Skene's glands.    Vagina: Normal vaginal mucosa, no evidence of prolapse.    Cervix: Grossly normal in appearance, no bleeding, IUD strings visualized 2cm  Uterus: Non-enlarged, mobile, normal contour.  No CMT  Adnexa: ovaries non-enlarged, no adnexal masses  Rectal: deferred  Lymphatic: no evidence of inguinal lymphadenopathy Extremities: no edema, erythema, or tenderness Neurologic: Grossly intact Psychiatric: mood appropriate, affect full  Female chaperone present for pelvic and breast  portions of the physical exam  Assessment: 40 y.o. [id number] medication follow up   Plan: Problem List Items Addressed This Visit   None Visit Diagnoses     Essential hypertension    -  Primary   Relevant Medications   hydrochlorothiazide (HYDRODIURIL) 25 MG tablet   IUD check up          1.  The patient was given instructions to check her IUD strings monthly and call with any problems or concerns.  She should call for fevers, chills, abnormal vaginal discharge, pelvic pain, or other complaints.  2.   IUDs while effective at preventing pregnancy do not prevent transmission of sexually transmitted diseases and use of barrier methods for this  purpose was discussed.  Low overall incidence of failure with 99.7% efficacy rate in typical use.  The patient has not contraindication to IUD placement.  3.  She will return for a annual exam in 1 year.  All questions answered.  4) A total of 15 minutes were spent in face-to-face contact with the patient during this encounter with over half of that time devoted to counseling and coordination of care.  5) BP elevated today still - will start HCTZ hold off on phentermine.  Was not approved for Saxenda  6) Return in about 4 weeks (around 03/26/2021) for Follow up BP check.   Malachy Mood, MD, [facility], [facility] 02/26/2021, 10:17 AM

## 2021-03-27 ENCOUNTER — Ambulatory Visit: Payer: BC Managed Care – PPO | Admitting: Obstetrics and Gynecology

## 2021-04-09 ENCOUNTER — Other Ambulatory Visit: Payer: Self-pay | Admitting: Obstetrics and Gynecology

## 2021-04-09 MED ORDER — VALACYCLOVIR HCL 500 MG PO TABS: 500.0000 mg | ORAL_TABLET | Freq: Two times a day (BID) | ORAL | 6 refills | Status: AC

## 2021-08-24 ENCOUNTER — Encounter: Payer: Self-pay | Admitting: Oncology

## 2021-11-24 ENCOUNTER — Ambulatory Visit
Admission: RE | Admit: 2021-11-24 | Discharge: 2021-11-24 | Disposition: A | Discharge status: Home / Self Care | Payer: BC Managed Care – PPO | Source: Ambulatory Visit | Attending: Emergency Medicine | Admitting: Emergency Medicine

## 2021-11-24 VITALS — BP 164/92 | HR 90 | Temp 98.6°F | Resp 18 | Ht 67.0 in | Wt 250.0 lb

## 2021-11-24 DIAGNOSIS — B9689 Other specified bacterial agents as the cause of diseases classified elsewhere: Secondary | ICD-10-CM

## 2021-11-24 DIAGNOSIS — N760 Acute vaginitis: Secondary | ICD-10-CM | POA: Diagnosis not present

## 2021-11-24 DIAGNOSIS — M549 Dorsalgia, unspecified: Secondary | ICD-10-CM | POA: Diagnosis not present

## 2021-11-24 LAB — URINALYSIS, ROUTINE W REFLEX MICROSCOPIC
Bilirubin Urine: NEGATIVE
Glucose, UA: NEGATIVE mg/dL
Hgb urine dipstick: NEGATIVE
Ketones, ur: NEGATIVE mg/dL
Leukocytes,Ua: NEGATIVE
Nitrite: NEGATIVE
Protein, ur: 30 mg/dL — AB
Specific Gravity, Urine: 1.015 (ref 1.005–1.030)
pH: 6.5 (ref 5.0–8.0)

## 2021-11-24 LAB — WET PREP, GENITAL
Sperm: NONE SEEN
Trich, Wet Prep: NONE SEEN
WBC, Wet Prep HPF POC: <10 — AB (ref <10)
Yeast Wet Prep HPF POC: NONE SEEN

## 2021-11-24 LAB — URINALYSIS, MICROSCOPIC (REFLEX)

## 2021-11-24 LAB — PREGNANCY, URINE: Preg Test, Ur: NEGATIVE

## 2021-11-24 MED ORDER — METRONIDAZOLE 500 MG PO TABS: 500.0000 mg | ORAL_TABLET | Freq: Two times a day (BID) | ORAL | 0 refills | Status: DC

## 2021-11-24 NOTE — ED Triage Notes (Signed)
Patient is here for "Back Pain". "Pretty sure I have a UTI". "Back pain" started "yesterday", seems to be "lower right". No dysuria. No blood seen in urine. No urine frequency/urgency. No fever. No injury known.  ?

## 2021-11-24 NOTE — ED Provider Notes (Signed)
?[facility] ? ? ? ?CSN: [id number] ?Arrival date & time: 11/24/21  1247 ? ? ?  ? ?History   ?Chief Complaint ?Chief Complaint  ?Patient presents with  ? Back Pain  ?  Pretty sure I have a kidney infection but my back hurts really bad no fever no chills no frequent urge to pee. - Entered by patient ?Appt  ? ? ?HPI ?Danielle Romero is a 41 y.o. female.  ? ?HPI ? ?41 year old female here for evaluation of back pain. ? ?Patient reports that she has been experiencing pain in her right-sided low back since yesterday.  This is not associated with any injury or heavy lifting.  She states she is concerned she might have a urinary tract infection but she denies any pain with urination, urinary urgency, urinary frequency, blood in her urine, or cloudiness to her urine.  She has not had a fever and she denies any vaginal itching or bleeding.  She states that approximately week before her symptoms started in her low back she did experience a creamy vaginal discharge but that has since resolved.  She also denies any abdominal pain. ? ?Past Medical History:  ?Diagnosis Date  ? Breast mass   ? Bronchitis   ? GERD (gastroesophageal reflux disease)   ? NO MEDS  ? Hypertension   ? Obesity (BMI 30-39.9)   ? ? ?Patient Active Problem List  ? Diagnosis Date Noted  ? Anemia of pregnancy 04/07/2018  ? Heartburn during pregnancy in second trimester 01/26/2018  ? History of cesarean delivery 10/06/2017  ? Breast mass, left   ? Abnormal mammogram of left breast   ? ? ?Past Surgical History:  ?Procedure Laterality Date  ? BREAST BIOPSY Left 08/13/2016  ? Procedure: left BREAST BIOPSY WITH NEEDLE LOCALIZATION left Moeller's gland excision;  Surgeon: Florene Glen, MD;  Location: [facility];  Service: General;  Laterality: Left;  ? CESAREAN SECTION  2007  ? HYSTEROSCOPY WITH D & C N/A 12/26/2020  ? Procedure: DILATATION AND CURETTAGE /HYSTEROSCOPY;  Surgeon: Malachy Mood, MD;  Location: [facility];  Service: Gynecology;   Laterality: N/A;  ? INTRAUTERINE DEVICE (IUD) INSERTION N/A 12/26/2020  ? Procedure: INTRAUTERINE DEVICE (IUD) INSERTION;  Surgeon: Malachy Mood, MD;  Location: [facility];  Service: Gynecology;  Laterality: N/A;  ? TUBAL LIGATION Bilateral 05/25/2018  ? Procedure: POST PARTUM TUBAL LIGATION;  Surgeon: Homero Fellers, MD;  Location: [facility];  Service: Gynecology;  Laterality: Bilateral;  ? ? ?OB History   ? ? Gravida  ?6  ? Para  ?5  ? Term  ?5  ? Preterm  ?   ? AB  ?1  ? Living  ?5  ?  ? ? SAB  ?   ? IAB  ?   ? Ectopic  ?   ? Multiple  ?0  ? Live Births  ?5  ?   ?  ?  ? ? ? ?Home Medications   ? ?Prior to Admission medications   ?Medication Sig Start Date End Date Taking? Authorizing Provider  ?levonorgestrel (MIRENA) 20 MCG/DAY IUD 1 each by Intrauterine route once.   Yes [provider]  ?metroNIDAZOLE (FLAGYL) 500 MG tablet Take 1 tablet (500 mg total) by mouth 2 (two) times daily. 11/24/21  Yes Margarette Canada, NP  ?hydrochlorothiazide (HYDRODIURIL) 25 MG tablet Take 1 tablet (25 mg total) by mouth daily. 02/26/21   Malachy Mood, MD  ?valACYclovir (VALTREX) 500 MG tablet Take 500 mg by mouth 2 (  two) times daily. 10/08/21   [provider]  ? ? ?Family History ?Family History  ?Problem Relation Age of Onset  ? Hypertension Mother   ? Breast cancer Mother 59  ? Kidney disease Mother   ? Cirrhosis Father   ? Alcohol abuse Father   ? ? ?Social History ?Social History  ? ?Tobacco Use  ? Smoking status: Former  ?  Packs/day: 0.25  ?  Years: 16.00  ?  Pack years: 4.00  ?  Types: Cigarettes  ?  Quit date: 11/05/2019  ?  Years since quitting: 2.0  ? Smokeless tobacco: Never  ?Vaping Use  ? Vaping Use: Every day  ? Substances: Nicotine, Flavoring  ?Substance Use Topics  ? Alcohol use: Yes  ?  Comment: Occasional  ? Drug use: Never  ? ? ? ?Allergies   ?Tape ? ? ?Review of Systems ?Review of Systems  ?Constitutional:  Negative for fever.  ?Gastrointestinal:  Negative for abdominal pain.   ?Genitourinary:  Positive for vaginal discharge. Negative for dysuria, frequency, hematuria, urgency, vaginal bleeding and vaginal pain.  ?Musculoskeletal:  Positive for back pain.  ?Hematological: Negative.   ?Psychiatric/Behavioral: Negative.    ? ? ?Physical Exam ?Triage Vital Signs ?ED Triage Vitals  ?Enc Vitals Group  ?   BP 11/24/21 1259 (!) 164/92  ?   Pulse Rate 11/24/21 1259 90  ?   Resp 11/24/21 1259 18  ?   Temp 11/24/21 1259 98.6 ?F (37 ?C)  ?   Temp Source 11/24/21 1259 Oral  ?   SpO2 11/24/21 1259 98 %  ?   Weight 11/24/21 1257 250 lb (113.4 kg)  ?   Height 11/24/21 1257 '5\' 7"'$  (1.702 m)  ?   Head Circumference --   ?   Peak Flow --   ?   Pain Score 11/24/21 1254 8  ?   Pain Loc --   ?   Pain Edu? --   ?   Excl. in [place]? --   ? ?No data found. ? ?Updated Vital Signs ?BP (!) 164/92 (BP Location: Left Arm)   Pulse 90   Temp 98.6 ?F (37 ?C) (Oral)   Resp 18   Ht '5\' 7"'$  (1.702 m)   Wt 250 lb (113.4 kg)   LMP  (LMP Unknown)   SpO2 98%   BMI 39.16 kg/m?  ? ?Visual Acuity ?Right Eye Distance:   ?Left Eye Distance:   ?Bilateral Distance:   ? ?Right Eye Near:   ?Left Eye Near:    ?Bilateral Near:    ? ?Physical Exam ?Vitals and nursing note reviewed.  ?Constitutional:   ?   Appearance: Normal appearance. She is not ill-appearing.  ?HENT:  ?   Head: Normocephalic and atraumatic.  ?Cardiovascular:  ?   Rate and Rhythm: Normal rate and regular rhythm.  ?   Pulses: Normal pulses.  ?   Heart sounds: Normal heart sounds. No murmur heard. ?  No friction rub. No gallop.  ?Pulmonary:  ?   Effort: Pulmonary effort is normal.  ?   Breath sounds: Normal breath sounds. No wheezing, rhonchi or rales.  ?Abdominal:  ?   General: Abdomen is flat.  ?   Palpations: Abdomen is soft.  ?   Tenderness: There is no abdominal tenderness. There is no right CVA tenderness, left CVA tenderness, guarding or rebound.  ?Skin: ?   General: Skin is warm and dry.  ?   Capillary Refill: Capillary refill takes less than 2  seconds.  ?    Findings: No erythema or rash.  ?Neurological:  ?   General: No focal deficit present.  ?   Mental Status: She is alert and oriented to person, place, and time.  ?Psychiatric:     ?   Mood and Affect: Mood normal.     ?   Behavior: Behavior normal.     ?   Thought Content: Thought content normal.     ?   Judgment: Judgment normal.  ? ? ? ?UC Treatments / Results  ?Labs ?(all labs ordered are listed, but only abnormal results are displayed) ?Labs Reviewed  ?WET PREP, GENITAL - Abnormal; Notable for the following components:  ?    Result Value  ? Clue Cells Wet Prep HPF POC PRESENT (*)   ? WBC, Wet Prep HPF POC <10 (*)   ? All other components within normal limits  ?URINALYSIS, ROUTINE W REFLEX MICROSCOPIC - Abnormal; Notable for the following components:  ? Protein, ur 30 (*)   ? All other components within normal limits  ?URINALYSIS, MICROSCOPIC (REFLEX) - Abnormal; Notable for the following components:  ? Bacteria, UA RARE (*)   ? All other components within normal limits  ?PREGNANCY, URINE  ? ? ?EKG ? ? ?Radiology ?No results found. ? ?Procedures ?Procedures (including critical care time) ? ?Medications Ordered in UC ?Medications - No data to display ? ?Initial Impression / Assessment and Plan / UC Course  ?I have reviewed the triage vital signs and the nursing notes. ? ?Pertinent labs & imaging results that were available during my care of the patient were reviewed by me and considered in my medical decision making (see chart for details). ? ?Patient is a very pleasant, nontoxic-appearing 41 year old female here for evaluation of low back pain that she believes is a result of a urinary tract infection.  Patient is not having any urinary symptoms but she does report a vaginal discharge that was creamy in texture, white in color, and preceded onset of symptoms by approximately a week.  She states that the vaginal discharge has resolved at this point.  She also denies any fever, hematuria, or cloudiness to her  urine.  On exam patient has benign cardiopulmonary exam with clear lung sounds in all fields.  No CVA tenderness on exam the patient does have some mild tenderness with palpation of the right lower lumbar paraspinous region.  Abdomen is p

## 2021-11-24 NOTE — Discharge Instructions (Addendum)

## 2021-12-11 DIAGNOSIS — Z1231 Encounter for screening mammogram for malignant neoplasm of breast: Secondary | ICD-10-CM

## 2022-02-24 ENCOUNTER — Ambulatory Visit (INDEPENDENT_AMBULATORY_CARE_PROVIDER_SITE_OTHER): Payer: BC Managed Care – PPO | Admitting: Obstetrics and Gynecology

## 2022-02-24 ENCOUNTER — Encounter: Payer: Self-pay | Admitting: Obstetrics and Gynecology

## 2022-02-24 ENCOUNTER — Other Ambulatory Visit (HOSPITAL_COMMUNITY)
Admission: RE | Admit: 2022-02-24 | Discharge: 2022-02-24 | Disposition: A | Discharge status: Home / Self Care | Payer: BC Managed Care – PPO | Source: Ambulatory Visit | Attending: Obstetrics and Gynecology | Admitting: Obstetrics and Gynecology

## 2022-02-24 VITALS — BP 140/80 | Ht 67.0 in | Wt 250.0 lb

## 2022-02-24 DIAGNOSIS — Z1231 Encounter for screening mammogram for malignant neoplasm of breast: Secondary | ICD-10-CM

## 2022-02-24 DIAGNOSIS — Z124 Encounter for screening for malignant neoplasm of cervix: Secondary | ICD-10-CM

## 2022-02-24 DIAGNOSIS — Z803 Family history of malignant neoplasm of breast: Secondary | ICD-10-CM | POA: Insufficient documentation

## 2022-02-24 DIAGNOSIS — Z1151 Encounter for screening for human papillomavirus (HPV): Secondary | ICD-10-CM

## 2022-02-24 DIAGNOSIS — A6004 Herpesviral vulvovaginitis: Secondary | ICD-10-CM

## 2022-02-24 DIAGNOSIS — Z01419 Encounter for gynecological examination (general) (routine) without abnormal findings: Secondary | ICD-10-CM

## 2022-02-24 DIAGNOSIS — Z30431 Encounter for routine checking of intrauterine contraceptive device: Secondary | ICD-10-CM

## 2022-02-24 MED ORDER — VALACYCLOVIR HCL 500 MG PO TABS: 500.0000 mg | ORAL_TABLET | Freq: Two times a day (BID) | ORAL | 1 refills | Status: DC

## 2022-02-24 NOTE — Progress Notes (Signed)
PCP: [facility] [place]   Chief Complaint  Patient presents with   Breast Exam    Noticed lump under armpit on left side, gone now. Not painful.   Gynecologic Exam    annual    HPI:      [name] is a 41 y.o. [id number] whose LMP was No LMP recorded. (Menstrual status: IUD)., presents today for her annual examination.  Her menses are absent with IUD; has occas light brown d/c. Has had 2 episodes severe cramping, lasting 30-45 min with subsequent brown d/c. No dysmen otherwise. Mirena IUD placed 12/26/20 during hyst/D&C for AUB.   Sex activity: single partner, contraception - TL/IUD, Mirena placed 12/26/20. She does not have vaginal dryness. No pain/bleeding.   Last Pap: 12/01/17  Results were: no abnormalities /neg HPV DNA.  Hx of HSV 2, takes valtrex prn. Needs RF.   Last mammogram: 06/2016 Results were: Cat 4 for LT breast calcifications with neg bx. Mammo due There is a FH of breast cancer in her mom and sister, genetic testing not indicated for pt, and not done for sister. There is no FH of ovarian cancer. The patient does do self-breast exams.  Noticed mass in LT axilla recently that resolved. Not tender, no d/c.   Tobacco use: vapes daily Alcohol use: social drinker No drug use Exercise: moderately active  She does not get adequate calcium and Vitamin D in her diet.  Labs with PCP. Pt is doing diet changes and lost 10# in past 6 wks.    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   Family history of breast cancer 02/24/2022   Anemia of pregnancy 04/07/2018   Heartburn during pregnancy in second trimester 01/26/2018   History of cesarean delivery 10/06/2017   Breast mass, left    Abnormal mammogram of left breast     Past Surgical History:  Procedure Laterality Date   BREAST BIOPSY Left 08/13/2016   Procedure: left BREAST BIOPSY WITH NEEDLE LOCALIZATION left Moeller's gland excision;   Surgeon: Florene Glen, MD;  Location: [facility];  Service: General;  Laterality: Left;   CESAREAN SECTION  2007   HYSTEROSCOPY WITH D & C N/A 12/26/2020   Procedure: DILATATION AND CURETTAGE /HYSTEROSCOPY;  Surgeon: Malachy Mood, MD;  Location: [facility];  Service: Gynecology;  Laterality: N/A;   INTRAUTERINE DEVICE (IUD) INSERTION N/A 12/26/2020   Procedure: INTRAUTERINE DEVICE (IUD) INSERTION;  Surgeon: Malachy Mood, MD;  Location: [facility];  Service: Gynecology;  Laterality: N/A;   TUBAL LIGATION Bilateral 05/25/2018   Procedure: POST PARTUM TUBAL LIGATION;  Surgeon: Homero Fellers, MD;  Location: [facility];  Service: Gynecology;  Laterality: Bilateral;    Family History  Problem Relation Age of Onset   Hypertension Mother    Breast cancer Mother 13   Kidney disease Mother    Cirrhosis Father    Alcohol abuse Father    Breast cancer Sister 41    Social History   Socioeconomic History   Marital status: Married    Spouse name: Legrand Como   Number of children: 4   Years of education: Not on file   Highest education level: Not on file  Occupational History   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    Comment: Mirena  Other Topics Concern   Not on file  Social History Narrative   Patient lives with her husband and 4 kids.   Feels safe in her home.   Social Determinants of Health   Financial Resource Strain: Not on file  Food Insecurity: Not on file  Transportation Needs: Not on file  Physical Activity: Not on file  Stress: Not  on file  Social Connections: Not on file  Intimate Partner Violence: Not on file     Current Outpatient Medications:    hydrochlorothiazide (HYDRODIURIL) 25 MG tablet, Take 1 tablet (25 mg total) by mouth daily., Disp: 30 tablet, Rfl: 11   levonorgestrel (MIRENA) 20 MCG/DAY IUD, 1 each by Intrauterine route once., Disp: , Rfl:    valACYclovir (VALTREX) 500 MG tablet, Take 1 tablet (500 mg total) by mouth 2 (two) times daily. For 3 days prn sx, Disp: 30 tablet, Rfl: 1     ROS:  Review of Systems  Constitutional:  Negative for fatigue, fever and unexpected weight change.  Respiratory:  Negative for cough, shortness of breath and wheezing.   Cardiovascular:  Negative for chest pain, palpitations and leg swelling.  Gastrointestinal:  Negative for blood in stool, constipation, diarrhea, nausea and vomiting.  Endocrine: Negative for cold intolerance, heat intolerance and polyuria.  Genitourinary:  Negative for dyspareunia, dysuria, flank pain, frequency, genital sores, hematuria, menstrual problem, pelvic pain, urgency, vaginal bleeding, vaginal discharge and vaginal pain.  Musculoskeletal:  Positive for arthralgias. Negative for back pain, joint swelling and myalgias.  Skin:  Negative for rash.  Neurological:  Negative for dizziness, syncope, light-headedness, numbness and headaches.  Hematological:  Negative for adenopathy.  Psychiatric/Behavioral:  Negative for agitation, confusion, sleep disturbance and suicidal ideas. The patient is not nervous/anxious.    BREAST: No symptoms    Objective: BP (!) 140/80   Ht '5\' 7"'$  (1.702 m)   Wt 250 lb (113.4 kg)   Breastfeeding No   BMI 39.16 kg/m    Physical Exam Constitutional:      Appearance: She is well-developed.  Genitourinary:     Vulva normal.     Right Labia: No rash, tenderness or lesions.    Left Labia: No tenderness, lesions or rash.    No vaginal discharge, erythema or tenderness.      Right Adnexa: not tender and no  mass present.    Left Adnexa: not tender and no mass present.    No cervical friability or polyp.     IUD strings visualized.     Uterus is not enlarged or tender.  Breasts:    Right: No mass, nipple discharge, skin change or tenderness.     Left: No mass, nipple discharge, skin change or tenderness.  Neck:     Thyroid: No thyromegaly.  Cardiovascular:     Rate and Rhythm: Normal rate and regular rhythm.     Heart sounds: Normal heart sounds. No murmur heard. Pulmonary:     Effort: Pulmonary effort is normal.     Breath sounds: Normal breath sounds.  Abdominal:     Palpations: Abdomen is soft.     Tenderness:  There is no abdominal tenderness. There is no guarding or rebound.  Musculoskeletal:        General: Normal range of motion.     Cervical back: Normal range of motion.  Lymphadenopathy:     Cervical: No cervical adenopathy.  Neurological:     General: No focal deficit present.     Mental Status: She is alert and oriented to person, place, and time.     Cranial Nerves: No cranial nerve deficit.  Skin:    General: Skin is warm and dry.  Psychiatric:        Mood and Affect: Mood normal.        Behavior: Behavior normal.        Thought Content: Thought content normal.        Judgment: Judgment normal.  Vitals reviewed.     Assessment/Plan:  Encounter for annual routine gynecological examination  Cervical cancer screening - Plan: Cytology - PAP  Screening for HPV (human papillomavirus) - Plan: Cytology - PAP  Encounter for routine checking of intrauterine contraceptive device (IUD)--IUD strings in cx os; has 8 yr indication  Encounter for screening mammogram for malignant neoplasm of breast - Plan: MM 3D SCREEN BREAST BILATERAL; pt to schedule mammo  Family history of breast cancer--pt doesn't qualify for cancer genetic testing. Will continue to follow FH.   Herpes simplex vulvovaginitis - Plan: valACYclovir (VALTREX) 500 MG tablet; Rx RF valtrex prn   Meds  ordered this encounter  Medications   valACYclovir (VALTREX) 500 MG tablet    Sig: Take 1 tablet (500 mg total) by mouth 2 (two) times daily. For 3 days prn sx    Dispense:  30 tablet    Refill:  1    Order Specific Question:   Supervising Provider    Answer:   Renaldo Reel            GYN counsel breast self exam, mammography screening, adequate intake of calcium and vitamin D, diet and exercise    F/U  Return in about 1 year (around 02/25/2023).  Nicklas Mcsweeney B. Anamarie Hunn, PA-C 02/24/2022 4:12 PM

## 2022-02-24 NOTE — Patient Instructions (Addendum)
I value your feedback and you entrusting us with your care. If you get a [facility] patient survey, I would appreciate you taking the time to let us know about your experience today. Thank you!  [facility]: [phone]      

## 2022-02-26 LAB — CYTOLOGY - PAP
Adequacy: ABSENT
Comment: NEGATIVE
Diagnosis: NEGATIVE
High risk HPV: NEGATIVE

## 2022-03-10 ENCOUNTER — Other Ambulatory Visit: Payer: Self-pay | Admitting: Obstetrics and Gynecology

## 2022-03-10 DIAGNOSIS — A6004 Herpesviral vulvovaginitis: Secondary | ICD-10-CM

## 2022-03-18 ENCOUNTER — Ambulatory Visit
Admission: RE | Admit: 2022-03-18 | Discharge: 2022-03-18 | Disposition: A | Discharge status: Home / Self Care | Payer: BC Managed Care – PPO | Source: Ambulatory Visit | Attending: Obstetrics and Gynecology | Admitting: Obstetrics and Gynecology

## 2022-03-18 DIAGNOSIS — Z1231 Encounter for screening mammogram for malignant neoplasm of breast: Secondary | ICD-10-CM | POA: Insufficient documentation

## 2022-03-23 ENCOUNTER — Other Ambulatory Visit: Payer: Self-pay | Admitting: Obstetrics and Gynecology

## 2022-03-23 DIAGNOSIS — R928 Other abnormal and inconclusive findings on diagnostic imaging of breast: Secondary | ICD-10-CM

## 2022-03-23 DIAGNOSIS — N6489 Other specified disorders of breast: Secondary | ICD-10-CM

## 2022-03-27 ENCOUNTER — Ambulatory Visit
Admission: RE | Admit: 2022-03-27 | Discharge: 2022-03-27 | Disposition: A | Discharge status: Home / Self Care | Payer: BC Managed Care – PPO | Source: Ambulatory Visit | Attending: Obstetrics and Gynecology | Admitting: Obstetrics and Gynecology

## 2022-03-27 DIAGNOSIS — N6489 Other specified disorders of breast: Secondary | ICD-10-CM

## 2022-03-27 DIAGNOSIS — R928 Other abnormal and inconclusive findings on diagnostic imaging of breast: Secondary | ICD-10-CM

## 2022-03-27 DIAGNOSIS — Z803 Family history of malignant neoplasm of breast: Secondary | ICD-10-CM | POA: Diagnosis not present

## 2022-03-29 ENCOUNTER — Encounter: Payer: Self-pay | Admitting: Emergency Medicine

## 2022-03-29 ENCOUNTER — Ambulatory Visit
Admission: EM | Admit: 2022-03-29 | Discharge: 2022-03-29 | Disposition: A | Discharge status: Home / Self Care | Payer: BC Managed Care – PPO | Attending: Family Medicine | Admitting: Family Medicine

## 2022-03-29 DIAGNOSIS — R21 Rash and other nonspecific skin eruption: Secondary | ICD-10-CM

## 2022-03-29 DIAGNOSIS — W57XXXA Bitten or stung by nonvenomous insect and other nonvenomous arthropods, initial encounter: Secondary | ICD-10-CM

## 2022-03-29 MED ORDER — PREDNISONE 10 MG PO TABS: ORAL_TABLET | ORAL | 0 refills | Status: DC

## 2022-03-29 NOTE — ED Provider Notes (Signed)
[facility]    CSN: [id number] Arrival date & time: 03/29/22  1318  History   Chief Complaint Chief Complaint  Patient presents with   Insect Bite    HPI  41 year old female presents for evaluation of the above.  Recent travel to [place].  States that she developed what she believes to be insect bites while she was there.  They have continued to worsen and spread since she has been home.  She has multiple areas on her lower extremities.  They are itchy and raised.  Erythematous as well.  No relieving factors.  No other associated symptoms.  No other complaints.  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   Family history of breast cancer 02/24/2022   Anemia of pregnancy 04/07/2018   Heartburn during pregnancy in second trimester 01/26/2018   History of cesarean delivery 10/06/2017   Breast mass, left    Abnormal mammogram of left breast     Past Surgical History:  Procedure Laterality Date   BREAST BIOPSY Left 08/13/2016   Procedure: left BREAST BIOPSY WITH NEEDLE LOCALIZATION left Moeller's gland excision;  Surgeon: Florene Glen, MD;  Location: [facility];  Service: General;  Laterality: Left;   BREAST EXCISIONAL BIOPSY Left 08/13/2016   CESAREAN SECTION  2007   HYSTEROSCOPY WITH D & C N/A 12/26/2020   Procedure: DILATATION AND CURETTAGE /HYSTEROSCOPY;  Surgeon: Malachy Mood, MD;  Location: [facility];  Service: Gynecology;  Laterality: N/A;   INTRAUTERINE DEVICE (IUD) INSERTION N/A 12/26/2020   Procedure: INTRAUTERINE DEVICE (IUD) INSERTION;  Surgeon: Malachy Mood, MD;  Location: [facility];  Service: Gynecology;  Laterality: N/A;   TUBAL LIGATION Bilateral 05/25/2018   Procedure: POST PARTUM TUBAL LIGATION;  Surgeon: Homero Fellers, MD;  Location: [facility];  Service: Gynecology;  Laterality: Bilateral;    OB History     Gravida  6   Para  5   Term  5   Preterm      AB  1   Living  5      SAB      IAB      Ectopic      Multiple  0   Live Births  5            Home Medications    Prior to Admission medications   Medication Sig Start Date End Date Taking? Authorizing Provider  hydrochlorothiazide (HYDRODIURIL) 25 MG tablet Take 1 tablet (25 mg total) by mouth daily. 02/26/21  Yes Malachy Mood, MD  levonorgestrel (MIRENA) 20 MCG/DAY IUD 1 each by Intrauterine route once.   Yes [provider]  predniSONE (DELTASONE) 10 MG tablet 50 mg daily x 2 days, then 40 mg daily x 2 days, then 30 mg daily x 2 days, then 20 mg daily x 2 days, then 10 mg daily x 2 days. 03/29/22  Yes Kerigan Narvaez G, DO  valACYclovir (VALTREX) 500 MG tablet Take 1 tablet (500 mg total) by mouth 2 (two) times daily. For 3 days prn sx 8/33/82   Copland, Deirdre Evener, PA-C    Family History Family History  Problem Relation Age of Onset   Hypertension Mother    Breast cancer Mother 11  Kidney disease Mother    Cirrhosis Father    Alcohol abuse Father    Breast cancer Sister 56    Social History Social History   Tobacco Use   Smoking status: Former    Packs/day: 0.25    Years: 16.00    Total pack years: 4.00    Types: Cigarettes    Quit date: 11/05/2019    Years since quitting: 2.3   Smokeless tobacco: Never  Vaping Use   Vaping Use: Every day   Substances: Nicotine, Flavoring  Substance Use Topics   Alcohol use: Yes    Comment: Occasional   Drug use: Never     Allergies   Tape   Review of Systems Review of Systems Per HPI  Physical Exam Triage Vital Signs ED Triage Vitals  Enc Vitals Group     BP 03/29/22 1415 (!) 123/90     Pulse Rate 03/29/22 1415 97     Resp 03/29/22 1415 14     Temp 03/29/22 1415 98.2 F (36.8 C)     Temp Source 03/29/22 1415 Oral     SpO2 03/29/22  1415 98 %     Weight 03/29/22 1413 245 lb (111.1 kg)     Height 03/29/22 1413 '5\' 7"'$  (1.702 m)     Head Circumference --      Peak Flow --      Pain Score 03/29/22 1413 0     Pain Loc --      Pain Edu? --      Excl. in [place]? --    Updated Vital Signs BP (!) 123/90 (BP Location: Right Arm)   Pulse 97   Temp 98.2 F (36.8 C) (Oral)   Resp 14   Ht '5\' 7"'$  (1.702 m)   Wt 111.1 kg   SpO2 98%   BMI 38.37 kg/m   Visual Acuity Right Eye Distance:   Left Eye Distance:   Bilateral Distance:    Right Eye Near:   Left Eye Near:    Bilateral Near:     Physical Exam Vitals and nursing note reviewed.  Constitutional:      Appearance: Normal appearance. She is obese.  HENT:     Head: Normocephalic and atraumatic.  Eyes:     General:        Right eye: No discharge.        Left eye: No discharge.     Conjunctiva/sclera: Conjunctivae normal.  Cardiovascular:     Rate and Rhythm: Normal rate and regular rhythm.  Pulmonary:     Effort: Pulmonary effort is normal.     Breath sounds: Normal breath sounds. No wheezing, rhonchi or rales.  Skin:    Comments: Bilateral lower extremities with raised papular lesions consistent with bug bites.  Neurological:     Mental Status: She is alert.  Psychiatric:        Mood and Affect: Mood normal.        Behavior: Behavior normal.      UC Treatments / Results  Labs (all labs ordered are listed, but only abnormal results are displayed) Labs Reviewed - No data to display  EKG   Radiology No results found.  Procedures Procedures (including critical care time)  Medications Ordered in UC Medications - No data to display  Initial Impression / Assessment and Plan / UC Course  I have reviewed the triage vital signs and the nursing notes.  Pertinent labs & imaging results that were available during my care  of the patient were reviewed by me and considered in my medical decision making (see chart for details).    41 year old female  presents with rash/insect bites. Treating with prednisone.   Final Clinical Impressions(s) / UC Diagnoses   Final diagnoses:  Insect bite, unspecified site, initial encounter  Rash     Discharge Instructions      Medication as prescribed.   If persists, please let your PCP know.  Take care  Dr. Lacinda Axon    ED Prescriptions     Medication Sig Dispense Auth. Provider   predniSONE (DELTASONE) 10 MG tablet 50 mg daily x 2 days, then 40 mg daily x 2 days, then 30 mg daily x 2 days, then 20 mg daily x 2 days, then 10 mg daily x 2 days. 30 tablet Coral Spikes, DO      PDMP not reviewed this encounter.   Coral Spikes, [place] 03/29/22 1630

## 2022-03-29 NOTE — Discharge Instructions (Signed)
Medication as prescribed.   If persists, please let your PCP know.  Take care  Dr. Lacinda Axon

## 2022-03-29 NOTE — ED Triage Notes (Signed)
Patient recently traveled to [place].  Patient states that she first noticed insect bites on her right lower leg on Monday and since then has spread to her other leg and arms.

## 2022-09-30 ENCOUNTER — Telehealth: Payer: Self-pay

## 2022-09-30 NOTE — Telephone Encounter (Signed)
Rx sent by DR. Summit in 2022. We didn't discuss her BP mgmt at her 8/23 appt. Why does she just now need HCTZ RF? Thx

## 2022-09-30 NOTE — Telephone Encounter (Signed)
Request from [facility] for refill of Hydrochlorothiazide 25mg  tab received.

## 2022-10-01 ENCOUNTER — Other Ambulatory Visit: Payer: Self-pay | Admitting: Obstetrics and Gynecology

## 2022-10-01 MED ORDER — HYDROCHLOROTHIAZIDE 25 MG PO TABS: 25.0000 mg | ORAL_TABLET | Freq: Every day | ORAL | 0 refills | Status: DC

## 2022-10-01 NOTE — Telephone Encounter (Signed)
Pt aware.

## 2022-10-01 NOTE — Telephone Encounter (Signed)
Rx HCTZ #30 eRxd. If needs RF soon, then needs PCP eval. Will f/u at 8/24 annual

## 2022-10-01 NOTE — Progress Notes (Signed)
Rx RF HCTZ prn LE edema, no longer taking for BP/not needed for BP. If sx persist, needs PCP eval. F/u at 8/24 annual

## 2022-10-23 ENCOUNTER — Other Ambulatory Visit: Payer: Self-pay | Admitting: Obstetrics and Gynecology

## 2022-12-24 ENCOUNTER — Encounter: Payer: Self-pay | Admitting: Obstetrics and Gynecology

## 2023-01-27 ENCOUNTER — Encounter: Payer: Self-pay | Admitting: Oncology

## 2023-02-03 ENCOUNTER — Ambulatory Visit: Payer: 59 | Admitting: Nurse Practitioner

## 2023-03-04 ENCOUNTER — Encounter: Payer: Self-pay | Admitting: Oncology

## 2023-03-05 ENCOUNTER — Other Ambulatory Visit: Payer: Self-pay

## 2023-03-05 ENCOUNTER — Encounter: Payer: Self-pay | Admitting: Nurse Practitioner

## 2023-03-05 ENCOUNTER — Ambulatory Visit (INDEPENDENT_AMBULATORY_CARE_PROVIDER_SITE_OTHER): Payer: BC Managed Care – PPO | Admitting: Nurse Practitioner

## 2023-03-05 ENCOUNTER — Other Ambulatory Visit (HOSPITAL_COMMUNITY)
Admission: RE | Admit: 2023-03-05 | Discharge: 2023-03-05 | Disposition: A | Discharge status: Home / Self Care | Payer: BC Managed Care – PPO | Source: Ambulatory Visit | Attending: Nurse Practitioner | Admitting: Nurse Practitioner

## 2023-03-05 VITALS — BP 138/98 | HR 93 | Temp 97.9°F | Resp 16 | Ht 67.0 in | Wt 252.1 lb

## 2023-03-05 DIAGNOSIS — N898 Other specified noninflammatory disorders of vagina: Secondary | ICD-10-CM | POA: Insufficient documentation

## 2023-03-05 DIAGNOSIS — R600 Localized edema: Secondary | ICD-10-CM

## 2023-03-05 DIAGNOSIS — Z13220 Encounter for screening for lipoid disorders: Secondary | ICD-10-CM | POA: Diagnosis not present

## 2023-03-05 DIAGNOSIS — Z131 Encounter for screening for diabetes mellitus: Secondary | ICD-10-CM

## 2023-03-05 DIAGNOSIS — Z1159 Encounter for screening for other viral diseases: Secondary | ICD-10-CM

## 2023-03-05 DIAGNOSIS — I10 Essential (primary) hypertension: Secondary | ICD-10-CM

## 2023-03-05 DIAGNOSIS — E049 Nontoxic goiter, unspecified: Secondary | ICD-10-CM | POA: Diagnosis present

## 2023-03-05 DIAGNOSIS — E66812 Obesity, class 2: Secondary | ICD-10-CM | POA: Insufficient documentation

## 2023-03-05 DIAGNOSIS — Z7689 Persons encountering health services in other specified circumstances: Secondary | ICD-10-CM

## 2023-03-05 DIAGNOSIS — Z130 Encounter for screening for diseases of the blood and blood-forming organs and certain disorders involving the immune mechanism: Secondary | ICD-10-CM

## 2023-03-05 DIAGNOSIS — Z6839 Body mass index (BMI) 39.0-39.9, adult: Secondary | ICD-10-CM

## 2023-03-05 DIAGNOSIS — Z803 Family history of malignant neoplasm of breast: Secondary | ICD-10-CM

## 2023-03-05 MED ORDER — HYDROCHLOROTHIAZIDE 25 MG PO TABS
25.0000 mg | ORAL_TABLET | Freq: Every day | ORAL | 0 refills | Status: DC
Start: 2023-03-05 — End: 2023-04-02

## 2023-03-05 MED ORDER — ZEPBOUND 2.5 MG/0.5ML ~~LOC~~ SOAJ: 2.5000 mg | SUBCUTANEOUS | 0 refills | Status: DC

## 2023-03-05 MED ORDER — LOSARTAN POTASSIUM 25 MG PO TABS: 25.0000 mg | ORAL_TABLET | Freq: Every day | ORAL | 0 refills | Status: DC

## 2023-03-05 NOTE — Assessment & Plan Note (Signed)
Referral placed to genetics for testing.

## 2023-03-05 NOTE — Progress Notes (Signed)
BP (!) 138/98   Pulse 93   Temp 97.9 F (36.6 C) (Oral)   Resp 16   Ht 5\' 7"  (1.702 m)   Wt 252 lb 1.6 oz (114.4 kg)   SpO2 98%   BMI 39.48 kg/m    Subjective:    Patient ID: Danielle Romero, female    DOB: 08-05-1980, 42 y.o.   MRN: [id number]  HPI: Danielle Romero is a 42 y.o. female  Chief Complaint  Patient presents with   Establish Care   Obesity   Hypertension   Vaginitis   Establish care: her last physical was she is not sure, maybe within the last year.  Medical history includes htn, obesity, goiter during pregnancy.  Family history includes breast cancer, kidney disease, cirrhosis, cva, dm.  Health maintenance due for labs. Up to date on mammogram and pap  Hypertension:  -Medications: hydrochlorothiazide 25 mg daily -Patient is compliant with above medications and reports no side effects. -Checking BP at home (average): 120-150s Today's blood pressure 144/100 -Denies any SOB, CP, vision changes, LE edema or symptoms of hypotension -Diet: recommend DASH diet  -Exercise: recommend 150 min of physical activity weekly    - will start losartan    Obesity:  Current weight : 252 lbs BMI: 39.48 Highest weight:264 lbs Treatment Tried: lifestyle modification, multiple different diets, phentermine Comorbidities: HTN  No family history thyroid cancer,  no personal history of pancreatis.   Lower extremity edema:  she has had lower extremity edema for about three years.   She was started on hydrochlorothiazide.  She says initially it worked but it is not working anymore.  She reports it is worse in the afternoon. Getting labs.  May consider starting lasix in afternoon for edema.    Vaginal discharge:  reports she has had it for about a week, yellow color. Will get vaginal swab and treat as indicated.   Relevant past medical, surgical, family and social history reviewed and updated as indicated. Interim medical history since our last visit reviewed. Allergies and  medications reviewed and updated.  Review of Systems  Constitutional: Negative for fever or weight change.  Respiratory: Negative for cough and shortness of breath.   Cardiovascular: Negative for chest pain or palpitations.  Gastrointestinal: Negative for abdominal pain, no bowel changes.  Musculoskeletal: Negative for gait problem or joint swelling.  Skin: Negative for rash.  Neurological: Negative for dizziness or headache.  No other specific complaints in a complete review of systems (except as listed in HPI above).      Objective:    BP (!) 138/98   Pulse 93   Temp 97.9 F (36.6 C) (Oral)   Resp 16   Ht 5\' 7"  (1.702 m)   Wt 252 lb 1.6 oz (114.4 kg)   SpO2 98%   BMI 39.48 kg/m   Wt Readings from Last 3 Encounters:  03/05/23 252 lb 1.6 oz (114.4 kg)  03/29/22 245 lb (111.1 kg)  02/24/22 250 lb (113.4 kg)    Physical Exam  Constitutional: Patient appears well-developed and well-nourished. Obese  No distress.  HEENT: head atraumatic, normocephalic, pupils equal and reactive to light, neck supple Cardiovascular: Normal rate, regular rhythm and normal heart sounds.  No murmur heard. No BLE edema. Pulmonary/Chest: Effort normal and breath sounds normal. No respiratory distress. Abdominal: Soft.  There is no tenderness. Psychiatric: Patient has a normal mood and affect. behavior is normal. Judgment and thought content normal.      Assessment &  Plan:   Problem List Items Addressed This Visit       Cardiovascular and Mediastinum   Primary hypertension - Primary    Continue hydrochlorothiazide 25 mg daily, start losartan 25 mg daily.  Send blood pressure readings via mychart in one week. Follow up in 4 weeks.       Relevant Medications   losartan (COZAAR) 25 MG tablet   hydrochlorothiazide (HYDRODIURIL) 25 MG tablet   Other Relevant Orders   CBC with Differential/Platelet   COMPLETE METABOLIC PANEL WITH GFR     Other   Family history of breast cancer    Referral  placed to genetics for testing.       Relevant Orders   Ambulatory referral to Genetics   Class 2 severe obesity due to excess calories with serious comorbidity and body mass index (BMI) of 39.0 to 39.9 in adult [facility])    Continue working on lifestyle modification,  will try to get approved for zepbound.       Relevant Medications   tirzepatide (ZEPBOUND) 2.5 MG/0.5ML Pen   Other Relevant Orders   TSH   Lower extremity edema    Getting labs today,  consider starting lasix       Relevant Orders   B Nat Peptide   Other Visit Diagnoses     Encounter to establish care       Screening for diabetes mellitus       Relevant Orders   COMPLETE METABOLIC PANEL WITH GFR   Hemoglobin A1c   Screening for deficiency anemia       Relevant Orders   CBC with Differential/Platelet   Screening for cholesterol level       Relevant Orders   Lipid panel   Encounter for hepatitis C screening test for low risk patient       Relevant Orders   Hepatitis C antibody   Goiter       history of goiter, will get tsh   Relevant Orders   Cervicovaginal ancillary only        Follow up plan: Return in about 4 weeks (around 04/02/2023) for follow up.

## 2023-03-05 NOTE — Assessment & Plan Note (Signed)
Getting labs today,  consider starting lasix

## 2023-03-05 NOTE — Assessment & Plan Note (Signed)
Continue working on lifestyle modification,  will try to get approved for zepbound.

## 2023-03-05 NOTE — Assessment & Plan Note (Signed)
Continue hydrochlorothiazide 25 mg daily, start losartan 25 mg daily.  Send blood pressure readings via mychart in one week. Follow up in 4 weeks.

## 2023-03-06 LAB — BRAIN NATRIURETIC PEPTIDE: Brain Natriuretic Peptide: 13 pg/mL (ref <100)

## 2023-03-06 LAB — CBC WITH DIFFERENTIAL/PLATELET
Absolute Monocytes: 576 {cells}/uL (ref 200–950)
Basophils Absolute: 32 {cells}/uL (ref 0–200)
Basophils Relative: 0.4 %
Eosinophils Absolute: 80 {cells}/uL (ref 15–500)
Eosinophils Relative: 1 %
HCT: 39.3 % (ref 35.0–45.0)
Hemoglobin: 13 g/dL (ref 11.7–15.5)
Lymphs Abs: 2232 {cells}/uL (ref 850–3900)
MCH: 26.6 pg — ABNORMAL LOW (ref 27.0–33.0)
MCHC: 33.1 g/dL (ref 32.0–36.0)
MCV: 80.5 fL (ref 80.0–100.0)
MPV: 11.2 fL (ref 7.5–12.5)
Monocytes Relative: 7.2 %
Neutro Abs: 5080 {cells}/uL (ref 1500–7800)
Neutrophils Relative %: 63.5 %
Platelets: 314 10*3/uL (ref 140–400)
RBC: 4.88 10*6/uL (ref 3.80–5.10)
RDW: 13.5 % (ref 11.0–15.0)
Total Lymphocyte: 27.9 %
WBC: 8 10*3/uL (ref 3.8–10.8)

## 2023-03-06 LAB — COMPLETE METABOLIC PANEL WITH GFR
AG Ratio: 1.4 (calc) (ref 1.0–2.5)
ALT: 20 U/L (ref 6–29)
AST: 16 U/L (ref 10–30)
Albumin: 4.2 g/dL (ref 3.6–5.1)
Alkaline phosphatase (APISO): 113 U/L (ref 31–125)
BUN: 11 mg/dL (ref 7–25)
CO2: 25 mmol/L (ref 20–32)
Calcium: 9.1 mg/dL (ref 8.6–10.2)
Chloride: 105 mmol/L (ref 98–110)
Creat: 0.61 mg/dL (ref 0.50–0.99)
Globulin: 3.1 g/dL (ref 1.9–3.7)
Glucose, Bld: 85 mg/dL (ref 65–99)
Potassium: 3.8 mmol/L (ref 3.5–5.3)
Sodium: 140 mmol/L (ref 135–146)
Total Bilirubin: 0.4 mg/dL (ref 0.2–1.2)
Total Protein: 7.3 g/dL (ref 6.1–8.1)
eGFR: 114 mL/min/{1.73_m2} (ref >=60)

## 2023-03-06 LAB — LIPID PANEL
Cholesterol: 167 mg/dL (ref <200)
HDL: 47 mg/dL — ABNORMAL LOW (ref >=50)
LDL Cholesterol (Calc): 102 mg/dL — ABNORMAL HIGH
Non-HDL Cholesterol (Calc): 120 mg/dL (ref <130)
Total CHOL/HDL Ratio: 3.6 (calc) (ref <5.0)
Triglycerides: 86 mg/dL (ref <150)

## 2023-03-06 LAB — HEMOGLOBIN A1C
Hgb A1c MFr Bld: 5.6 %{Hb} (ref <5.7)
Mean Plasma Glucose: 114 mg/dL
eAG (mmol/L): 6.3 mmol/L

## 2023-03-06 LAB — TSH: TSH: 0.91 m[IU]/L

## 2023-03-06 LAB — HEPATITIS C ANTIBODY: Hepatitis C Ab: NONREACTIVE

## 2023-03-08 ENCOUNTER — Other Ambulatory Visit: Payer: Self-pay | Admitting: Nurse Practitioner

## 2023-03-08 DIAGNOSIS — R600 Localized edema: Secondary | ICD-10-CM

## 2023-03-08 LAB — CERVICOVAGINAL ANCILLARY ONLY
Bacterial Vaginitis (gardnerella): NEGATIVE
Candida Glabrata: NEGATIVE
Candida Vaginitis: NEGATIVE
Chlamydia: NEGATIVE
Comment: NEGATIVE
Comment: NEGATIVE
Comment: NEGATIVE
Comment: NEGATIVE
Comment: NEGATIVE
Comment: NORMAL
Neisseria Gonorrhea: NEGATIVE
Trichomonas: NEGATIVE

## 2023-03-08 MED ORDER — FUROSEMIDE 20 MG PO TABS
20.0000 mg | ORAL_TABLET | Freq: Every day | ORAL | 0 refills | Status: DC
Start: 2023-03-08 — End: 2023-04-01

## 2023-03-17 ENCOUNTER — Other Ambulatory Visit: Payer: Self-pay

## 2023-03-17 ENCOUNTER — Encounter: Payer: Self-pay | Admitting: Oncology

## 2023-03-17 ENCOUNTER — Ambulatory Visit
Admission: EM | Admit: 2023-03-17 | Discharge: 2023-03-17 | Discharge status: Against Medical Advice | Payer: BC Managed Care – PPO | Attending: Family Medicine | Admitting: Family Medicine

## 2023-03-17 ENCOUNTER — Encounter: Payer: Self-pay | Admitting: Nurse Practitioner

## 2023-03-17 DIAGNOSIS — M545 Low back pain, unspecified: Secondary | ICD-10-CM | POA: Diagnosis not present

## 2023-03-17 DIAGNOSIS — Z5321 Procedure and treatment not carried out due to patient leaving prior to being seen by health care provider: Secondary | ICD-10-CM | POA: Diagnosis not present

## 2023-03-17 LAB — URINALYSIS, W/ REFLEX TO CULTURE (INFECTION SUSPECTED)
Bilirubin Urine: NEGATIVE
Glucose, UA: NEGATIVE mg/dL
Hgb urine dipstick: NEGATIVE
Ketones, ur: NEGATIVE mg/dL
Leukocytes,Ua: NEGATIVE
Nitrite: NEGATIVE
Protein, ur: NEGATIVE mg/dL
RBC / HPF: NONE SEEN RBC/hpf (ref 0–5)
Specific Gravity, Urine: 1.015 (ref 1.005–1.030)
pH: 6 (ref 5.0–8.0)

## 2023-03-17 NOTE — ED Triage Notes (Signed)
Low back pain and pt is concerned that she has a UTI. Denies injuries and fever

## 2023-03-23 ENCOUNTER — Inpatient Hospital Stay: Payer: BC Managed Care – PPO

## 2023-03-23 ENCOUNTER — Inpatient Hospital Stay: Payer: BC Managed Care – PPO | Attending: Oncology | Admitting: Licensed Clinical Social Worker

## 2023-03-23 ENCOUNTER — Encounter: Payer: Self-pay | Admitting: Licensed Clinical Social Worker

## 2023-03-23 DIAGNOSIS — Z800 Family history of malignant neoplasm of digestive organs: Secondary | ICD-10-CM | POA: Diagnosis not present

## 2023-03-23 DIAGNOSIS — Z803 Family history of malignant neoplasm of breast: Secondary | ICD-10-CM

## 2023-03-23 NOTE — Progress Notes (Signed)
REFERRING PROVIDER: Berniece Salines, FNP [address] [place],  [place] [address]  PRIMARY PROVIDER:  Berniece Salines, FNP  PRIMARY REASON FOR VISIT:  1. Family history of breast cancer      HISTORY OF PRESENT ILLNESS:   Danielle Romero, a 42 y.o. female, was seen for a [place] cancer genetics consultation at the request of Danielle Goo, FNP due to a family history of breast cancer.  Danielle Romero presents to clinic today to discuss the possibility of a hereditary predisposition to cancer, genetic testing, and to further clarify her future cancer risks, as well as potential cancer risks for family members.    CANCER HISTORY:  Danielle Romero is a 42 y.o. female with no personal history of cancer.    RISK FACTORS:  Menarche was at age 54.  First live birth at age 13.  Ovaries intact: yes.  Hysterectomy: no.  Menopausal status: premenopausal.  Colonoscopy: n/a Mammogram within the last year: yes. Number of breast biopsies: had breast mass removed, noncancerous Up to date with pelvic exams: yes.  Past Medical History:  Diagnosis Date   Breast mass    Bronchitis    GERD (gastroesophageal reflux disease)    NO MEDS   Hypertension    Obesity (BMI 30-39.9)     Past Surgical History:  Procedure Laterality Date   BREAST BIOPSY Left 08/13/2016   Procedure: left BREAST BIOPSY WITH NEEDLE LOCALIZATION left Moeller's gland excision;  Surgeon: Lattie Haw, MD;  Location: [facility];  Service: General;  Laterality: Left;   BREAST EXCISIONAL BIOPSY Left 08/13/2016   CESAREAN SECTION  2007   HYSTEROSCOPY WITH D & C N/A 12/26/2020   Procedure: DILATATION AND CURETTAGE /HYSTEROSCOPY;  Surgeon: Vena Austria, MD;  Location: [facility];  Service: Gynecology;  Laterality: N/A;   INTRAUTERINE DEVICE (IUD) INSERTION N/A 12/26/2020   Procedure: INTRAUTERINE DEVICE (IUD) INSERTION;  Surgeon: Vena Austria, MD;  Location: [facility];  Service: Gynecology;  Laterality: N/A;   TUBAL  LIGATION Bilateral 05/25/2018   Procedure: POST PARTUM TUBAL LIGATION;  Surgeon: Natale Milch, MD;  Location: [facility];  Service: Gynecology;  Laterality: Bilateral;    FAMILY HISTORY:  We obtained a detailed, 4-generation family history.  Significant diagnoses are listed below: Family History  Problem Relation Age of Onset   Hypertension Mother    Breast cancer Mother 71   Kidney disease Mother    Cirrhosis Father    Alcohol abuse Father    Breast cancer Sister 66       dx at 85 and 71, in right + left breasts   Throat cancer Maternal Aunt    Colon cancer Paternal Uncle        dx over age 27   Colon cancer Cousin        dx under age 53    Danielle Romero has 2 sons and 3 daughters, the eldest is 75 and youngest is 4. None have had cancer. She has a paternal half sister and brother, the sister has had breast cancer twice, once at 65 and again at 50. She has 2 maternal half brothers as well.   Danielle Romero mother had breast cancer in her 52s and is living at 65. Maternal aunt had throat cancer. No other known cancers on this side of the family.  Danielle Romero father died at 51. A paternal uncle had colon cancer over age 41, and his son died of colon cancer under age 81.  [name] is unaware of previous family history of genetic testing for hereditary cancer risks. There is no reported Ashkenazi Jewish ancestry. There is no known consanguinity.    GENETIC COUNSELING ASSESSMENT: Danielle Romero is a 42 y.o. female with a family history of breast cancer which is somewhat suggestive of a hereditary cancer syndrome and predisposition to cancer. We, therefore, discussed and recommended the following at today's visit.   DISCUSSION: We discussed that approximately 10% of breast cancer is hereditary. Most cases of hereditary breast cancer are associated with BRCA1/BRCA2 genes, although there are other genes associated with hereditary cancer as well, including genes associated with colon  cancer. Cancers and risks are gene specific. We discussed that testing is beneficial for several reasons including knowing about cancer risks, identifying potential screening and risk-reduction options that may be appropriate, and to understand if other family members could be at risk for cancer and allow them to undergo genetic testing.   We reviewed the characteristics, features and inheritance patterns of hereditary cancer syndromes. We also discussed genetic testing, including the appropriate family members to test, the process of testing, insurance coverage and turn-around-time for results. We discussed the implications of a negative, positive and/or variant of uncertain significant result. We recommended Danielle Romero pursue genetic testing for the [facility] CancerNext+RNA gene panel.   Based on Danielle Romero's family history of cancer, she meets medical criteria for genetic testing. Despite that she meets criteria, she may still have an out of pocket cost.   PLAN: After considering the risks, benefits, and limitations, Danielle Romero provided informed consent to pursue genetic testing and the blood sample was sent to [facility] for analysis of the CancerNext+RNA panel. Results should be available within approximately 2-3 weeks' time, at which point they will be disclosed by telephone to Danielle Romero, as will any additional recommendations warranted by these results. Danielle Romero will receive a summary of her genetic counseling visit and a copy of her results once available. This information will also be available in Epic.   Danielle Romero questions were answered to her satisfaction today. Our contact information was provided should additional questions or concerns arise. Thank you for the referral and allowing [place] to share in the care of your patient.   Danielle Duverney, MS, [facility] Genetic Counselor [email or web address] Phone: [phone]  The patient was seen for a total of 20 minutes in face-to-face  genetic counseling.  Dr. Blake Divine was available for discussion regarding this case.   _______________________________________________________________________ For Office Staff:  Number of people involved in session: 1 Was an Intern/ student involved with case: no

## 2023-03-26 ENCOUNTER — Encounter: Payer: Self-pay | Admitting: Pharmacist

## 2023-03-27 ENCOUNTER — Other Ambulatory Visit: Payer: Self-pay | Admitting: Nurse Practitioner

## 2023-03-27 DIAGNOSIS — I10 Essential (primary) hypertension: Secondary | ICD-10-CM

## 2023-03-29 NOTE — Telephone Encounter (Signed)
Requested Prescriptions  Pending Prescriptions Disp Refills   losartan (COZAAR) 25 MG tablet [Pharmacy Med Name: LOSARTAN POTASSIUM 25 MG TAB] 90 tablet 1    Sig: TAKE 1 TABLET (25 MG TOTAL) BY MOUTH DAILY.     Cardiovascular:  Angiotensin Receptor Blockers Passed - 03/27/2023  2:33 PM      Passed - Cr in normal range and within 180 days    Creat  Date Value Ref Range Status  03/05/2023 0.61 0.50 - 0.99 mg/dL Final         Passed - K in normal range and within 180 days    Potassium  Date Value Ref Range Status  03/05/2023 3.8 3.5 - 5.3 mmol/L Final         Passed - Patient is not pregnant      Passed - Last BP in normal range    BP Readings from Last 1 Encounters:  03/17/23 136/85         Passed - Valid encounter within last 6 months    Recent Outpatient Visits           3 weeks ago Primary hypertension   [facility] Berniece Salines, FNP       Future Appointments             In 4 days Zane Herald, Rudolpho Sevin, FNP [facility], [facility]

## 2023-03-30 ENCOUNTER — Other Ambulatory Visit: Payer: Self-pay | Admitting: Nurse Practitioner

## 2023-03-30 DIAGNOSIS — R600 Localized edema: Secondary | ICD-10-CM

## 2023-03-31 NOTE — Telephone Encounter (Signed)
Requested medication (s) are due for refill today: Yes  Requested medication (s) are on the active medication list: Yes  Last refill:  03/08/23  Future visit scheduled: Yes  Notes to clinic:  Unable to refill per protocol due to diagnosis code needed. Pharmacy requesting 90 day supply     Requested Prescriptions  Pending Prescriptions Disp Refills   furosemide (LASIX) 20 MG tablet [Pharmacy Med Name: FUROSEMIDE 20 MG TABLET] 90 tablet 1    Sig: TAKE 1 TABLET BY MOUTH EVERY DAY     Cardiovascular:  Diuretics - Loop Failed - 03/30/2023  2:32 PM      Failed - Mg Level in normal range and within 180 days    No results found for: "MG"       Passed - K in normal range and within 180 days    Potassium  Date Value Ref Range Status  03/05/2023 3.8 3.5 - 5.3 mmol/L Final         Passed - Ca in normal range and within 180 days    Calcium  Date Value Ref Range Status  03/05/2023 9.1 8.6 - 10.2 mg/dL Final         Passed - Na in normal range and within 180 days    Sodium  Date Value Ref Range Status  03/05/2023 140 135 - 146 mmol/L Final         Passed - Cr in normal range and within 180 days    Creat  Date Value Ref Range Status  03/05/2023 0.61 0.50 - 0.99 mg/dL Final         Passed - Cl in normal range and within 180 days    Chloride  Date Value Ref Range Status  03/05/2023 105 98 - 110 mmol/L Final         Passed - Last BP in normal range    BP Readings from Last 1 Encounters:  03/17/23 136/85         Passed - Valid encounter within last 6 months    Recent Outpatient Visits           3 weeks ago Primary hypertension   [facility] Berniece Salines, FNP       Future Appointments             In 2 days Berniece Salines, FNP [facility], [facility]

## 2023-04-01 ENCOUNTER — Other Ambulatory Visit: Payer: Self-pay | Admitting: Nurse Practitioner

## 2023-04-01 DIAGNOSIS — I10 Essential (primary) hypertension: Secondary | ICD-10-CM

## 2023-04-01 NOTE — Progress Notes (Signed)
BP 128/78   Pulse 98   Temp 98.1 F (36.7 C) (Oral)   Resp 16   Ht 5\' 7"  (1.702 m)   Wt 252 lb (114.3 kg)   SpO2 99%   BMI 39.47 kg/m    Subjective:    Patient ID: Danielle Romero, female    DOB: Oct 25, 1980, 42 y.o.   MRN: [id number]  HPI: Danielle Romero is a 42 y.o. female  Chief Complaint  Patient presents with   Hypertension    4 week follow up   Hypertension:  -Medications: losartan 25 mg daily, hydrochlorothiazide 25 mg daily, lasix 20 mg ( as needed in afternoon for edema) -Patient is compliant with above medications and reports no side effects. -Checking BP at home (average): 130s -Denies any SOB, CP, vision changes,  or symptoms of hypotension Lower extremity edema has gotten better -Diet: recommend DASH diet  -Exercise: recommend 150 min of physical activity weekly        04/02/2023    7:38 AM 03/17/2023   11:29 AM 03/05/2023    8:51 AM  Vitals with BMI  Height 5\' 7"     Weight 252 lbs    BMI 39.46    Systolic 128 136 409  Diastolic 78 85 98  Pulse 98 86      insomnia: she reports she has not been sleeping well, she says she gets irritated easily, and that her mind zones out in the middle of conversation She was previously on a medication but does not recall the name, she reports it was a long time ago. She says that if she slept better her mood would probably be better.  She says she does drink a lot of caffeine.  Discussed decreasing caffeine before bed.  Will trial hydroxyzine Medication nothing currently Compliant n/a Side effects n/a PHQ9 positive GAD negative     04/02/2023    7:42 AM 03/05/2023    8:14 AM  Depression screen PHQ 2/9  Decreased Interest 1 0  Down, Depressed, Hopeless 1 0  PHQ - 2 Score 2 0  Altered sleeping 2   Tired, decreased energy 2   Change in appetite 0   Feeling bad or failure about yourself  2   Trouble concentrating 0   Moving slowly or fidgety/restless 0   Suicidal thoughts 0   PHQ-9 Score 8   Difficult  doing work/chores Not difficult at all        04/02/2023    7:47 AM  GAD 7 : Generalized Anxiety Score  Nervous, Anxious, on Edge 0  Control/stop worrying 1  Worry too much - different things 1  Trouble relaxing 0  Restless 0  Easily annoyed or irritable 1  Afraid - awful might happen 0  Total GAD 7 Score 3  Anxiety Difficulty Not difficult at all     Relevant past medical, surgical, family and social history reviewed and updated as indicated. Interim medical history since our last visit reviewed. Allergies and medications reviewed and updated.  Review of Systems  Constitutional: Negative for fever or weight change.  Respiratory: Negative for cough and shortness of breath.   Cardiovascular: Negative for chest pain or palpitations.  Gastrointestinal: Negative for abdominal pain, no bowel changes.  Musculoskeletal: Negative for gait problem or joint swelling.  Skin: Negative for rash.  Neurological: Negative for dizziness or headache.  No other specific complaints in a complete review of systems (except as listed in HPI above).      Objective:  BP 128/78   Pulse 98   Temp 98.1 F (36.7 C) (Oral)   Resp 16   Ht 5\' 7"  (1.702 m)   Wt 252 lb (114.3 kg)   SpO2 99%   BMI 39.47 kg/m   Wt Readings from Last 3 Encounters:  04/02/23 252 lb (114.3 kg)  03/05/23 252 lb 1.6 oz (114.4 kg)  03/29/22 245 lb (111.1 kg)    Physical Exam  Constitutional: Patient appears well-developed and well-nourished. Obese  No distress.  HEENT: head atraumatic, normocephalic, pupils equal and reactive to light, neck supple, throat within normal limits Cardiovascular: Normal rate, regular rhythm and normal heart sounds.  No murmur heard. No BLE edema. Pulmonary/Chest: Effort normal and breath sounds normal. No respiratory distress. Abdominal: Soft.  There is no tenderness. Psychiatric: Patient has a normal mood and affect. behavior is normal. Judgment and thought content normal.       Assessment & Plan:   Problem List Items Addressed This Visit       Cardiovascular and Mediastinum   Primary hypertension - Primary    Continue  losartan 25 mg daily, hydrochlorothiazide 25 mg daily, and lasix as needed for swelling, will get labs at next appointment.       Relevant Medications   hydrochlorothiazide (HYDRODIURIL) 25 MG tablet   Other Visit Diagnoses     Other insomnia       start hydroxyazine, decrease caffiene. if no improvment in 4 weeks, schedule virtual to discuss other options.   Relevant Medications   hydrOXYzine (VISTARIL) 25 MG capsule        Follow up plan: Return in about 3 months (around 07/02/2023) for follow up, message in 30 days regarding sleep if no improvement schedule virtual.

## 2023-04-02 ENCOUNTER — Encounter: Payer: Self-pay | Admitting: Nurse Practitioner

## 2023-04-02 ENCOUNTER — Ambulatory Visit (INDEPENDENT_AMBULATORY_CARE_PROVIDER_SITE_OTHER): Payer: BC Managed Care – PPO | Admitting: Nurse Practitioner

## 2023-04-02 ENCOUNTER — Other Ambulatory Visit: Payer: Self-pay

## 2023-04-02 VITALS — BP 128/78 | HR 98 | Temp 98.1°F | Resp 16 | Ht 67.0 in | Wt 252.0 lb

## 2023-04-02 DIAGNOSIS — G4709 Other insomnia: Secondary | ICD-10-CM | POA: Diagnosis not present

## 2023-04-02 DIAGNOSIS — I10 Essential (primary) hypertension: Secondary | ICD-10-CM

## 2023-04-02 MED ORDER — HYDROCHLOROTHIAZIDE 25 MG PO TABS: 25.0000 mg | ORAL_TABLET | Freq: Every day | ORAL | 1 refills | Status: DC

## 2023-04-02 MED ORDER — HYDROXYZINE PAMOATE 25 MG PO CAPS
25.0000 mg | ORAL_CAPSULE | Freq: Every evening | ORAL | 0 refills | Status: DC | PRN
Start: 2023-04-02 — End: 2023-07-02

## 2023-04-02 NOTE — Telephone Encounter (Signed)
Requested Prescriptions  Refused Prescriptions Disp Refills   hydrochlorothiazide (HYDRODIURIL) 25 MG tablet [Pharmacy Med Name: HYDROCHLOROTHIAZIDE 25 MG TAB] 30 tablet 0    Sig: TAKE 1 TABLET (25 MG TOTAL) BY MOUTH DAILY.     Cardiovascular: Diuretics - Thiazide Passed - 04/01/2023  1:24 AM      Passed - Cr in normal range and within 180 days    Creat  Date Value Ref Range Status  03/05/2023 0.61 0.50 - 0.99 mg/dL Final         Passed - K in normal range and within 180 days    Potassium  Date Value Ref Range Status  03/05/2023 3.8 3.5 - 5.3 mmol/L Final         Passed - Na in normal range and within 180 days    Sodium  Date Value Ref Range Status  03/05/2023 140 135 - 146 mmol/L Final         Passed - Last BP in normal range    BP Readings from Last 1 Encounters:  04/02/23 128/78         Passed - Valid encounter within last 6 months    Recent Outpatient Visits           Today Primary hypertension   [facility] Berniece Salines, FNP   4 weeks ago Primary hypertension   [facility] Berniece Salines, FNP       Future Appointments             In 3 months Zane Herald, Rudolpho Sevin, FNP [facility], [facility]

## 2023-04-02 NOTE — Assessment & Plan Note (Signed)
Continue  losartan 25 mg daily, hydrochlorothiazide 25 mg daily, and lasix as needed for swelling, will get labs at next appointment.

## 2023-04-05 ENCOUNTER — Encounter: Payer: Self-pay | Admitting: Licensed Clinical Social Worker

## 2023-04-05 ENCOUNTER — Telehealth: Payer: Self-pay | Admitting: Licensed Clinical Social Worker

## 2023-04-05 ENCOUNTER — Ambulatory Visit: Payer: Self-pay | Admitting: Licensed Clinical Social Worker

## 2023-04-05 DIAGNOSIS — Z1379 Encounter for other screening for genetic and chromosomal anomalies: Secondary | ICD-10-CM

## 2023-04-05 NOTE — Progress Notes (Signed)
HPI:   [name] was previously seen in the [facility] due to a family history of cancer and concerns regarding a hereditary predisposition to cancer. Please refer to our prior cancer genetics clinic note for more information regarding our discussion, assessment and recommendations, at the time. [name] recent genetic test results were disclosed to her, as were recommendations warranted by these results. These results and recommendations are discussed in more detail below.  CANCER HISTORY:  Oncology History   No history exists.    FAMILY HISTORY:  We obtained a detailed, 4-generation family history.  Significant diagnoses are listed below: Family History  Problem Relation Age of Onset   Hypertension Mother    Breast cancer Mother 67   Kidney disease Mother    Cirrhosis Father    Alcohol abuse Father    Breast cancer Sister 45       dx at 23 and 71, in right + left breasts   Throat cancer Maternal Aunt    Colon cancer Paternal Uncle        dx over age 27   Colon cancer Cousin        dx under age 80    Ms. Nipple has 2 sons and 3 daughters, the eldest is 66 and youngest is 4. None have had cancer. She has a paternal half sister and brother, the sister has had breast cancer twice, once at 41 and again at 34. She has 2 maternal half brothers as well.    [name] mother had breast cancer in her 38s and is living at 34. Maternal aunt had throat cancer. No other known cancers on this side of the family.   [name] father died at 31. A paternal uncle had colon cancer over age 54, and his son died of colon cancer under age 59.   [name] is unaware of previous family history of genetic testing for hereditary cancer risks. There is no reported Ashkenazi Jewish ancestry. There is no known consanguinity.     GENETIC TEST RESULTS:  The [facility] CancerNext+RNA Panel found no pathogenic mutations.   The CancerNext+RNAinsight gene panel offered by Danielle Romero includes sequencing and rearrangement analysis for the following 34 genes: APC*, ATM*, AXIN2, BARD1, BMPR1A, BRCA1*, BRCA2*, BRIP1*, CDH1*, CDK4, CDKN2A, CHEK2*, DICER1, MLH1*, MSH2*, MSH3, MSH6*, MUTYH*, NBN, NF1*, NTHL1, PALB2*, PMS2*, PTEN*, RAD51C*, RAD51D*, RECQL, SMAD4, SMARCA4, STK11 and TP53* (sequencing and deletion/duplication); HOXB13, POLD1 and POLE (sequencing only); EPCAM and GREM1 (deletion/duplication only).  The test report has been scanned into EPIC and is located under the Molecular Pathology section of the Results Review tab.  A portion of the result report is included below for reference. Genetic testing reported out on 04/01/2023.     Even though a pathogenic variant was not identified, possible explanations for the cancer in the family may include: There may be no hereditary risk for cancer in the family. The cancers in [name] and/or her family may be sporadic/familial or due to other genetic and environmental factors. There may be a gene mutation in one of these genes that current testing methods cannot detect but that chance is small. There could be another gene that has not yet been discovered, or that we have not yet tested, that is responsible for the cancer diagnoses in the family.  It is also possible there is a hereditary cause for the cancer in the family that [name] did not inherit.  Therefore, it is important to  remain in touch with cancer genetics in the future so that we can continue to offer [name] the most up to date genetic testing.   ADDITIONAL GENETIC TESTING:  We discussed with [name] that her genetic testing was fairly extensive.  If there are additional relevant genes identified to increase cancer risk that can be analyzed in the future, we would be happy to discuss and coordinate this testing at that time.    CANCER SCREENING RECOMMENDATIONS:  [name] is considered negative (normal).  This means that we have not  identified a hereditary cause for her family history of cancer at this time.   An individual's cancer risk and medical management are not determined by genetic test results alone. Overall cancer risk assessment incorporates additional factors, including personal medical history, family history, and any available genetic information that may result in a personalized plan for cancer prevention and surveillance. Therefore, it is recommended she continue to follow the cancer management and screening guidelines provided by her  primary healthcare provider.  Based on the reported personal and family history, specific cancer screenings for [name] and her family include:   Breast Cancer Screening:  The Tyrer-Cuzick model is one of multiple prediction models developed to estimate an individual's lifetime risk of developing breast cancer. The Tyrer-Cuzick model is endorsed by the [employer] (NCCN). This model includes many risk factors such as family history, endogenous estrogen exposure, and benign breast disease. The calculation is highly-dependent on the accuracy of clinical data provided by the patient and can change over time. The Tyrer-Cuzick model may be repeated to reflect new information in her personal or family history in the future.    [name] is 22%.  For women with a greater than 20% lifetime risk of breast cancer, the NCCN recommends the following:    1.   Clinical encounter every 6-12 months to begin when identified as being at increased risk, but not before age 1    2.   Annual mammograms, tomosynthesis is recommended starting 10 years earlier than the youngest breast cancer diagnosis in the family or at age 13 (whichever comes first), but not before age 2     79.   Annual breast MRI starting 10 years earlier than the youngest breast cancer diagnosis in the family or at age 75 (whichever comes first), but not before age  38    RECOMMENDATIONS FOR FAMILY MEMBERS:   Since she did not inherit a identifiable mutation in a cancer predisposition gene included on this panel, her children could not have inherited a known mutation from her in one of these genes. Individuals in this family might be at some increased risk of developing cancer, over the general population risk, due to the family history of cancer.  Individuals in the family should notify their providers of the family history of cancer. We recommend women in this family have a yearly mammogram beginning at age 63, or 36 years younger than the earliest onset of cancer, an annual clinical breast exam, and perform monthly breast self-exams.  Family members should have colonoscopies by at age 30, or earlier, as recommended by their providers. Other members of the family may still carry a pathogenic variant in one of these genes that [name] did not inherit. Based on the family history, we recommend her paternal half sister, who was diagnosed with breast cancer at age 28 and 52, have genetic counseling and testing.  [name] will let us know if we can be of any assistance in coordinating genetic counseling and/or testing for this family member.    FOLLOW-UP:  Lastly, we discussed with [name] that cancer genetics is a rapidly advancing field and it is possible that new genetic tests will be appropriate for her and/or her family members in the future. We encouraged her to remain in contact with cancer genetics on an annual basis so we can update her personal and family histories and let her know of advances in cancer genetics that may benefit this family.   Our contact number was provided. [name] questions were answered to her satisfaction, and she knows she is welcome to call us at anytime with additional questions or concerns.    Lacy Duverney, MS, [facility] Genetic Counselor [email or web address] Phone: [phone]

## 2023-04-05 NOTE — Telephone Encounter (Signed)
I contacted Ms. Williams to discuss her genetic testing results. No pathogenic variants were identified in the 34 genes analyzed. Detailed clinic note to follow.   The test report has been scanned into EPIC and is located under the Molecular Pathology section of the Results Review tab.  A portion of the result report is included below for reference.      Lacy Duverney, MS, [facility] Genetic Counselor [email or web address] Phone: [phone]

## 2023-04-24 ENCOUNTER — Other Ambulatory Visit: Payer: Self-pay | Admitting: Nurse Practitioner

## 2023-04-24 DIAGNOSIS — G4709 Other insomnia: Secondary | ICD-10-CM

## 2023-04-26 NOTE — Telephone Encounter (Signed)
Requested Prescriptions  Refused Prescriptions Disp Refills   hydrOXYzine (VISTARIL) 25 MG capsule [Pharmacy Med Name: HYDROXYZINE PAM 25 MG CAP] 90 capsule 1    Sig: TAKE 1 CAPSULE (25 MG TOTAL) BY MOUTH AT BEDTIME AS NEEDED (INSOMNIA).     Ear, Nose, and Throat:  Antihistamines 2 Passed - 04/24/2023  9:30 AM      Passed - Cr in normal range and within 360 days    Creat  Date Value Ref Range Status  03/05/2023 0.61 0.50 - 0.99 mg/dL Final         Passed - Valid encounter within last 12 months    Recent Outpatient Visits           3 weeks ago Primary hypertension   [facility] Berniece Salines, FNP   1 month ago Primary hypertension   [facility] Berniece Salines, FNP       Future Appointments             In 2 months Zane Herald, Rudolpho Sevin, FNP [facility], [facility]

## 2023-04-27 ENCOUNTER — Other Ambulatory Visit: Payer: Self-pay | Admitting: Obstetrics and Gynecology

## 2023-04-27 DIAGNOSIS — Z1231 Encounter for screening mammogram for malignant neoplasm of breast: Secondary | ICD-10-CM

## 2023-05-11 ENCOUNTER — Ambulatory Visit
Admission: RE | Admit: 2023-05-11 | Discharge: 2023-05-11 | Disposition: A | Discharge status: Home / Self Care | Payer: BC Managed Care – PPO | Source: Ambulatory Visit | Attending: Obstetrics and Gynecology | Admitting: Obstetrics and Gynecology

## 2023-05-11 DIAGNOSIS — Z1231 Encounter for screening mammogram for malignant neoplasm of breast: Secondary | ICD-10-CM | POA: Diagnosis not present

## 2023-05-14 ENCOUNTER — Encounter: Payer: Self-pay | Admitting: Nurse Practitioner

## 2023-06-24 ENCOUNTER — Ambulatory Visit
Admission: EM | Admit: 2023-06-24 | Discharge: 2023-06-24 | Disposition: A | Discharge status: Home / Self Care | Payer: BC Managed Care – PPO | Attending: Emergency Medicine | Admitting: Emergency Medicine

## 2023-06-24 DIAGNOSIS — B3741 Candidal cystitis and urethritis: Secondary | ICD-10-CM | POA: Diagnosis present

## 2023-06-24 LAB — URINALYSIS, W/ REFLEX TO CULTURE (INFECTION SUSPECTED)
Bilirubin Urine: NEGATIVE
Glucose, UA: NEGATIVE mg/dL
Hgb urine dipstick: NEGATIVE
Ketones, ur: NEGATIVE mg/dL
Leukocytes,Ua: NEGATIVE
Nitrite: NEGATIVE
Protein, ur: NEGATIVE mg/dL
Specific Gravity, Urine: 1.015 (ref 1.005–1.030)
pH: 8.5 — ABNORMAL HIGH (ref 5.0–8.0)

## 2023-06-24 MED ORDER — FLUCONAZOLE 150 MG PO TABS: 150.0000 mg | ORAL_TABLET | ORAL | 0 refills | Status: AC

## 2023-06-24 NOTE — ED Triage Notes (Signed)
Pt c/o sxs of UTI, pt reports dysuria, odor, frequency onset Monday.

## 2023-06-24 NOTE — Discharge Instructions (Addendum)
Your urinalysis does not show an infection but does show yeast   Begin use of diflucan, take one tablet today then if symptoms still occur take 2nd dose in 3 days   You may use over-the-counter Azo to help minimize your symptoms until antibiotic removes bacteria, this medication will turn your urine orange  Increase your fluid intake through use of water  As always practice good hygiene, wiping front to back and avoidance of scented vaginal products to prevent further irritation  If symptoms continue to persist after use of medication or recur please follow-up with urgent care or your primary doctor as needed

## 2023-06-24 NOTE — ED Provider Notes (Addendum)
[facility]    CSN: [id number] Arrival date & time: 06/24/23  1524      History   Chief Complaint Chief Complaint  Patient presents with   Dysuria   Urinary Frequency    HPI Danielle Romero is a 42 y.o. female.    Patient presents for evaluation of urinary frequency, dysuria and suprapubic cramping for 3 days.  Has foul odor only when urinating.  Has attempted use of Azo, cranberry juice and water.  Denies hematuria, flank pain, fever or vaginal symptoms.     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   Family history of breast cancer 02/24/2022   Anemia of pregnancy 04/07/2018   Heartburn during pregnancy in second trimester 01/26/2018   History of cesarean delivery 10/06/2017   Breast mass, left    Abnormal mammogram of left breast     Past Surgical History:  Procedure Laterality Date   BREAST BIOPSY Left 08/13/2016   Procedure: left BREAST BIOPSY WITH NEEDLE LOCALIZATION left Moeller's gland excision;  Surgeon: Lattie Haw, MD;  Location: [facility] ORS;  Service: General;  Laterality: Left;   BREAST EXCISIONAL BIOPSY Left 08/13/2016   CESAREAN SECTION  2007   HYSTEROSCOPY WITH D & C N/A 12/26/2020   Procedure: DILATATION AND CURETTAGE /HYSTEROSCOPY;  Surgeon: Vena Austria, MD;  Location: [facility] OR[facility];  Service: Gynecology;  Laterality: N/A;   INTRAUTERINE DEVICE (IUD) INSERTION N/A 12/26/2020   Procedure: INTRAUTERINE DEVICE (IUD) INSERTION;  Surgeon: Vena Austria, MD;  Location: [facility] OR[facility];   Service: Gynecology;  Laterality: N/A;   TUBAL LIGATION Bilateral 05/25/2018   Procedure: POST PARTUM TUBAL LIGATION;  Surgeon: Natale Milch, MD;  Location: [facility] OR[facility];  Service: Gynecology;  Laterality: Bilateral;    OB History     Gravida  6   Para  5   Term  5   Preterm      AB  1   Living  5      SAB      IAB      Ectopic      Multiple  0   Live Births  5            Home Medications    Prior to Admission medications   Medication Sig Start Date End Date Taking? Authorizing Provider  furosemide (LASIX) 20 MG tablet TAKE 1 TABLET BY MOUTH EVERY DAY 04/01/23   Berniece Salines, FNP  hydrochlorothiazide (HYDRODIURIL) 25 MG tablet Take 1 tablet (25 mg total) by mouth daily. 04/02/23   Berniece Salines, FNP  hydrOXYzine (VISTARIL) 25 MG capsule Take 1 capsule (25 mg total) by mouth at bedtime as needed (insomnia). 04/02/23   Berniece Salines, FNP  losartan (COZAAR) 25 MG tablet TAKE 1 TABLET (25 MG TOTAL) BY MOUTH DAILY. 03/29/23   Berniece Salines, FNP  tirzepatide (ZEPBOUND) 2.5 MG/0.5ML Pen Inject 2.5 mg into the skin once a week. 03/05/23   Berniece Salines, FNP  valACYclovir (VALTREX) 500 MG tablet Take 1 tablet (500 mg total) by mouth 2 (two) times daily. For 3 days prn sx 02/24/22   Copland, Ilona Sorrel, PA-C    Family History Family History  Problem Relation Age of Onset   Hypertension Mother    Breast cancer Mother 37   Kidney disease Mother    Cirrhosis Father    Alcohol abuse Father    Breast cancer Sister 53       dx at 56 and 61, in right + left breasts   Throat cancer Maternal Aunt    Colon cancer Paternal Uncle        dx over age 36   Colon cancer Cousin        dx under age 53    Social History Social History   Tobacco Use   Smoking status: Former    Current packs/day: 0.00    Average packs/day: 0.3 packs/day for 16.0 years (4.0 ttl pk-yrs)    Types: Cigarettes    Start date: 11/05/2003    Quit date: 11/05/2019    Years since quitting:  3.6   Smokeless tobacco: Never  Vaping Use   Vaping status: Every Day   Substances: Nicotine, Flavoring  Substance Use Topics   Alcohol use: Yes    Comment: Occasional   Drug use: Never     Allergies   Tape   Review of Systems Review of Systems  Genitourinary:  Positive for dysuria and frequency. Negative for decreased urine volume, difficulty urinating, dyspareunia, enuresis, flank pain, genital sores, hematuria, menstrual problem, pelvic pain, urgency, vaginal bleeding, vaginal discharge and vaginal pain.     Physical Exam Triage Vital Signs ED Triage Vitals [06/24/23 1655]  Encounter Vitals Group     BP (!) 152/111     Systolic BP Percentile      Diastolic BP Percentile      Pulse Rate 97     Resp      Temp 98.6 F (37 C)     Temp Source Oral     SpO2 97 %     Weight      Height      Head Circumference      Peak Flow      Pain Score 3     Pain Loc      Pain Education      Exclude from Growth Chart    No data found.  Updated Vital Signs BP (!) 152/111 (BP Location: Right Arm)   Pulse 97   Temp 98.6 F (37 C) (Oral)   SpO2 97%   Visual Acuity Right Eye Distance:   Left Eye Distance:   Bilateral Distance:    Right Eye Near:   Left Eye Near:    Bilateral Near:     Physical Exam Constitutional:      Appearance: Normal appearance.  Eyes:     Extraocular Movements: Extraocular movements intact.  Pulmonary:     Effort: Pulmonary effort is normal.  Abdominal:     Tenderness: There is no right CVA tenderness or left CVA tenderness.  Neurological:     Mental Status: She is alert and oriented to person, place, and time. Mental status is at baseline.      UC Treatments / Results  Labs (all labs ordered are listed, but only abnormal results are displayed) Labs Reviewed  URINALYSIS, W/ REFLEX TO CULTURE (INFECTION SUSPECTED) - Abnormal; Notable for the following components:      Result Value  APPearance CLOUDY (*)    pH 8.5 (*)    Bacteria, UA  FEW (*)    All other components within normal limits    EKG   Radiology No results found.  Procedures Procedures (including critical care time)  Medications Ordered in UC Medications - No data to display  Initial Impression / Assessment and Plan / UC Course  I have reviewed the triage vital signs and the nursing notes.  Pertinent labs & imaging results that were available during my care of the patient were reviewed by me and considered in my medical decision making (see chart for details).  Yeast cystitis  Urinalysis negative for bladder infection however does show yeast, diflucan prescribed and discussed importance duration, recommended additional supportive measures, may follow-up with urgent care as needed Final Clinical Impressions(s) / UC Diagnoses   Final diagnoses:  None   Discharge Instructions   None    ED Prescriptions   None    PDMP not reviewed this encounter.   Valinda Hoar, NP 06/28/23 0841    Valinda Hoar, NP 06/28/23 [phone]

## 2023-07-01 NOTE — Progress Notes (Signed)
BP 128/74 (BP Location: Left Arm, Patient Position: Sitting, Cuff Size: Large)   Pulse 94   Temp 98.3 F (36.8 C) (Oral)   Resp 14   Ht 5\' 7"  (1.702 m)   Wt 252 lb 11.2 oz (114.6 kg)   SpO2 98%   BMI 39.58 kg/m    Subjective:    Patient ID: Danielle Romero, female    DOB: 09-Feb-1981, 42 y.o.   MRN: [id number]  HPI: Danielle Romero is a 42 y.o. female  Chief Complaint  Patient presents with   Medical Management of Chronic Issues   Dysuria    X2 weeks   Urinary Tract Infection    Discussed the use of AI scribe software for clinical note transcription with the patient, who gave verbal consent to proceed.  History of Present Illness   The patient, with a history of hypertension, obesity, lower extremity edema, hyperlipidemia, and a goiter, presents with urinary symptoms that have been ongoing for two weeks. She describes her morning urine as cloudy and foul-smelling. She also reports a tingling sensation at the end of urination and lower abdominal cramps. The patient notes that these symptoms are more pronounced when she holds her urine for extended periods. She sought care at an urgent care center, where she detected yeast in her urine and prescribed Diflucan, which did not alleviate her symptoms. She found some relief with azo pills. She denies fever and back pain. She also reports poor sleep, stating that her current medication, hydroxyzine, is not effective.       07/02/2023    8:14 AM 04/02/2023    7:42 AM 03/05/2023    8:14 AM  Depression screen PHQ 2/9  Decreased Interest 0 1 0  Down, Depressed, Hopeless 0 1 0  PHQ - 2 Score 0 2 0  Altered sleeping 1 2   Tired, decreased energy 1 2   Change in appetite 0 0   Feeling bad or failure about yourself  0 2   Trouble concentrating 0 0   Moving slowly or fidgety/restless 0 0   Suicidal thoughts 0 0   PHQ-9 Score 2 8   Difficult doing work/chores Not difficult at all Not difficult at all     Relevant past medical,  surgical, family and social history reviewed and updated as indicated. Interim medical history since our last visit reviewed. Allergies and medications reviewed and updated.  Review of Systems  Constitutional: Negative for fever or weight change.  Respiratory: Negative for cough and shortness of breath.   Cardiovascular: Negative for chest pain or palpitations.  Gastrointestinal: Negative for abdominal pain, no bowel changes.  GU: urinary frequency Musculoskeletal: Negative for gait problem or joint swelling.  Skin: Negative for rash.  Neurological: Negative for dizziness or headache.  No other specific complaints in a complete review of systems (except as listed in HPI above).      Objective:    BP 128/74 (BP Location: Left Arm, Patient Position: Sitting, Cuff Size: Large)   Pulse 94   Temp 98.3 F (36.8 C) (Oral)   Resp 14   Ht 5\' 7"  (1.702 m)   Wt 252 lb 11.2 oz (114.6 kg)   SpO2 98%   BMI 39.58 kg/m   BP Readings from Last 3 Encounters:  07/02/23 128/74  06/24/23 (!) 152/111  04/02/23 128/78     Wt Readings from Last 3 Encounters:  07/02/23 252 lb 11.2 oz (114.6 kg)  04/02/23 252 lb (114.3 kg)  03/05/23  252 lb 1.6 oz (114.4 kg)    Physical Exam  Constitutional: Patient appears well-developed and well-nourished. Obese  No distress.  HEENT: head atraumatic, normocephalic, pupils equal and reactive to light, neck supple, throat within normal limits Cardiovascular: Normal rate, regular rhythm and normal heart sounds.  No murmur heard. No BLE edema. Pulmonary/Chest: Effort normal and breath sounds normal. No respiratory distress. Abdominal: Soft.  There is no tenderness. No CVA tenderness Psychiatric: Patient has a normal mood and affect. behavior is normal. Judgment and thought content normal.  Results for orders placed or performed in visit on 07/02/23  POCT Urinalysis Dip Manual   Collection Time: 07/02/23  8:25 AM  Result Value Ref Range   Spec Grav, UA <=1.005  (A) 1.010 - 1.025   pH, UA 6.5 5.0 - 8.0   Leukocytes, UA Small (1+) (A) Negative   Nitrite, UA Negative Negative   Poct Protein Negative Negative, trace mg/dL   Poct Glucose Normal Normal mg/dL   Poct Ketones Negative Negative   Poct Urobilinogen Normal Normal mg/dL   Poct Bilirubin Negative Negative   Poct Blood Negative Negative, trace   Last metabolic panel Lab Results  Component Value Date   GLUCOSE 85 03/05/2023   NA 140 03/05/2023   K 3.8 03/05/2023   CL 105 03/05/2023   CO2 25 03/05/2023   BUN 11 03/05/2023   CREATININE 0.61 03/05/2023   EGFR 114 03/05/2023   CALCIUM 9.1 03/05/2023   PROT 7.3 03/05/2023   ALBUMIN 3.0 (L) 04/19/2018   BILITOT 0.4 03/05/2023   ALKPHOS 176 (H) 04/19/2018   AST 16 03/05/2023   ALT 20 03/05/2023   ANIONGAP 7 04/19/2018   Last lipids Lab Results  Component Value Date   CHOL 167 03/05/2023   HDL 47 (L) 03/05/2023   LDLCALC 102 (H) 03/05/2023   TRIG 86 03/05/2023   CHOLHDL 3.6 03/05/2023   Last hemoglobin A1c Lab Results  Component Value Date   HGBA1C 5.6 03/05/2023   Last thyroid functions Lab Results  Component Value Date   TSH 0.91 03/05/2023        Assessment & Plan:   Problem List Items Addressed This Visit       Cardiovascular and Mediastinum   Primary hypertension - Primary   Relevant Orders   CBC with Differential/Platelet   COMPLETE METABOLIC PANEL WITH GFR     Endocrine   Goiter   Relevant Orders   TSH     Genitourinary   Herpes simplex vulvovaginitis   Relevant Medications   cephALEXin (KEFLEX) 500 MG capsule   valACYclovir (VALTREX) 500 MG tablet     Other   Class 2 severe obesity due to excess calories with serious comorbidity and body mass index (BMI) of 39.0 to 39.9 in adult [facility])   Relevant Medications   Semaglutide-Weight Management (WEGOVY) 0.25 MG/0.5ML SOAJ   Lower extremity edema   Hyperlipidemia   Relevant Orders   Lipid panel   Other insomnia   Relevant Medications   traZODone  (DESYREL) 50 MG tablet   Other Visit Diagnoses       Screening for diabetes mellitus       Relevant Orders   COMPLETE METABOLIC PANEL WITH GFR   Hemoglobin A1c     Dysuria       Relevant Orders   POCT Urinalysis Dip Manual (Completed)   Urine Culture   Cervicovaginal ancillary only     Urinary urgency       Relevant Medications  cephALEXin (KEFLEX) 500 MG capsule   Other Relevant Orders   POCT Urinalysis Dip Manual (Completed)   Urine Culture   Cervicovaginal ancillary only        Assessment and Plan    Urinary Symptoms Cloudy, malodorous urine with lower abdominal discomfort for 2 weeks. No fever or back pain. Previous treatment with Diflucan from urgent care without improvement. Urine dipstick shows leukocytes. -Start Keflex 500mg  BID for 5 days. -Send urine for culture and sensitivity. -Perform vaginal swab to rule out other causes of symptoms.  Insomnia Current treatment with Hydroxyzine ineffective. -Discontinue Hydroxyzine. -Start Trazodone for sleep.  Hypertension Well controlled with current regimen of Lasix 20mg  daily, Hydrochlorothiazide 25mg  daily, and Losartan 25mg  daily. -Continue current regimen.  Obesity Previous attempt to get approval for Zepbound unsuccessful. -Attempt to get approval for [facility] again. -Check A1C to assess for possible diabetes.  Hyperlipidemia -Continue current lifestyle modification -check lipid panel today  Goiter No current issues  -Check thyroid function tests.  Lower Extremity Edema Improved with Lasix. -Continue Lasix 20mg  daily.  General Health Maintenance -Check blood pressure at home. -Return for follow-up in 3 months if Reginal Lutes is approved, otherwise return in 6 months.        Follow up plan: Return in about 3 months (around 09/30/2023) for follow up.

## 2023-07-02 ENCOUNTER — Encounter: Payer: Self-pay | Admitting: Nurse Practitioner

## 2023-07-02 ENCOUNTER — Other Ambulatory Visit (HOSPITAL_COMMUNITY)
Admission: RE | Admit: 2023-07-02 | Discharge: 2023-07-02 | Disposition: A | Discharge status: Home / Self Care | Payer: BC Managed Care – PPO | Source: Ambulatory Visit | Attending: Nurse Practitioner | Admitting: Nurse Practitioner

## 2023-07-02 ENCOUNTER — Ambulatory Visit (INDEPENDENT_AMBULATORY_CARE_PROVIDER_SITE_OTHER): Payer: BC Managed Care – PPO | Admitting: Nurse Practitioner

## 2023-07-02 VITALS — BP 128/74 | HR 94 | Temp 98.3°F | Resp 14 | Ht 67.0 in | Wt 252.7 lb

## 2023-07-02 DIAGNOSIS — A6004 Herpesviral vulvovaginitis: Secondary | ICD-10-CM

## 2023-07-02 DIAGNOSIS — R300 Dysuria: Secondary | ICD-10-CM | POA: Diagnosis present

## 2023-07-02 DIAGNOSIS — E66812 Obesity, class 2: Secondary | ICD-10-CM

## 2023-07-02 DIAGNOSIS — I10 Essential (primary) hypertension: Secondary | ICD-10-CM

## 2023-07-02 DIAGNOSIS — R3915 Urgency of urination: Secondary | ICD-10-CM

## 2023-07-02 DIAGNOSIS — R600 Localized edema: Secondary | ICD-10-CM | POA: Diagnosis not present

## 2023-07-02 DIAGNOSIS — G4709 Other insomnia: Secondary | ICD-10-CM | POA: Diagnosis not present

## 2023-07-02 DIAGNOSIS — E049 Nontoxic goiter, unspecified: Secondary | ICD-10-CM | POA: Insufficient documentation

## 2023-07-02 DIAGNOSIS — E785 Hyperlipidemia, unspecified: Secondary | ICD-10-CM

## 2023-07-02 DIAGNOSIS — Z131 Encounter for screening for diabetes mellitus: Secondary | ICD-10-CM

## 2023-07-02 DIAGNOSIS — Z6839 Body mass index (BMI) 39.0-39.9, adult: Secondary | ICD-10-CM

## 2023-07-02 LAB — POCT URINALYSIS DIPSTICK (MANUAL)
Nitrite, UA: NEGATIVE
Poct Bilirubin: NEGATIVE
Poct Blood: NEGATIVE
Poct Glucose: NORMAL mg/dL
Poct Ketones: NEGATIVE
Poct Protein: NEGATIVE mg/dL
Poct Urobilinogen: NORMAL mg/dL
Spec Grav, UA: <=1.005 — AB (ref 1.010–1.025)
pH, UA: 6.5 (ref 5.0–8.0)

## 2023-07-02 MED ORDER — VALACYCLOVIR HCL 500 MG PO TABS: 500.0000 mg | ORAL_TABLET | Freq: Two times a day (BID) | ORAL | 1 refills | Status: DC

## 2023-07-02 MED ORDER — CEPHALEXIN 500 MG PO CAPS: 500.0000 mg | ORAL_CAPSULE | Freq: Two times a day (BID) | ORAL | 0 refills | Status: AC

## 2023-07-02 MED ORDER — WEGOVY 0.25 MG/0.5ML ~~LOC~~ SOAJ: 0.2500 mg | SUBCUTANEOUS | 0 refills | Status: DC

## 2023-07-02 MED ORDER — TRAZODONE HCL 50 MG PO TABS: 25.0000 mg | ORAL_TABLET | Freq: Every evening | ORAL | 1 refills | Status: DC | PRN

## 2023-07-05 LAB — CBC WITH DIFFERENTIAL/PLATELET
Absolute Lymphocytes: 2228 {cells}/uL (ref 850–3900)
Absolute Monocytes: 672 {cells}/uL (ref 200–950)
Basophils Absolute: 57 {cells}/uL (ref 0–200)
Basophils Relative: 0.7 %
Eosinophils Absolute: 57 {cells}/uL (ref 15–500)
Eosinophils Relative: 0.7 %
HCT: 37.8 % (ref 35.0–45.0)
Hemoglobin: 12.7 g/dL (ref 11.7–15.5)
MCH: 28.2 pg (ref 27.0–33.0)
MCHC: 33.6 g/dL (ref 32.0–36.0)
MCV: 83.8 fL (ref 80.0–100.0)
MPV: 11.3 fL (ref 7.5–12.5)
Monocytes Relative: 8.3 %
Neutro Abs: 5087 {cells}/uL (ref 1500–7800)
Neutrophils Relative %: 62.8 %
Platelets: 299 10*3/uL (ref 140–400)
RBC: 4.51 10*6/uL (ref 3.80–5.10)
RDW: 13.4 % (ref 11.0–15.0)
Total Lymphocyte: 27.5 %
WBC: 8.1 10*3/uL (ref 3.8–10.8)

## 2023-07-05 LAB — COMPLETE METABOLIC PANEL WITH GFR
AG Ratio: 1.3 (calc) (ref 1.0–2.5)
ALT: 13 U/L (ref 6–29)
AST: 11 U/L (ref 10–30)
Albumin: 4.2 g/dL (ref 3.6–5.1)
Alkaline phosphatase (APISO): 96 U/L (ref 31–125)
BUN: 13 mg/dL (ref 7–25)
CO2: 26 mmol/L (ref 20–32)
Calcium: 9.7 mg/dL (ref 8.6–10.2)
Chloride: 104 mmol/L (ref 98–110)
Creat: 0.67 mg/dL (ref 0.50–0.99)
Globulin: 3.2 g/dL (ref 1.9–3.7)
Glucose, Bld: 91 mg/dL (ref 65–99)
Potassium: 3.9 mmol/L (ref 3.5–5.3)
Sodium: 138 mmol/L (ref 135–146)
Total Bilirubin: 0.5 mg/dL (ref 0.2–1.2)
Total Protein: 7.4 g/dL (ref 6.1–8.1)
eGFR: 111 mL/min/{1.73_m2} (ref >=60)

## 2023-07-05 LAB — LIPID PANEL
Cholesterol: 180 mg/dL (ref <200)
HDL: 41 mg/dL — ABNORMAL LOW (ref >=50)
LDL Cholesterol (Calc): 120 mg/dL — ABNORMAL HIGH
Non-HDL Cholesterol (Calc): 139 mg/dL — ABNORMAL HIGH (ref <130)
Total CHOL/HDL Ratio: 4.4 (calc) (ref <5.0)
Triglycerides: 93 mg/dL (ref <150)

## 2023-07-05 LAB — URINE CULTURE
MICRO NUMBER:: 15877833
SPECIMEN QUALITY:: ADEQUATE

## 2023-07-05 LAB — HEMOGLOBIN A1C
Hgb A1c MFr Bld: 5.3 %{Hb} (ref <5.7)
Mean Plasma Glucose: 105 mg/dL
eAG (mmol/L): 5.8 mmol/L

## 2023-07-05 LAB — TSH: TSH: 1.07 m[IU]/L

## 2023-07-06 LAB — CERVICOVAGINAL ANCILLARY ONLY
Bacterial Vaginitis (gardnerella): NEGATIVE
Candida Glabrata: NEGATIVE
Candida Vaginitis: NEGATIVE
Chlamydia: NEGATIVE
Comment: NEGATIVE
Comment: NEGATIVE
Comment: NEGATIVE
Comment: NEGATIVE
Comment: NEGATIVE
Comment: NORMAL
Neisseria Gonorrhea: NEGATIVE
Trichomonas: NEGATIVE

## 2023-07-08 ENCOUNTER — Other Ambulatory Visit: Payer: Self-pay | Admitting: Nurse Practitioner

## 2023-07-08 ENCOUNTER — Encounter: Payer: Self-pay | Admitting: Nurse Practitioner

## 2023-07-08 MED ORDER — WEGOVY 0.25 MG/0.5ML ~~LOC~~ SOAJ: 0.5000 mg | SUBCUTANEOUS | 0 refills | Status: DC

## 2023-07-10 ENCOUNTER — Other Ambulatory Visit: Payer: Self-pay | Admitting: Nurse Practitioner

## 2023-07-10 DIAGNOSIS — A6004 Herpesviral vulvovaginitis: Secondary | ICD-10-CM

## 2023-07-15 NOTE — Telephone Encounter (Signed)
 Requested medications are due for refill today.  no  Requested medications are on the active medications list.  yes  Last refill. 07/02/2023 #30 1 rf  Future visit scheduled.   yes  Notes to clinic.  Pt is requesting a 90 day supply.    Requested Prescriptions  Pending Prescriptions Disp Refills   valACYclovir  (VALTREX ) 500 MG tablet [Pharmacy Med Name: VALACYCLOVIR  HCL 500 MG TABLET] 180 tablet 1    Sig: TAKE 1 TABLET (500 MG TOTAL) BY MOUTH 2 (TWO) TIMES DAILY. FOR 3 DAYS AS NEEDED SYMPTOMS     Antimicrobials:  Antiviral Agents - Anti-Herpetic Passed - 07/15/2023 12:01 PM      Passed - Valid encounter within last 12 months    Recent Outpatient Visits           1 week ago Primary hypertension   [facility] Gareth Mliss FALCON, FNP   3 months ago Primary hypertension   [facility] Gareth Mliss FALCON, FNP   4 months ago Primary hypertension   [facility] Gareth Mliss FALCON, FNP       Future Appointments             In 2 months Gareth, Mliss FALCON, FNP [facility], [facility]

## 2023-07-26 ENCOUNTER — Telehealth: Payer: Self-pay | Admitting: Nurse Practitioner

## 2023-07-26 NOTE — Telephone Encounter (Signed)
PA sent in today

## 2023-07-26 NOTE — Telephone Encounter (Signed)
 Shervon from [employer] [facility] called requesting clinical notes for medication Semaglutide-Weight Management (WEGOVY). She will send in a request form as well.

## 2023-09-25 ENCOUNTER — Other Ambulatory Visit: Payer: Self-pay | Admitting: Nurse Practitioner

## 2023-09-25 DIAGNOSIS — R600 Localized edema: Secondary | ICD-10-CM

## 2023-09-25 DIAGNOSIS — I10 Essential (primary) hypertension: Secondary | ICD-10-CM

## 2023-09-27 NOTE — Telephone Encounter (Signed)
 Requested Prescriptions  Pending Prescriptions Disp Refills   losartan (COZAAR) 25 MG tablet [Pharmacy Med Name: LOSARTAN POTASSIUM 25 MG TAB] 90 tablet 0    Sig: TAKE 1 TABLET (25 MG TOTAL) BY MOUTH DAILY.     Cardiovascular:  Angiotensin Receptor Blockers Passed - 09/27/2023  1:59 PM      Passed - Cr in normal range and within 180 days    Creat  Date Value Ref Range Status  07/02/2023 0.67 0.50 - 0.99 mg/dL Final         Passed - K in normal range and within 180 days    Potassium  Date Value Ref Range Status  07/02/2023 3.9 3.5 - 5.3 mmol/L Final         Passed - Patient is not pregnant      Passed - Last BP in normal range    BP Readings from Last 1 Encounters:  07/02/23 128/74         Passed - Valid encounter within last 6 months    Recent Outpatient Visits           2 months ago Primary hypertension   [facility] Berniece Salines, FNP   5 months ago Primary hypertension   [facility] Berniece Salines, FNP   6 months ago Primary hypertension   [facility] Berniece Salines, FNP       Future Appointments             In 3 days Zane Herald, Rudolpho Sevin, FNP [facility]             furosemide (LASIX) 20 MG tablet [Pharmacy Med Name: FUROSEMIDE 20 MG TABLET] 90 tablet 0    Sig: TAKE 1 TABLET BY MOUTH EVERY DAY     Cardiovascular:  Diuretics - Loop Failed - 09/27/2023  1:59 PM      Failed - Mg Level in normal range and within 180 days    No results found for: "MG"       Passed - K in normal range and within 180 days    Potassium  Date Value Ref Range Status  07/02/2023 3.9 3.5 - 5.3 mmol/L Final         Passed - Ca in normal range and within 180 days    Calcium  Date Value Ref Range Status  07/02/2023 9.7 8.6 - 10.2 mg/dL Final         Passed - Na in normal range and within 180 days    Sodium  Date Value Ref Range Status  07/02/2023 138 135 - 146  mmol/L Final         Passed - Cr in normal range and within 180 days    Creat  Date Value Ref Range Status  07/02/2023 0.67 0.50 - 0.99 mg/dL Final         Passed - Cl in normal range and within 180 days    Chloride  Date Value Ref Range Status  07/02/2023 104 98 - 110 mmol/L Final         Passed - Last BP in normal range    BP Readings from Last 1 Encounters:  07/02/23 128/74         Passed - Valid encounter within last 6 months    Recent Outpatient Visits           2 months ago Primary hypertension   [facility] Berniece Salines, FNP   5 months ago Primary hypertension   [facility] Berniece Salines, FNP   6 months ago Primary hypertension   [facility] Berniece Salines, FNP       Future Appointments             In 3 days Zane Herald, Rudolpho Sevin, FNP [facility]             hydrochlorothiazide (HYDRODIURIL) 25 MG tablet [Pharmacy Med Name: HYDROCHLOROTHIAZIDE 25 MG TAB] 90 tablet 0    Sig: TAKE 1 TABLET (25 MG TOTAL) BY MOUTH DAILY.     Cardiovascular: Diuretics - Thiazide Passed - 09/27/2023  1:59 PM      Passed - Cr in normal range and within 180 days    Creat  Date Value Ref Range Status  07/02/2023 0.67 0.50 - 0.99 mg/dL Final         Passed - K in normal range and within 180 days    Potassium  Date Value Ref Range Status  07/02/2023 3.9 3.5 - 5.3 mmol/L Final         Passed - Na in normal range and within 180 days    Sodium  Date Value Ref Range Status  07/02/2023 138 135 - 146 mmol/L Final         Passed - Last BP in normal range    BP Readings from Last 1 Encounters:  07/02/23 128/74         Passed - Valid encounter within last 6 months    Recent Outpatient Visits           2 months ago Primary hypertension   [facility] Berniece Salines, FNP   5 months ago Primary hypertension   [facility] Berniece Salines, FNP   6 months ago Primary hypertension   [facility] Berniece Salines, FNP       Future Appointments             In 3 days Zane Herald, Rudolpho Sevin, FNP [facility], [facility]

## 2023-09-30 ENCOUNTER — Encounter: Payer: Self-pay | Admitting: Nurse Practitioner

## 2023-09-30 ENCOUNTER — Ambulatory Visit (INDEPENDENT_AMBULATORY_CARE_PROVIDER_SITE_OTHER): Payer: BC Managed Care – PPO | Admitting: Nurse Practitioner

## 2023-09-30 VITALS — BP 122/84 | HR 98 | Resp 18 | Ht 67.0 in | Wt 252.0 lb

## 2023-09-30 DIAGNOSIS — Z6839 Body mass index (BMI) 39.0-39.9, adult: Secondary | ICD-10-CM

## 2023-09-30 DIAGNOSIS — E66812 Obesity, class 2: Secondary | ICD-10-CM | POA: Diagnosis not present

## 2023-09-30 DIAGNOSIS — R600 Localized edema: Secondary | ICD-10-CM

## 2023-09-30 DIAGNOSIS — I10 Essential (primary) hypertension: Secondary | ICD-10-CM

## 2023-09-30 NOTE — Progress Notes (Signed)
 BP 122/84   Pulse 98   Resp 18   Ht 5\' 7"  (1.702 m)   Wt 252 lb (114.3 kg)   SpO2 96%   BMI 39.47 kg/m    Subjective:    Patient ID: Danielle Romero, female    DOB: Nov 21, 1980, 43 y.o.   MRN: [id number]  HPI: Danielle Romero is a 43 y.o. female  Chief Complaint  Patient presents with   Medical Management of Chronic Issues    Discussed the use of AI scribe software for clinical note transcription with the patient, who gave verbal consent to proceed.  History of Present Illness Danielle Romero is a 43 year old female with hypertension, hyperlipidemia, and obesity who presents with lower extremity edema.  She has been experiencing worsening lower extremity edema despite taking Lasix 20 mg daily. She describes the medication as 'not working like it used to' and is concerned about water retention. She has not undergone any ultrasounds of her legs or consulted a vascular specialist previously.  Her medical history includes hypertension, hyperlipidemia, and obesity. Current medications are hydrochlorothiazide 25 mg daily and losartan 25 mg daily. She recently started compounded semaglutide about a month ago, which she is tolerating well.  Her last lipid panel in December 2024 showed a cholesterol level of 120 mg/dL, with normal V7Q and thyroid levels. CBC and CMP were also normal. Her weight was recorded at 252 pounds with a BMI of 39.58 as of July 02, 2023. She notes significant weight fluctuations from morning to afternoon.            07/02/2023    8:14 AM 04/02/2023    7:42 AM 03/05/2023    8:14 AM  Depression screen PHQ 2/9  Decreased Interest 0 1 0  Down, Depressed, Hopeless 0 1 0  PHQ - 2 Score 0 2 0  Altered sleeping 1 2   Tired, decreased energy 1 2   Change in appetite 0 0   Feeling bad or failure about yourself  0 2   Trouble concentrating 0 0   Moving slowly or fidgety/restless 0 0   Suicidal thoughts 0 0   PHQ-9 Score 2 8   Difficult doing work/chores  Not difficult at all Not difficult at all     Relevant past medical, surgical, family and social history reviewed and updated as indicated. Interim medical history since our last visit reviewed. Allergies and medications reviewed and updated.  Review of Systems  Constitutional: Negative for fever or weight change.  Respiratory: Negative for cough and shortness of breath.   Cardiovascular: Negative for chest pain or palpitations. Positive for lower extremity edema Gastrointestinal: Negative for abdominal pain, no bowel changes.  Musculoskeletal: Negative for gait problem or joint swelling.  Skin: Negative for rash.  Neurological: Negative for dizziness or headache.  No other specific complaints in a complete review of systems (except as listed in HPI above).      Objective:    BP 122/84   Pulse 98   Resp 18   Ht 5\' 7"  (1.702 m)   Wt 252 lb (114.3 kg)   SpO2 96%   BMI 39.47 kg/m    Wt Readings from Last 3 Encounters:  09/30/23 252 lb (114.3 kg)  07/02/23 252 lb 11.2 oz (114.6 kg)  04/02/23 252 lb (114.3 kg)    Physical Exam  Physical Exam VITALS: BP- 122/84 GENERAL: Alert, cooperative, well developed, no acute distress HEENT: Normocephalic, normal oropharynx, moist mucous membranes CHEST:  Clear to auscultation bilaterally, no wheezes, rhonchi, or crackles CARDIOVASCULAR: Normal heart rate and rhythm, S1 and S2 normal without murmurs ABDOMEN: Soft, non-tender, non-distended, without organomegaly, normal bowel sounds EXTREMITIES: non-pitting edema in lower extremities NEUROLOGICAL: Cranial nerves grossly intact, moves all extremities without gross motor or sensory deficit   Results for orders placed or performed in visit on 07/02/23  POCT Urinalysis Dip Manual   Collection Time: 07/02/23  8:25 AM  Result Value Ref Range   Spec Grav, UA <=1.005 (A) 1.010 - 1.025   pH, UA 6.5 5.0 - 8.0   Leukocytes, UA Small (1+) (A) Negative   Nitrite, UA Negative Negative   Poct  Protein Negative Negative, trace mg/dL   Poct Glucose Normal Normal mg/dL   Poct Ketones Negative Negative   Poct Urobilinogen Normal Normal mg/dL   Poct Bilirubin Negative Negative   Poct Blood Negative Negative, trace  Cervicovaginal ancillary only   Collection Time: 07/02/23  8:40 AM  Result Value Ref Range   Neisseria Gonorrhea Negative    Chlamydia Negative    Trichomonas Negative    Bacterial Vaginitis (gardnerella) Negative    Candida Vaginitis Negative    Candida Glabrata Negative    Comment      Normal Reference Range Bacterial Vaginosis - Negative   Comment Normal Reference Ranger Chlamydia - Negative    Comment      Normal Reference Range Neisseria Gonorrhea - Negative   Comment Normal Reference Range Candida Species - Negative    Comment Normal Reference Range Candida Galbrata - Negative    Comment Normal Reference Range Trichomonas - Negative   Urine Culture   Collection Time: 07/02/23  8:48 AM  Result Value Ref Range   MICRO NUMBER: [id number]    SPECIMEN QUALITY: Adequate    Sample Source URINE, CLEAN CATCH    STATUS: FINAL    Result:      This organism may show imipenem resistance by mechanisms other than a carbapenemase.   ISOLATE 1: Proteus mirabilis (A)       Susceptibility   Proteus mirabilis - URINE CULTURE, REFLEX    AMOX/CLAVULANIC <=2 Sensitive     AMPICILLIN <=2 Sensitive     AMPICILLIN/SULBACTAM <=2 Sensitive     CEFAZOLIN* <=4 Not Reportable      * For infections other than uncomplicated UTI caused by E. coli, K. pneumoniae or P. mirabilis: Cefazolin is resistant if MIC > or = 8 mcg/mL. (Distinguishing susceptible versus intermediate for isolates with MIC < or = 4 mcg/mL requires additional testing.) For uncomplicated UTI caused by E. coli, K. pneumoniae or P. mirabilis: Cefazolin is susceptible if MIC <32 mcg/mL and predicts susceptible to the oral agents cefaclor, cefdinir, cefpodoxime, cefprozil, cefuroxime, cephalexin and loracarbef.      CEFTAZIDIME <=1 Sensitive     CEFEPIME <=1 Sensitive     CEFTRIAXONE <=1 Sensitive     CIPROFLOXACIN <=0.25 Sensitive     LEVOFLOXACIN <=0.12 Sensitive     GENTAMICIN <=1 Sensitive     IMIPENEM 2 Intermediate     NITROFURANTOIN 64 Resistant     PIP/TAZO <=4 Sensitive     TOBRAMYCIN <=1 Sensitive     TRIMETH/SULFA* <=20 Sensitive      * For infections other than uncomplicated UTI caused by E. coli, K. pneumoniae or P. mirabilis: Cefazolin is resistant if MIC > or = 8 mcg/mL. (Distinguishing susceptible versus intermediate for isolates with MIC < or = 4 mcg/mL requires additional testing.) For uncomplicated UTI caused by  E. coli, K. pneumoniae or P. mirabilis: Cefazolin is susceptible if MIC <32 mcg/mL and predicts susceptible to the oral agents cefaclor, cefdinir, cefpodoxime, cefprozil, cefuroxime, cephalexin and loracarbef. Legend: S = Susceptible  I = Intermediate R = Resistant  NS = Not susceptible SDD = Susceptible Dose Dependent * = Not Tested  NR = Not Reported **NN = See Therapy Comments   CBC with Differential/Platelet   Collection Time: 07/02/23  8:48 AM  Result Value Ref Range   WBC 8.1 3.8 - 10.8 Thousand/uL   RBC 4.51 3.80 - 5.10 Million/uL   Hemoglobin 12.7 11.7 - 15.5 g/dL   HCT 25.3 66.4 - 40.3 %   MCV 83.8 80.0 - 100.0 fL   MCH 28.2 27.0 - 33.0 pg   MCHC 33.6 32.0 - 36.0 g/dL   RDW 47.4 25.9 - 56.3 %   Platelets 299 140 - 400 Thousand/uL   MPV 11.3 7.5 - 12.5 fL   Neutro Abs 5,087 1,500 - 7,800 cells/uL   Absolute Lymphocytes 2,228 850 - 3,900 cells/uL   Absolute Monocytes 672 200 - 950 cells/uL   Eosinophils Absolute 57 15 - 500 cells/uL   Basophils Absolute 57 0 - 200 cells/uL   Neutrophils Relative % 62.8 %   Total Lymphocyte 27.5 %   Monocytes Relative 8.3 %   Eosinophils Relative 0.7 %   Basophils Relative 0.7 %  COMPLETE METABOLIC PANEL WITH GFR   Collection Time: 07/02/23  8:48 AM  Result Value Ref Range   Glucose, Bld 91 65 - 99 mg/dL    BUN 13 7 - 25 mg/dL   Creat 8.75 6.43 - 3.29 mg/dL   eGFR 518 > OR = 60 AC/ZYS/0.63K1   BUN/Creatinine Ratio SEE NOTE: 6 - 22 (calc)   Sodium 138 135 - 146 mmol/L   Potassium 3.9 3.5 - 5.3 mmol/L   Chloride 104 98 - 110 mmol/L   CO2 26 20 - 32 mmol/L   Calcium 9.7 8.6 - 10.2 mg/dL   Total Protein 7.4 6.1 - 8.1 g/dL   Albumin 4.2 3.6 - 5.1 g/dL   Globulin 3.2 1.9 - 3.7 g/dL (calc)   AG Ratio 1.3 1.0 - 2.5 (calc)   Total Bilirubin 0.5 0.2 - 1.2 mg/dL   Alkaline phosphatase (APISO) 96 31 - 125 U/L   AST 11 10 - 30 U/L   ALT 13 6 - 29 U/L  Lipid panel   Collection Time: 07/02/23  8:48 AM  Result Value Ref Range   Cholesterol 180 <200 mg/dL   HDL 41 (L) > OR = 50 mg/dL   Triglycerides 93 <601 mg/dL   LDL Cholesterol (Calc) 120 (H) mg/dL (calc)   Total CHOL/HDL Ratio 4.4 <5.0 (calc)   Non-HDL Cholesterol (Calc) 139 (H) <130 mg/dL (calc)  TSH   Collection Time: 07/02/23  8:48 AM  Result Value Ref Range   TSH 1.07 mIU/L  Hemoglobin A1c   Collection Time: 07/02/23  8:48 AM  Result Value Ref Range   Hgb A1c MFr Bld 5.3 <5.7 % of total Hgb   Mean Plasma Glucose 105 mg/dL   eAG (mmol/L) 5.8 mmol/L       Assessment & Plan:   Problem List Items Addressed This Visit       Cardiovascular and Mediastinum   Primary hypertension     Other   Class 2 severe obesity due to excess calories with serious comorbidity and body mass index (BMI) of 39.0 to 39.9 in adult Danielle Romero)  Lower extremity edema - Primary   Relevant Orders   Ambulatory referral to Vascular Surgery    Assessment and Plan Assessment & Plan Lower extremity edema Lasix is less effective, leading to recurrent water retention. Contributing factors include hypertension, hyperlipidemia, and obesity. The edema is non-pitting and persistent, suggesting a possible vascular issue requiring further investigation. - Refer to a vein specialist for evaluation - Order ultrasound of the legs to assess for vascular  problems  Hypertension Blood pressure is well-controlled at 122/84 mmHg on losartan 25 mg daily and hydrochlorothiazide 25 mg daily, with no significant side effects. - Continue losartan 25 mg daily - Continue hydrochlorothiazide 25 mg daily  Obesity Current weight is 252 pounds with a BMI of 39.58. She has been on semaglutide for about a month and is tolerating it well. Advised to monitor weight consistently at the same time of day due to fluctuations. - Continue semaglutide as prescribed - Advise to monitor weight consistently at the same time of day  Hyperlipidemia Last lipid panel in December 2024 showed a total cholesterol of 120 mg/dL, within normal limits. Not on any lipid-lowering medication.  Follow-up Emphasizes the importance of follow-up with the vein specialist and requests that she inform of the results. - Follow up with vein specialist and report findings                           Contains text generated by [employer]bridge.                                 Contains text generated by Abridge.      Follow up plan: No follow-ups on file.

## 2023-11-19 ENCOUNTER — Ambulatory Visit (INDEPENDENT_AMBULATORY_CARE_PROVIDER_SITE_OTHER): Admitting: Vascular Surgery

## 2023-11-19 ENCOUNTER — Encounter (INDEPENDENT_AMBULATORY_CARE_PROVIDER_SITE_OTHER): Payer: Self-pay | Admitting: Vascular Surgery

## 2023-11-19 VITALS — BP 127/85 | HR 92 | Resp 18 | Ht 67.0 in | Wt 244.6 lb

## 2023-11-19 DIAGNOSIS — E669 Obesity, unspecified: Secondary | ICD-10-CM | POA: Diagnosis not present

## 2023-11-19 DIAGNOSIS — R600 Localized edema: Secondary | ICD-10-CM | POA: Diagnosis not present

## 2023-11-19 DIAGNOSIS — I10 Essential (primary) hypertension: Secondary | ICD-10-CM

## 2023-11-19 NOTE — Progress Notes (Signed)
 Subjective:    Patient ID: Danielle Romero, female    DOB: [birth year], 43 y.o.   MRN: [id number] Chief Complaint  Patient presents with   New Patient (Initial Visit)    np. consult. lower extremity edema  ref: Naliyah, Faris is a 43 year old female that presents to clinic today for concerns of bilateral lower extremity swelling.  Patient endorses her primary care physician has her on water pills that seem to help somewhat but are not truly controlling the problem.  Patient states she has had some lower extremity swelling for years.  She endorses today that she knows she is overweight and is currently on a medication regime to help with weight loss.  She endorses she is lost 10 pounds over the past month.  Patient denies any pain to bilateral lower extremities at rest or while ambulating.  She does say that her lower extremities are uncomfortable walking long distances or standing all day as the swelling progresses throughout the day.  She has not tried any conventional methods for helping to control vascular insufficiency or lymphedema.    Review of Systems  Constitutional: Negative.   HENT: Negative.    Respiratory: Negative.    Cardiovascular:  Positive for leg swelling.  Gastrointestinal: Negative.   Genitourinary: Negative.   Musculoskeletal:  Positive for joint swelling.  Skin: Negative.   Neurological: Negative.   Psychiatric/Behavioral: Negative.    All other systems reviewed and are negative.      Objective:    Physical Exam Vitals reviewed.  Constitutional:      Appearance: Normal appearance. She is obese.  HENT:     Head: Normocephalic.  Eyes:     Pupils: Pupils are equal, round, and reactive to light.  Cardiovascular:     Rate and Rhythm: Normal rate and regular rhythm.     Pulses: Normal pulses.     Heart sounds: Normal heart sounds.  Pulmonary:     Effort: Pulmonary effort is normal.     Breath sounds: Normal breath sounds.  Abdominal:      General: Abdomen is flat. Bowel sounds are normal.     Palpations: Abdomen is soft.  Musculoskeletal:     Right lower leg: Edema present.     Left lower leg: Edema present.  Skin:    General: Skin is warm and dry.     Capillary Refill: Capillary refill takes 2 to 3 seconds.  Neurological:     General: No focal deficit present.     Mental Status: She is alert and oriented to person, place, and time. Mental status is at baseline.  Psychiatric:        Mood and Affect: Mood normal.        Behavior: Behavior normal.        Thought Content: Thought content normal.        Judgment: Judgment normal.     BP 127/85   Pulse 92   Resp 18   Ht 5\' 7"  (1.702 m)   Wt 244 lb 9.6 oz (110.9 kg)   BMI 38.31 kg/m   Past Medical History:  Diagnosis Date   Breast mass    Bronchitis    GERD (gastroesophageal reflux disease)    NO MEDS   Hypertension    Obesity (BMI 30-39.9)     Social History   Socioeconomic History   Marital status: Married    Spouse name: Bambi Lever   Number of children: 4   Years of  education: Not on file   Highest education level: Associate degree: occupational, Scientist, product/process development, or vocational program  Occupational History   Occupation: Engineer, petroleum in Set designer  Tobacco Use   Smoking status: Former    Current packs/day: 0.00    Average packs/day: 0.3 packs/day for 16.0 years (4.0 ttl pk-yrs)    Types: Cigarettes    Start date: 11/05/2003    Quit date: 11/05/2019    Years since quitting: 4.0   Smokeless tobacco: Never  Vaping Use   Vaping status: Every Day   Substances: Nicotine, Flavoring  Substance and Sexual Activity   Alcohol use: Yes    Comment: Occasional   Drug use: Never   Sexual activity: Yes    Birth control/protection: I.U.D.    Comment: Mirena   Other Topics Concern   Not on file  Social History Narrative   Patient lives with her husband and 4 kids.   Feels safe in her home.   Social Drivers of Corporate investment banker Strain: Low  Risk  (07/01/2023)   Overall Financial Resource Strain (CARDIA)    Difficulty of Paying Living Expenses: Not hard at all  Food Insecurity: No Food Insecurity (07/01/2023)   Hunger Vital Sign    Worried About Running Out of Food in the Last Year: Never true    Ran Out of Food in the Last Year: Never true  Transportation Needs: No Transportation Needs (07/01/2023)   PRAPARE - Administrator, Civil Service (Medical): No    Lack of Transportation (Non-Medical): No  Physical Activity: Unknown (07/01/2023)   Exercise Vital Sign    Days of Exercise per Week: 0 days    Minutes of Exercise per Session: Not on file  Stress: No Stress Concern Present (07/01/2023)   [employer] of Occupational Health - Occupational Stress Questionnaire    Feeling of Stress : Only a little  Social Connections: Moderately Isolated (07/01/2023)   Social Connection and Isolation Panel [NHANES]    Frequency of Communication with Friends and Family: More than three times a week    Frequency of Social Gatherings with Friends and Family: Once a week    Attends Religious Services: Never    Database administrator or Organizations: No    Attends Engineer, structural: Not on file    Marital Status: Married  Catering manager Violence: Not on file    Past Surgical History:  Procedure Laterality Date   BREAST BIOPSY Left 08/13/2016   Procedure: left BREAST BIOPSY WITH NEEDLE LOCALIZATION left Moeller's gland excision;  Surgeon: Claudia Cuff, MD;  Location: [facility];  Service: General;  Laterality: Left;   BREAST EXCISIONAL BIOPSY Left 08/13/2016   CESAREAN SECTION  2007   HYSTEROSCOPY WITH D & C N/A 12/26/2020   Procedure: DILATATION AND CURETTAGE /HYSTEROSCOPY;  Surgeon: Darl Edu, MD;  Location: [facility];  Service: Gynecology;  Laterality: N/A;   INTRAUTERINE DEVICE (IUD) INSERTION N/A 12/26/2020   Procedure: INTRAUTERINE DEVICE (IUD) INSERTION;  Surgeon: Darl Edu, MD;   Location: [facility];  Service: Gynecology;  Laterality: N/A;   TUBAL LIGATION Bilateral 05/25/2018   Procedure: POST PARTUM TUBAL LIGATION;  Surgeon: Heron Lord, MD;  Location: [facility];  Service: Gynecology;  Laterality: Bilateral;    Family History  Problem Relation Age of Onset   Hypertension Mother    Breast cancer Mother 41   Kidney disease Mother    Cirrhosis Father    Alcohol abuse Father    Breast  cancer Sister 13       dx at 68 and 52, in right + left breasts   Throat cancer Maternal Aunt    Colon cancer Paternal Uncle        dx over age 2   Colon cancer Cousin        dx under age 79    Allergies  Allergen Reactions   Tape Rash    Adhesive (plastic) tape tears up skin       Latest Ref Rng & Units 07/02/2023    8:48 AM 03/05/2023    9:03 AM 05/25/2018    5:14 AM  CBC  WBC 3.8 - 10.8 Thousand/uL 8.1  8.0  15.4   Hemoglobin 11.7 - 15.5 g/dL 40.9  81.1  91.4   Hematocrit 35.0 - 45.0 % 37.8  39.3  32.8   Platelets 140 - 400 Thousand/uL 299  314  207        CMP     Component Value Date/Time   NA 138 07/02/2023 0848   K 3.9 07/02/2023 0848   CL 104 07/02/2023 0848   CO2 26 07/02/2023 0848   GLUCOSE 91 07/02/2023 0848   BUN 13 07/02/2023 0848   CREATININE 0.67 07/02/2023 0848   CALCIUM 9.7 07/02/2023 0848   PROT 7.4 07/02/2023 0848   ALBUMIN 3.0 (L) 04/19/2018 1041   AST 11 07/02/2023 0848   ALT 13 07/02/2023 0848   ALKPHOS 176 (H) 04/19/2018 1041   BILITOT 0.5 07/02/2023 0848   EGFR 111 07/02/2023 0848   GFRNONAA >60 04/19/2018 1041     No results found.     Assessment & Plan:   1. Lower extremity edema (Primary) Recommend:  I have had a long discussion with the patient regarding swelling and why it  causes symptoms.  Patient will begin wearing graduated compression on a daily basis a prescription was given. The patient will  wear the stockings first thing in the morning and removing them in the evening. The patient is instructed  specifically not to sleep in the stockings.   In addition, behavioral modification will be initiated.  This will include frequent elevation, use of over the counter pain medications and exercise such as walking.  Consideration for a lymph pump will also be made based upon the effectiveness of conservative therapy.  This would help to improve the edema control and prevent sequela such as ulcers and infections   Patient should undergo duplex ultrasound of the venous system to ensure that DVT or reflux is not present.  The patient will follow-up with me after the ultrasound.   2. Primary hypertension Continue antihypertensive medications as already ordered, these medications have been reviewed and there are no changes at this time.  3. Obesity, unspecified class, unspecified obesity type, unspecified whether serious comorbidity present Continue current medical regime for weight loss as prescribed by your PCP.  Follow-up with PCP for regular visits for checkups related to the use of this medication.   Current Outpatient Medications on File Prior to Visit  Medication Sig Dispense Refill   furosemide  (LASIX ) 20 MG tablet TAKE 1 TABLET BY MOUTH EVERY DAY 90 tablet 0   hydrochlorothiazide  (HYDRODIURIL ) 25 MG tablet TAKE 1 TABLET (25 MG TOTAL) BY MOUTH DAILY. 90 tablet 0   losartan  (COZAAR ) 25 MG tablet TAKE 1 TABLET (25 MG TOTAL) BY MOUTH DAILY. 90 tablet 0   Semaglutide -Weight Management (WEGOVY ) 0.25 MG/0.5ML SOAJ Inject 0.5 mg into the skin once a week. 4 mL  0   valACYclovir  (VALTREX ) 500 MG tablet TAKE 1 TABLET (500 MG TOTAL) BY MOUTH 2 (TWO) TIMES DAILY. FOR 3 DAYS AS NEEDED SYMPTOMS 180 tablet 1   No current facility-administered medications on file prior to visit.    There are no Patient Instructions on file for this visit. No follow-ups on file.   Annamaria Barrette, NP

## 2023-11-30 ENCOUNTER — Encounter (INDEPENDENT_AMBULATORY_CARE_PROVIDER_SITE_OTHER): Payer: Self-pay

## 2023-12-08 ENCOUNTER — Other Ambulatory Visit (INDEPENDENT_AMBULATORY_CARE_PROVIDER_SITE_OTHER): Payer: Self-pay | Admitting: Vascular Surgery

## 2023-12-08 DIAGNOSIS — R600 Localized edema: Secondary | ICD-10-CM

## 2023-12-15 ENCOUNTER — Encounter (INDEPENDENT_AMBULATORY_CARE_PROVIDER_SITE_OTHER)

## 2023-12-15 ENCOUNTER — Ambulatory Visit (INDEPENDENT_AMBULATORY_CARE_PROVIDER_SITE_OTHER): Admitting: Vascular Surgery

## 2023-12-28 ENCOUNTER — Other Ambulatory Visit: Payer: Self-pay | Admitting: Nurse Practitioner

## 2023-12-28 DIAGNOSIS — I10 Essential (primary) hypertension: Secondary | ICD-10-CM

## 2023-12-28 DIAGNOSIS — G4709 Other insomnia: Secondary | ICD-10-CM

## 2023-12-28 DIAGNOSIS — R600 Localized edema: Secondary | ICD-10-CM

## 2023-12-30 NOTE — Telephone Encounter (Signed)
 Requested medications are due for refill today.  yes  Requested medications are on the active medications list.  yes  Last refill. 09/27/2023   Future visit scheduled.   no  Notes to clinic.  Pt is prescribed both Lasix  and hydrochlorothiazide .    Requested Prescriptions  Pending Prescriptions Disp Refills   furosemide  (LASIX ) 20 MG tablet [Pharmacy Med Name: FUROSEMIDE  20 MG TABLET] 90 tablet 0    Sig: TAKE 1 TABLET BY MOUTH EVERY DAY     Cardiovascular:  Diuretics - Loop Failed - 12/30/2023 10:40 AM      Failed - K in normal range and within 180 days    Potassium  Date Value Ref Range Status  07/02/2023 3.9 3.5 - 5.3 mmol/L Final         Failed - Ca in normal range and within 180 days    Calcium  Date Value Ref Range Status  07/02/2023 9.7 8.6 - 10.2 mg/dL Final         Failed - Na in normal range and within 180 days    Sodium  Date Value Ref Range Status  07/02/2023 138 135 - 146 mmol/L Final         Failed - Cr in normal range and within 180 days    Creat  Date Value Ref Range Status  07/02/2023 0.67 0.50 - 0.99 mg/dL Final         Failed - Cl in normal range and within 180 days    Chloride  Date Value Ref Range Status  07/02/2023 104 98 - 110 mmol/L Final         Failed - Mg Level in normal range and within 180 days    No results found for: MG       Passed - Last BP in normal range    BP Readings from Last 1 Encounters:  11/19/23 127/85         Passed - Valid encounter within last 6 months    Recent Outpatient Visits           3 months ago Lower extremity edema   [facility] [place], FNP               hydrochlorothiazide  (HYDRODIURIL ) 25 MG tablet [Pharmacy Med Name: HYDROCHLOROTHIAZIDE  25 MG TAB] 90 tablet 0    Sig: TAKE 1 TABLET (25 MG TOTAL) BY MOUTH DAILY.     Cardiovascular: Diuretics - Thiazide Failed - 12/30/2023 10:40 AM      Failed - Cr in normal range and within 180 days    Creat  Date Value  Ref Range Status  07/02/2023 0.67 0.50 - 0.99 mg/dL Final         Failed - K in normal range and within 180 days    Potassium  Date Value Ref Range Status  07/02/2023 3.9 3.5 - 5.3 mmol/L Final         Failed - Na in normal range and within 180 days    Sodium  Date Value Ref Range Status  07/02/2023 138 135 - 146 mmol/L Final         Passed - Last BP in normal range    BP Readings from Last 1 Encounters:  11/19/23 127/85         Passed - Valid encounter within last 6 months    Recent Outpatient Visits           3 months ago Lower extremity edema  [facility], [place]              Signed Prescriptions Disp Refills   losartan  (COZAAR ) 25 MG tablet 90 tablet 0    Sig: TAKE 1 TABLET (25 MG TOTAL) BY MOUTH DAILY.     Cardiovascular:  Angiotensin Receptor Blockers Failed - 12/30/2023 10:40 AM      Failed - Cr in normal range and within 180 days    Creat  Date Value Ref Range Status  07/02/2023 0.67 0.50 - 0.99 mg/dL Final         Failed - K in normal range and within 180 days    Potassium  Date Value Ref Range Status  07/02/2023 3.9 3.5 - 5.3 mmol/L Final         Passed - Patient is not pregnant      Passed - Last BP in normal range    BP Readings from Last 1 Encounters:  11/19/23 127/85         Passed - Valid encounter within last 6 months    Recent Outpatient Visits           3 months ago Lower extremity edema   [facility] [place], [place]              Refused Prescriptions Disp Refills   traZODone  (DESYREL ) 50 MG tablet [Pharmacy Med Name: TRAZODONE  50 MG TABLET] 90 tablet 1    Sig: TAKE 0.5-1 TABLETS BY MOUTH AT BEDTIME AS NEEDED FOR SLEEP.     Psychiatry: Antidepressants - Serotonin Modulator Passed - 12/30/2023 10:40 AM      Passed - Valid encounter within last 6 months    Recent Outpatient Visits           3 months ago Lower extremity edema   [facility] [place], [place]

## 2023-12-30 NOTE — Telephone Encounter (Signed)
 Requested Prescriptions  Pending Prescriptions Disp Refills   furosemide  (LASIX ) 20 MG tablet [Pharmacy Med Name: FUROSEMIDE  20 MG TABLET] 90 tablet 0    Sig: TAKE 1 TABLET BY MOUTH EVERY DAY     Cardiovascular:  Diuretics - Loop Failed - 12/30/2023 10:39 AM      Failed - K in normal range and within 180 days    Potassium  Date Value Ref Range Status  07/02/2023 3.9 3.5 - 5.3 mmol/L Final         Failed - Ca in normal range and within 180 days    Calcium  Date Value Ref Range Status  07/02/2023 9.7 8.6 - 10.2 mg/dL Final         Failed - Na in normal range and within 180 days    Sodium  Date Value Ref Range Status  07/02/2023 138 135 - 146 mmol/L Final         Failed - Cr in normal range and within 180 days    Creat  Date Value Ref Range Status  07/02/2023 0.67 0.50 - 0.99 mg/dL Final         Failed - Cl in normal range and within 180 days    Chloride  Date Value Ref Range Status  07/02/2023 104 98 - 110 mmol/L Final         Failed - Mg Level in normal range and within 180 days    No results found for: MG       Passed - Last BP in normal range    BP Readings from Last 1 Encounters:  11/19/23 127/85         Passed - Valid encounter within last 6 months    Recent Outpatient Visits           3 months ago Lower extremity edema   [facility] [place], FNP               losartan  (COZAAR ) 25 MG tablet [Pharmacy Med Name: LOSARTAN  POTASSIUM 25 MG TAB] 90 tablet 0    Sig: TAKE 1 TABLET (25 MG TOTAL) BY MOUTH DAILY.     Cardiovascular:  Angiotensin Receptor Blockers Failed - 12/30/2023 10:39 AM      Failed - Cr in normal range and within 180 days    Creat  Date Value Ref Range Status  07/02/2023 0.67 0.50 - 0.99 mg/dL Final         Failed - K in normal range and within 180 days    Potassium  Date Value Ref Range Status  07/02/2023 3.9 3.5 - 5.3 mmol/L Final         Passed - Patient is not pregnant      Passed - Last  BP in normal range    BP Readings from Last 1 Encounters:  11/19/23 127/85         Passed - Valid encounter within last 6 months    Recent Outpatient Visits           3 months ago Lower extremity edema   [facility] [place], FNP               hydrochlorothiazide  (HYDRODIURIL ) 25 MG tablet [Pharmacy Med Name: HYDROCHLOROTHIAZIDE  25 MG TAB] 90 tablet 0    Sig: TAKE 1 TABLET (25 MG TOTAL) BY MOUTH DAILY.     Cardiovascular: Diuretics - Thiazide Failed - 12/30/2023 10:39 AM  Failed - Cr in normal range and within 180 days    Creat  Date Value Ref Range Status  07/02/2023 0.67 0.50 - 0.99 mg/dL Final         Failed - K in normal range and within 180 days    Potassium  Date Value Ref Range Status  07/02/2023 3.9 3.5 - 5.3 mmol/L Final         Failed - Na in normal range and within 180 days    Sodium  Date Value Ref Range Status  07/02/2023 138 135 - 146 mmol/L Final         Passed - Last BP in normal range    BP Readings from Last 1 Encounters:  11/19/23 127/85         Passed - Valid encounter within last 6 months    Recent Outpatient Visits           3 months ago Lower extremity edema   [facility] [place], [place]              Refused Prescriptions Disp Refills   traZODone  (DESYREL ) 50 MG tablet [Pharmacy Med Name: TRAZODONE  50 MG TABLET] 90 tablet 1    Sig: TAKE 0.5-1 TABLETS BY MOUTH AT BEDTIME AS NEEDED FOR SLEEP.     Psychiatry: Antidepressants - Serotonin Modulator Passed - 12/30/2023 10:39 AM      Passed - Valid encounter within last 6 months    Recent Outpatient Visits           3 months ago Lower extremity edema   [facility] [place], [place]

## 2024-01-13 ENCOUNTER — Ambulatory Visit (INDEPENDENT_AMBULATORY_CARE_PROVIDER_SITE_OTHER)

## 2024-01-13 ENCOUNTER — Ambulatory Visit
Admission: EM | Admit: 2024-01-13 | Discharge: 2024-01-13 | Disposition: A | Discharge status: Home / Self Care | Attending: Emergency Medicine | Admitting: Emergency Medicine

## 2024-01-13 DIAGNOSIS — M79674 Pain in right toe(s): Secondary | ICD-10-CM

## 2024-01-13 DIAGNOSIS — S90121A Contusion of right lesser toe(s) without damage to nail, initial encounter: Secondary | ICD-10-CM

## 2024-01-13 NOTE — ED Triage Notes (Signed)
 Right foot toe injury. Hit on fridge. Burning numbing sensation 4th toe. Happened last night.

## 2024-01-13 NOTE — Discharge Instructions (Addendum)
 Your x-rays did not show any broken bones.  I do believe you have bruised your toe is a result of your recent impact of the refrigerator.  Wear supportive shoes with a wide toebox and a supportive sole to help minimize movement of your toe and aid in pain relief.  You may also take over-the-counter Tylenol  and/or ibuprofen  [place] the package instructions.  Keep your right foot elevated is much as possible to help decrease swelling and aid in pain relief.  You may apply ice to your toe for 20 minutes at a time, 2-3 times a day, as needed for pain or inflammation.  Make sure you have a cloth between the ice and your skin so as to not cause skin damage.  If your symptoms do not improve either return for reevaluation, see your PCP, or follow-up with orthopedics such as [facility].

## 2024-01-13 NOTE — ED Provider Notes (Signed)
 [facility]    CSN: [id number] Arrival date & time: 01/13/24  9075      History   Chief Complaint Chief Complaint  Patient presents with   Toe Injury    HPI Danielle Romero is a 43 y.o. female.   HPI  43 year old female with past medical history significant for hypertension, hyperlipidemia, goiter, GERD, breast mass, and obesity presents for evaluation of pain in the fourth toe of her right foot.  She reports that she accidentally kicked her fridge last night.  She is describing a burning tingling sensation in the toe.  She does have range of motion but it causes pain.  There is swelling and redness at the DIP joint.  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   Family history of breast cancer 02/24/2022   Anemia of pregnancy 04/07/2018   Heartburn during pregnancy in second trimester 01/26/2018   History of cesarean delivery 10/06/2017   Breast mass, left    Abnormal mammogram of left breast     Past Surgical History:  Procedure Laterality Date   BREAST BIOPSY Left 08/13/2016   Procedure: left BREAST BIOPSY WITH NEEDLE LOCALIZATION left Moeller's gland excision;  Surgeon: Charlie FORBES Fell, MD;  Location: [facility];  Service: General;  Laterality: Left;   BREAST EXCISIONAL BIOPSY Left 08/13/2016   CESAREAN SECTION  2007   HYSTEROSCOPY WITH D & C N/A 12/26/2020   Procedure: DILATATION AND CURETTAGE /HYSTEROSCOPY;  Surgeon:  Lake Read, MD;  Location: [facility];  Service: Gynecology;  Laterality: N/A;   INTRAUTERINE DEVICE (IUD) INSERTION N/A 12/26/2020   Procedure: INTRAUTERINE DEVICE (IUD) INSERTION;  Surgeon: Lake Read, MD;  Location: [facility];  Service: Gynecology;  Laterality: N/A;   TUBAL LIGATION Bilateral 05/25/2018   Procedure: POST PARTUM TUBAL LIGATION;  Surgeon: Victor Claudell SAUNDERS, MD;  Location: [facility];  Service: Gynecology;  Laterality: Bilateral;    OB History     Gravida  6   Para  5   Term  5   Preterm      AB  1   Living  5      SAB      IAB      Ectopic      Multiple  0   Live Births  5            Home Medications    Prior to Admission medications   Medication Sig Start Date End Date Taking? Authorizing Provider  furosemide  (LASIX ) 20 MG tablet TAKE 1 TABLET BY MOUTH EVERY DAY 12/30/23  Yes Pender, Julie F, FNP  hydrochlorothiazide  (HYDRODIURIL ) 25 MG tablet TAKE 1 TABLET (25 MG TOTAL) BY MOUTH DAILY. 12/30/23  Yes Gareth Mliss FALCON, FNP  losartan  (COZAAR ) 25 MG tablet TAKE 1 TABLET (25 MG TOTAL) BY MOUTH DAILY. 12/30/23  Yes Gareth Mliss FALCON, FNP  Semaglutide -Weight Management (WEGOVY ) 0.25 MG/0.5ML SOAJ Inject 0.5  mg into the skin once a week. 07/08/23  Yes Gareth Mliss FALCON, FNP  valACYclovir  (VALTREX ) 500 MG tablet TAKE 1 TABLET (500 MG TOTAL) BY MOUTH 2 (TWO) TIMES DAILY. FOR 3 DAYS AS NEEDED SYMPTOMS 07/16/23   Gareth Mliss FALCON, FNP    Family History Family History  Problem Relation Age of Onset   Hypertension Mother    Breast cancer Mother 16   Kidney disease Mother    Cirrhosis Father    Alcohol abuse Father    Breast cancer Sister 69       dx at 49 and 106, in right + left breasts   Throat cancer Maternal Aunt    Colon cancer Paternal Uncle        dx over age 37   Colon cancer Cousin        dx under age 37    Social History Social History   Tobacco Use   Smoking status: Former    Current packs/day: 0.00    Average packs/day: 0.3  packs/day for 16.0 years (4.0 ttl pk-yrs)    Types: Cigarettes    Start date: 11/05/2003    Quit date: 11/05/2019    Years since quitting: 4.1   Smokeless tobacco: Never  Vaping Use   Vaping status: Every Day   Substances: Nicotine, Flavoring  Substance Use Topics   Alcohol use: Yes    Comment: Occasional   Drug use: Never     Allergies   Tape   Review of Systems Review of Systems  Musculoskeletal:  Positive for arthralgias and joint swelling.  Skin:  Positive for color change.  Neurological:  Positive for numbness.     Physical Exam Triage Vital Signs ED Triage Vitals  Encounter Vitals Group     BP      Girls Systolic BP Percentile      Girls Diastolic BP Percentile      Boys Systolic BP Percentile      Boys Diastolic BP Percentile      Pulse      Resp      Temp      Temp src      SpO2      Weight      Height      Head Circumference      Peak Flow      Pain Score      Pain Loc      Pain Education      Exclude from Growth Chart    No data found.  Updated Vital Signs BP (!) 135/95 (BP Location: Left Arm)   Pulse 82   Temp 98.6 F (37 C) (Oral)   Resp 16   SpO2 100%   Visual Acuity Right Eye Distance:   Left Eye Distance:   Bilateral Distance:    Right Eye Near:   Left Eye Near:    Bilateral Near:     Physical Exam Vitals and nursing note reviewed.  Constitutional:      Appearance: Normal appearance. She is not ill-appearing.  HENT:     Head: Normocephalic and atraumatic.  Musculoskeletal:        General: Swelling, tenderness and signs of injury present. No deformity.  Skin:    General: Skin is warm and dry.     Capillary Refill: Capillary refill takes less than 2 seconds.     Findings: Erythema present.  Neurological:     General: No focal deficit present.     Mental Status: She  is alert and oriented to person, place, and time.      UC Treatments / Results  Labs (all labs ordered are listed, but only abnormal results are  displayed) Labs Reviewed - No data to display  EKG   Radiology DG Toe 4th Right Result Date: 01/13/2024 CLINICAL DATA:  Pain at the distal interphalangeal joint.  Trauma EXAM: RIGHT FOURTH TOE three views COMPARISON:  None Available. FINDINGS: No fracture or dislocation. Preserved joint spaces and bone mineralization IMPRESSION: No acute osseous abnormality. Electronically Signed   By: Ranell Bring M.D.   On: 01/13/2024 10:07    Procedures Procedures (including critical care time)  Medications Ordered in UC Medications - No data to display  Initial Impression / Assessment and Plan / UC Course  I have reviewed the triage vital signs and the nursing notes.  Pertinent labs & imaging results that were available during my care of the patient were reviewed by me and considered in my medical decision making (see chart for details).   Patient is a nontoxic-appearing 43 year old female presenting for evaluation of right fourth toe pain as outlined in HPI above.  As you can see noted above, there is some erythema forming at the DIP joint, along with some edema.  She is tender with palpation of the DIP joint as well as with palpation of the middle phalanx.  Proximal phalanx and MTP joint are mildly tender to palpation.  Distal phalanx is unremarkable.  Patient has sensation in her toe and her cap refills less than 2 seconds.  I will obtain a radiograph of the right fourth toe to evaluate for bony abnormality.  Right fourth toe x-rays independently reviewed and evaluated by me.  Impression: No evidence of fracture or dislocation.  Mild soft tissue swelling is present.  Radiology overread is pending. Radiology impression states no acute osseous abnormality.  I will discharge patient on the diagnosis of contusion of her right fourth toe.  She may use over-the-counter Tylenol  and/or ibuprofen  as needed for pain.  Ice and elevation as well.  Return precautions reviewed.   Final Clinical Impressions(s)  / UC Diagnoses   Final diagnoses:  Pain of toe of right foot  Contusion of fourth toe of right foot, initial encounter     Discharge Instructions      Your x-rays did not show any broken bones.  I do believe you have bruised your toe is a result of your recent impact of the refrigerator.  Wear supportive shoes with a wide toebox and a supportive sole to help minimize movement of your toe and aid in pain relief.  You may also take over-the-counter Tylenol  and/or ibuprofen  [place]orda the package instructions.  Keep your right foot elevated is much as possible to help decrease swelling and aid in pain relief.  You may apply ice to your toe for 20 minutes at a time, 2-3 times a day, as needed for pain or inflammation.  Make sure you have a cloth between the ice and your skin so as to not cause skin damage.  If your symptoms do not improve either return for reevaluation, see your PCP, or follow-up with orthopedics such as [facility].     ED Prescriptions   None    PDMP not reviewed this encounter.   Bernardino Ditch, NP 01/13/24 1025

## 2024-05-02 ENCOUNTER — Encounter: Payer: Self-pay | Admitting: Nurse Practitioner

## 2024-05-02 ENCOUNTER — Other Ambulatory Visit: Payer: Self-pay

## 2024-05-02 DIAGNOSIS — Z1231 Encounter for screening mammogram for malignant neoplasm of breast: Secondary | ICD-10-CM

## 2024-06-01 ENCOUNTER — Ambulatory Visit

## 2024-06-16 ENCOUNTER — Ambulatory Visit
Admission: RE | Admit: 2024-06-16 | Discharge: 2024-06-16 | Disposition: A | Discharge status: Home / Self Care | Source: Ambulatory Visit | Attending: Family Medicine

## 2024-06-16 VITALS — BP 154/103 | HR 92 | Temp 98.5°F | Resp 14 | Ht 67.0 in | Wt 244.5 lb

## 2024-06-16 DIAGNOSIS — M549 Dorsalgia, unspecified: Secondary | ICD-10-CM | POA: Diagnosis not present

## 2024-06-16 DIAGNOSIS — M5441 Lumbago with sciatica, right side: Secondary | ICD-10-CM | POA: Diagnosis not present

## 2024-06-16 DIAGNOSIS — N898 Other specified noninflammatory disorders of vagina: Secondary | ICD-10-CM

## 2024-06-16 DIAGNOSIS — R300 Dysuria: Secondary | ICD-10-CM

## 2024-06-16 LAB — POCT URINE DIPSTICK
Bilirubin, UA: NEGATIVE
Glucose, UA: NEGATIVE mg/dL
Ketones, POC UA: NEGATIVE mg/dL
Leukocytes, UA: NEGATIVE
Nitrite, UA: NEGATIVE
Protein Ur, POC: NEGATIVE mg/dL
Spec Grav, UA: 1.02 (ref 1.010–1.025)
Urobilinogen, UA: 0.2 U/dL
pH, UA: 7 (ref 5.0–8.0)

## 2024-06-16 LAB — POCT URINE PREGNANCY: Preg Test, Ur: NEGATIVE

## 2024-06-16 MED ORDER — METHYLPREDNISOLONE 4 MG PO TBPK: ORAL_TABLET | ORAL | 0 refills | Status: DC

## 2024-06-16 MED ORDER — METHOCARBAMOL 500 MG PO TABS: 500.0000 mg | ORAL_TABLET | Freq: Two times a day (BID) | ORAL | 0 refills | Status: DC | PRN

## 2024-06-16 MED ORDER — KETOROLAC TROMETHAMINE 30 MG/ML IJ SOLN
30.0000 mg | Freq: Once | INTRAMUSCULAR | Status: AC
  Administered 2024-06-16: 30 mg via INTRAMUSCULAR

## 2024-06-16 NOTE — ED Triage Notes (Signed)
 Patient c/o back pain and lower abdominal itching and dysuria that started yesterday.  Patient denies vaginal discharge.

## 2024-06-16 NOTE — ED Provider Notes (Signed)
 [facility]    CSN: [id number] Arrival date & time: 06/16/24  1155      History   Chief Complaint Chief Complaint  Patient presents with   Back Pain    I believe I have a bladder infection. - Entered by patient    HPI Danielle Romero is a 43 y.o. female presents for back pain.  Patient reports yesterday she developed a right lower back pain that has been constant and does radiate into her right leg.  Denies any numbness/tingling/weakness of her lower extremities, no bowel or bladder incontinence, no saddle paresthesia.  No known injury or inciting event.  She does endorse some urinary burning at the end of her urine stream that started yesterday.  No hematuria, malodorous urine, urgency frequency.  No fevers vomiting or flank pain.  Does also endorse some clear vaginal discharge that is not malodorous or pruritic.  No known STD exposure or exposure.  She has not taken any OTC treatments for symptoms.  No other concerns at this time.   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   Family history of breast cancer 02/24/2022   Anemia of pregnancy 04/07/2018   Heartburn during pregnancy in second trimester 01/26/2018   History of cesarean delivery 10/06/2017   Breast mass, left    Abnormal mammogram of left breast     Past Surgical History:  Procedure Laterality Date   BREAST BIOPSY Left 08/13/2016    Procedure: left BREAST BIOPSY WITH NEEDLE LOCALIZATION left Moeller's gland excision;  Surgeon: Charlie FORBES Fell, MD;  Location: [facility];  Service: General;  Laterality: Left;   BREAST EXCISIONAL BIOPSY Left 08/13/2016   CESAREAN SECTION  2007   HYSTEROSCOPY WITH D & C N/A 12/26/2020   Procedure: DILATATION AND CURETTAGE /HYSTEROSCOPY;  Surgeon: Lake Read, MD;  Location: [facility];  Service: Gynecology;  Laterality: N/A;   INTRAUTERINE DEVICE (IUD) INSERTION N/A 12/26/2020   Procedure: INTRAUTERINE DEVICE (IUD) INSERTION;  Surgeon: Lake Read, MD;  Location: [facility];  Service: Gynecology;  Laterality: N/A;   TUBAL LIGATION Bilateral 05/25/2018   Procedure: POST PARTUM TUBAL LIGATION;  Surgeon: Victor Claudell SAUNDERS, MD;  Location: [facility];  Service: Gynecology;  Laterality: Bilateral;    OB History     Gravida  6   Para  5   Term  5   Preterm      AB  1   Living  5      SAB      IAB      Ectopic      Multiple  0   Live Births  5            Home Medications    Prior to Admission medications   Medication Sig Start Date End Date Taking? Authorizing Provider  furosemide  (LASIX ) 20 MG tablet TAKE 1  TABLET BY MOUTH EVERY DAY 12/30/23  Yes Pender, Julie F, FNP  hydrochlorothiazide  (HYDRODIURIL ) 25 MG tablet TAKE 1 TABLET (25 MG TOTAL) BY MOUTH DAILY. 12/30/23  Yes Gareth Mliss FALCON, FNP  losartan  (COZAAR ) 25 MG tablet TAKE 1 TABLET (25 MG TOTAL) BY MOUTH DAILY. 12/30/23  Yes Gareth Mliss FALCON, FNP  methocarbamol  (ROBAXIN ) 500 MG tablet Take 1 tablet (500 mg total) by mouth 2 (two) times daily as needed for muscle spasms. 06/16/24  Yes Kaithlyn Teagle, Jodi R, NP  methylPREDNISolone  (MEDROL  DOSEPAK) 4 MG TBPK tablet Take as prescribed on package 06/16/24  Yes Crue Otero, Jodi R, NP  valACYclovir  (VALTREX ) 500 MG tablet TAKE 1 TABLET (500 MG TOTAL) BY MOUTH 2 (TWO) TIMES DAILY. FOR 3 DAYS AS NEEDED SYMPTOMS 07/16/23  Yes Pender, Julie F, FNP  Semaglutide -Weight Management  (WEGOVY ) 0.25 MG/0.5ML SOAJ Inject 0.5 mg into the skin once a week. 07/08/23   Gareth Mliss FALCON, FNP    Family History Family History  Problem Relation Age of Onset   Hypertension Mother    Breast cancer Mother 15   Kidney disease Mother    Cirrhosis Father    Alcohol abuse Father    Breast cancer Sister 18       dx at 29 and 35, in right + left breasts   Throat cancer Maternal Aunt    Colon cancer Paternal Uncle        dx over age 3   Colon cancer Cousin        dx under age 24    Social History Social History   Tobacco Use   Smoking status: Former    Current packs/day: 0.00    Average packs/day: 0.3 packs/day for 16.0 years (4.0 ttl pk-yrs)    Types: Cigarettes    Start date: 11/05/2003    Quit date: 11/05/2019    Years since quitting: 4.6   Smokeless tobacco: Never  Vaping Use   Vaping status: Every Day   Substances: Nicotine, Flavoring  Substance Use Topics   Alcohol use: Yes    Comment: Occasional   Drug use: Never     Allergies   Tape   Review of Systems Review of Systems  Genitourinary:  Positive for dysuria and vaginal discharge.  Musculoskeletal:  Positive for back pain.     Physical Exam Triage Vital Signs ED Triage Vitals  Encounter Vitals Group     BP 06/16/24 1221 (!) 154/103     Girls Systolic BP Percentile --      Girls Diastolic BP Percentile --      Boys Systolic BP Percentile --      Boys Diastolic BP Percentile --      Pulse Rate 06/16/24 1221 92     Resp 06/16/24 1221 14     Temp 06/16/24 1221 98.5 F (36.9 C)     Temp Source 06/16/24 1221 Oral     SpO2 06/16/24 1221 97 %     Weight 06/16/24 1219 244 lb 7.8 oz (110.9 kg)     Height 06/16/24 1219 5' 7 (1.702 m)     Head Circumference --      Peak Flow --      Pain Score 06/16/24 1219 8     Pain Loc --      Pain Education --      Exclude from Growth Chart --    No data found.  Updated Vital Signs BP (!) 154/103 (BP Location: Right Arm)   Pulse 92  Temp 98.5 F (36.9  C) (Oral)   Resp 14   Ht 5' 7 (1.702 m)   Wt 244 lb 7.8 oz (110.9 kg)   SpO2 97%   BMI 38.29 kg/m   Visual Acuity Right Eye Distance:   Left Eye Distance:   Bilateral Distance:    Right Eye Near:   Left Eye Near:    Bilateral Near:     Physical Exam Vitals and nursing note reviewed.  Constitutional:      General: She is not in acute distress.    Appearance: Normal appearance. She is obese. She is not ill-appearing.  HENT:     Head: Normocephalic and atraumatic.  Eyes:     Pupils: Pupils are equal, round, and reactive to light.  Cardiovascular:     Rate and Rhythm: Normal rate.  Pulmonary:     Effort: Pulmonary effort is normal.  Abdominal:     Tenderness: There is no right CVA tenderness or left CVA tenderness.  Musculoskeletal:     Thoracic back: Normal.     Lumbar back: Tenderness present. No swelling, edema, deformity, signs of trauma, lacerations, spasms or bony tenderness. Normal range of motion. Positive right straight leg raise test. Negative left straight leg raise test. No scoliosis.       Back:     Comments: strength 5 out of 5 bilateral lower extremities  Skin:    General: Skin is warm and dry.  Neurological:     General: No focal deficit present.     Mental Status: She is alert and oriented to person, place, and time.  Psychiatric:        Mood and Affect: Mood normal.        Behavior: Behavior normal.      UC Treatments / Results  Labs (all labs ordered are listed, but only abnormal results are displayed) Labs Reviewed  POCT URINE DIPSTICK - Abnormal; Notable for the following components:      Result Value   Clarity, UA cloudy (*)    Blood, UA small (*)    All other components within normal limits  POCT URINE PREGNANCY - Normal  URINE CULTURE  CERVICOVAGINAL ANCILLARY ONLY    EKG   Radiology No results found.  Procedures Procedures (including critical care time)  Medications Ordered in UC Medications  ketorolac  (TORADOL ) 30  MG/ML injection 30 mg (has no administration in time range)    Initial Impression / Assessment and Plan / UC Course  I have reviewed the triage vital signs and the nursing notes.  Pertinent labs & imaging results that were available during my care of the patient were reviewed by me and considered in my medical decision making (see chart for details).     Reviewed exam and symptoms with patient.  UA with trace blood otherwise no signs of UTI.  Urine hCG negative.  Will send culture given dysuria.  Patient requested vaginal testing for BV and yeast and will contact for any positive results.  Declined any STD testing.  Discussed exam consistent with musculoskeletal cause of pain.  Patient given Toradol  injection in clinic.  Monitored for 10 minutes after injection with no reaction noted and tolerated well.  Instructed no NSAIDs for 24 hours and verbalized understanding.  Will do trial of Robaxin  and Medrol  Dosepak for sciatica.  Encouraged heat rest and PCP follow-up if symptoms do not improve.  ER precautions reviewed. Final Clinical Impressions(s) / UC Diagnoses   Final diagnoses:  Back pain, unspecified  back location, unspecified back pain laterality, unspecified chronicity  Dysuria  Acute right-sided low back pain with right-sided sciatica  Vaginal discharge     Discharge Instructions      You were given a Toradol  injection in clinic today. Do not take any over the counter NSAID's such as Advil , ibuprofen , Aleve, or naproxen for 24 hours. You may take tylenol  if needed The clinical contact you with results of the urine culture and vaginal swab done today if positive. You may take Robaxin  twice daily as needed.  Please note this medication make you drowsy.  Do not drink alcohol or drive while on this medication.  Medrol  Dosepak as prescribed.  Heat to the low back and lots of rest.  Please follow-up with your PCP if your symptoms do not improve.  Please go to the ER for any worsening  symptoms.  Hope you feel better soon!      ED Prescriptions     Medication Sig Dispense Auth. Provider   methocarbamol  (ROBAXIN ) 500 MG tablet Take 1 tablet (500 mg total) by mouth 2 (two) times daily as needed for muscle spasms. 10 tablet Jone Panebianco, Jodi R, NP   methylPREDNISolone  (MEDROL  DOSEPAK) 4 MG TBPK tablet Take as prescribed on package 21 tablet Nemesis Rainwater, Jodi R, NP      PDMP not reviewed this encounter.   Loreda Myla SAUNDERS, NP 06/16/24 1250

## 2024-06-16 NOTE — Discharge Instructions (Addendum)
 You were given a Toradol  injection in clinic today. Do not take any over the counter NSAID's such as Advil , ibuprofen , Aleve, or naproxen for 24 hours. You may take tylenol  if needed The clinical contact you with results of the urine culture and vaginal swab done today if positive. You may take Robaxin  twice daily as needed.  Please note this medication make you drowsy.  Do not drink alcohol or drive while on this medication.  Medrol  Dosepak as prescribed.  Heat to the low back and lots of rest.  Please follow-up with your PCP if your symptoms do not improve.  Please go to the ER for any worsening symptoms.  Hope you feel better soon!

## 2024-06-17 LAB — URINE CULTURE

## 2024-06-19 ENCOUNTER — Ambulatory Visit (HOSPITAL_COMMUNITY): Payer: Self-pay

## 2024-06-19 LAB — CERVICOVAGINAL ANCILLARY ONLY
Bacterial Vaginitis (gardnerella): NEGATIVE
Candida Glabrata: NEGATIVE
Candida Vaginitis: NEGATIVE
Comment: NEGATIVE
Comment: NEGATIVE
Comment: NEGATIVE
Comment: NEGATIVE
Trichomonas: NEGATIVE

## 2024-06-20 ENCOUNTER — Encounter: Payer: Self-pay | Admitting: Nurse Practitioner

## 2024-06-20 ENCOUNTER — Ambulatory Visit: Payer: Self-pay

## 2024-06-20 NOTE — Telephone Encounter (Signed)
 FYI Only or Action Required?: FYI only for provider: appointment scheduled on 06/21/24.  Patient was last seen in primary care on 09/30/2023 by Gareth Mliss FALCON, FNP.  Called Nurse Triage reporting Back Pain.  Symptoms began several days ago.  Interventions attempted: Prescription medications: Robaxin ,  Medrol  Dosepak.  Symptoms are: unchanged.  Triage Disposition: See PCP When Office is Open (Within 3 Days)  Patient/caregiver understands and will follow disposition?: Yes Reason for Disposition  [1] MODERATE back pain (e.g., interferes with normal activities) AND [2] present > 3 days  Answer Assessment - Initial Assessment Questions Went to UC 12/5, given Toradol , no relief. trial of Robaxin  and Medrol  Dosepak for sciatica.  Encouraged heat rest and PCP follow-up if symptoms do not improve Patient reports this being new pain, only experienced something similar when pregnant, as baby was sitting on a nerve.  1. ONSET: When did the pain begin? (e.g., minutes, hours, days)     5 days ago  2. LOCATION: Where does it hurt? (upper, mid or lower back)     Across lower back, dominant on right side  3. SEVERITY: How bad is the pain?  (e.g., Scale 1-10; mild, moderate, or severe)     Spasms that shoot pain up spine  4. PATTERN: Is the pain constant? (e.g., yes, no; constant, intermittent)      Constant  5. RADIATION: Does the pain shoot into your legs or somewhere else?     Right leg  6. CAUSE:  What do you think is causing the back pain?      Unsure  7. BACK OVERUSE:  Any recent lifting of heavy objects, strenuous work or exercise?     Denies  8. NEUROLOGIC SYMPTOMS: Do you have any weakness, numbness, or problems with bowel/bladder control?     Denies  9. OTHER SYMPTOMS: Do you have any other symptoms? (e.g., fever, abdomen pain, burning with urination, blood in urine)       Denies  Protocols used: Back Pain-A-AH  Copied from CRM #[id number]. Topic: Clinical -  Red Word Triage >> Jun 20, 2024  9:40 AM Larissa RAMAN wrote: [facility] that prompted transfer to Nurse Triage: back pain

## 2024-06-21 ENCOUNTER — Ambulatory Visit: Admitting: Nurse Practitioner

## 2024-06-21 ENCOUNTER — Encounter: Payer: Self-pay | Admitting: Nurse Practitioner

## 2024-06-21 VITALS — BP 138/88 | HR 90 | Temp 98.0°F | Ht 67.0 in | Wt 238.0 lb

## 2024-06-21 DIAGNOSIS — M5442 Lumbago with sciatica, left side: Secondary | ICD-10-CM | POA: Diagnosis not present

## 2024-06-21 DIAGNOSIS — M5441 Lumbago with sciatica, right side: Secondary | ICD-10-CM

## 2024-06-21 MED ORDER — BACLOFEN 5 MG PO TABS: 5.0000 mg | ORAL_TABLET | Freq: Three times a day (TID) | ORAL | 0 refills | Status: AC | PRN

## 2024-06-21 MED ORDER — PREDNISONE 10 MG (21) PO TBPK: ORAL_TABLET | ORAL | 0 refills | Status: AC

## 2024-06-21 MED ORDER — CELECOXIB 100 MG PO CAPS: 100.0000 mg | ORAL_CAPSULE | Freq: Two times a day (BID) | ORAL | 0 refills | Status: DC

## 2024-06-21 NOTE — Progress Notes (Signed)
 BP 138/88   Pulse 90   Temp 98 F (36.7 C)   Ht 5' 7 (1.702 m)   Wt 238 lb (108 kg)   SpO2 98%   BMI 37.28 kg/m    Subjective:    Patient ID: Danielle Romero, female    DOB: Jan 19, 1981, 43 y.o.   MRN: [id number]  HPI: [name] is a 43 y.o. female  Chief Complaint  Patient presents with   Back Pain    Pt c/o low back pain x1 week. States px radiates down legs. UTI ruled out at recent UC visit   Discussed the use of AI scribe software for clinical note transcription with the patient, who gave verbal consent to proceed.  History of Present Illness DEIJA Romero is a 43 year old female who presents with acute lower back pain radiating down her legs.  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 - No numbness, tingling, or incontinence  Response to analgesic and muscle relaxant therapy - Completed a course of Medrol  Dosepak and Robaxine without relief - Taking 800 mg ibuprofen , not effective in alleviating pain  Urinary symptoms - History of sharp pain at the end of urination prior to onset of current back pain - No current urinary symptoms associated with back pain         07/02/2023    8:14 AM 04/02/2023    7:42 AM 03/05/2023    8:14 AM  Depression screen PHQ 2/9  Decreased Interest 0 1 0  Down, Depressed, Hopeless 0 1 0  PHQ - 2 Score 0 2 0  Altered sleeping 1 2   Tired, decreased energy 1 2   Change in appetite 0 0   Feeling bad or failure about yourself  0 2   Trouble concentrating 0 0   Moving slowly or  fidgety/restless 0 0   Suicidal thoughts 0 0   PHQ-9 Score 2  8    Difficult doing work/chores Not difficult at all Not difficult at all      Data saved with a previous flowsheet row definition    Relevant past medical, surgical, family and social history reviewed and updated as indicated. Interim medical history since our last visit reviewed. Allergies and medications reviewed and updated.  Review of Systems  Ten systems reviewed and is negative except as mentioned in HPI      Objective:      BP 138/88   Pulse 90   Temp 98 F (36.7 C)   Ht 5' 7 (1.702 m)   Wt 238 lb (108 kg)   SpO2 98%   BMI 37.28 kg/m    Wt Readings from Last 3 Encounters:  06/21/24 238 lb (108 kg)  06/16/24 244 lb 7.8 oz (110.9 kg)  11/19/23 244 lb 9.6 oz (110.9 kg)    Physical Exam GENERAL: Alert, cooperative, well developed, no acute distress. HEENT: Normocephalic, normal oropharynx, moist mucous membranes. CHEST: Clear to auscultation bilaterally, no wheezes, rhonchi, or crackles. CARDIOVASCULAR: Normal heart rate and rhythm, S1 and S2 normal without murmurs. ABDOMEN:  Soft, non-tender, non-distended, without organomegaly, normal bowel sounds. EXTREMITIES: No cyanosis or edema. MUSCULOSKELETAL: Tenderness in the lower back. No midline tenderness NEUROLOGICAL: Cranial nerves grossly intact, moves all extremities without gross motor or sensory deficit.  Results for orders placed or performed during the hospital encounter of 06/16/24  POC Urinalysis Dipstick   Collection Time: 06/16/24 12:31 PM  Result Value Ref Range   Color, UA yellow yellow   Clarity, UA cloudy (A) clear   Glucose, UA negative negative mg/dL   Bilirubin, UA negative negative   Ketones, POC UA negative negative mg/dL   Spec Grav, UA 8.979 8.989 - 1.025   Blood, UA small (A) negative   pH, UA 7.0 5.0 - 8.0   Protein Ur, POC negative negative mg/dL   Urobilinogen, UA 0.2 0.2 or 1.0 E.U./dL   Nitrite, UA Negative Negative    Leukocytes, UA Negative Negative  POCT urine pregnancy   Collection Time: 06/16/24 12:33 PM  Result Value Ref Range   Preg Test, Ur Negative Negative  Urine Culture   Collection Time: 06/16/24 12:53 PM   Specimen: Urine, Clean Catch  Result Value Ref Range   Specimen Description      URINE, CLEAN CATCH Performed at [facility], [address], [place], [place] [address]    Special Requests      NONE Performed at [facility], [address], [place], [place] [address]    Culture MULTIPLE SPECIES PRESENT, SUGGEST RECOLLECTION (A)    Report Status 06/17/2024 FINAL   Cervicovaginal ancillary only   Collection Time: 06/16/24 12:57 PM  Result Value Ref Range   Trichomonas Negative    Bacterial Vaginitis (gardnerella) Negative    Candida Vaginitis Negative    Candida Glabrata Negative    Comment      Normal Reference Range Bacterial Vaginosis - Negative   Comment Normal Reference Range Candida Species - Negative    Comment Normal Reference Range Candida Galbrata - Negative    Comment Normal Reference Range Trichomonas - Negative           Assessment & Plan:   Problem List Items Addressed This Visit   None Visit Diagnoses       Acute bilateral low back pain with bilateral sciatica    -  Primary   Relevant Medications   celecoxib (CELEBREX) 100 MG capsule   predniSONE  (STERAPRED UNI-PAK 21 TAB) 10 MG (21) TBPK tablet   Baclofen 5 MG TABS   Other Relevant Orders   Ambulatory referral to Physical Therapy        Assessment and Plan Assessment & Plan Acute low back pain with bilateral sciatica Acute low back pain with radiation down both legs, exacerbated by cold weather. Pain is severe, described as stabbing, and worsens with certain movements. No numbness, tingling, or incontinence reported. Previous treatment with Medrol  Dosepak and Robaxone. Differential diagnosis includes musculoskeletal pain versus kidney stones, but presentation is more  consistent with musculoskeletal origin. Imaging not indicated until pain persists for six weeks. Discussed red flags such as bowel or bladder incontinence, which would necessitate emergency evaluation. - Prescribed Celebrex for 10 days, then discontinue if pain resolves. - Prescribed baclofen as a muscle relaxant. - Prescribed prednisone  as a steroid. - Referred to physical therapy. - Advised against taking other NSAIDs while on Celebrex. - Recommended Tylenol  for breakthrough pain. - Suggested Voltaren gel or Salonpas patches for topical pain relief. - Advised on heat therapy and stretching exercises. - Instructed to  monitor for red flags such as bowel or bladder incontinence, weakness or numbness and seek emergency care if these occur.        Follow up plan: Return if symptoms worsen or fail to improve.

## 2024-06-21 NOTE — Patient Instructions (Addendum)
 Voltaren gel   [facility] [address] [phone]

## 2024-07-13 ENCOUNTER — Other Ambulatory Visit: Payer: Self-pay | Admitting: Nurse Practitioner

## 2024-07-13 DIAGNOSIS — M5442 Lumbago with sciatica, left side: Secondary | ICD-10-CM

## 2024-07-14 NOTE — Telephone Encounter (Signed)
 Requested medications are due for refill today.  yes  Requested medications are on the active medications list.  yes  Last refill. 06/21/2024   Future visit scheduled.   no  Notes to clinic.  Labs are expired. Pt needs an appt. Rx written to expire 07/21/2024.    Requested Prescriptions  Pending Prescriptions Disp Refills   Baclofen  5 MG TABS [Pharmacy Med Name: BACLOFEN  5 MG TABLET] 270 tablet 1    Sig: Take 1 tablet (5 mg total) by mouth 3 (three) times daily as needed (msk pain or spasms).     Analgesics:  Muscle Relaxants - baclofen  Failed - 07/14/2024 12:49 PM      Failed - Cr in normal range and within 180 days    Creat  Date Value Ref Range Status  07/02/2023 0.67 0.50 - 0.99 mg/dL Final         Failed - eGFR is 30 or above and within 180 days    GFR calc Af Amer  Date Value Ref Range Status  04/19/2018 >60 >60 mL/min Final    Comment:    (NOTE) The eGFR has been calculated using the CKD EPI equation. This calculation has not been validated in all clinical situations. eGFR's persistently <60 mL/min signify possible Chronic Kidney Disease.    GFR calc non Af Amer  Date Value Ref Range Status  04/19/2018 >60 >60 mL/min Final   eGFR  Date Value Ref Range Status  07/02/2023 111 > OR = 60 mL/min/1.3m2 Final         Failed - Valid encounter within last 6 months    Recent Outpatient Visits           3 weeks ago Acute bilateral low back pain with bilateral sciatica   [facility] Gareth Mliss [place], FNP   9 months ago Lower extremity edema   [facility] Gareth Mliss [place], [place]

## 2024-07-18 ENCOUNTER — Other Ambulatory Visit: Payer: Self-pay | Admitting: Nurse Practitioner

## 2024-07-18 DIAGNOSIS — M5442 Lumbago with sciatica, left side: Secondary | ICD-10-CM

## 2024-07-19 NOTE — Telephone Encounter (Signed)
 Requested medication (s) are due for refill today: yes  Requested medication (s) are on the active medication list: yes  Last refill:  06/21/24  Future visit scheduled: no  Notes to clinic:  Unable to refill per protocol due to failed labs, no updated results.      Requested Prescriptions  Pending Prescriptions Disp Refills   celecoxib  (CELEBREX ) 100 MG capsule [Pharmacy Med Name: CELECOXIB  100 MG CAPSULE] 60 capsule 0    Sig: TAKE 1 CAPSULE BY MOUTH TWICE A DAY     Analgesics:  COX2 Inhibitors Failed - 07/19/2024 12:02 PM      Failed - Manual Review: Labs are only required if the patient has taken medication for more than 8 weeks.      Failed - HGB in normal range and within 360 days    Hemoglobin  Date Value Ref Range Status  07/02/2023 12.7 11.7 - 15.5 g/dL Final  90/88/7980 9.9 (L) 11.1 - 15.9 g/dL Final         Failed - Cr in normal range and within 360 days    Creat  Date Value Ref Range Status  07/02/2023 0.67 0.50 - 0.99 mg/dL Final         Failed - HCT in normal range and within 360 days    HCT  Date Value Ref Range Status  07/02/2023 37.8 35.0 - 45.0 % Final   Hematocrit  Date Value Ref Range Status  02/23/2018 29.1 (L) 34.0 - 46.6 % Final         Failed - AST in normal range and within 360 days    AST  Date Value Ref Range Status  07/02/2023 11 10 - 30 U/L Final         Failed - ALT in normal range and within 360 days    ALT  Date Value Ref Range Status  07/02/2023 13 6 - 29 U/L Final         Failed - eGFR is 30 or above and within 360 days    GFR calc Af Amer  Date Value Ref Range Status  04/19/2018 >60 >60 mL/min Final    Comment:    (NOTE) The eGFR has been calculated using the CKD EPI equation. This calculation has not been validated in all clinical situations. eGFR's persistently <60 mL/min signify possible Chronic Kidney Disease.    GFR calc non Af Amer  Date Value Ref Range Status  04/19/2018 >60 >60 mL/min Final   eGFR  Date  Value Ref Range Status  07/02/2023 111 > OR = 60 mL/min/1.14m2 Final         Passed - Patient is not pregnant      Passed - Valid encounter within last 12 months    Recent Outpatient Visits           4 weeks ago Acute bilateral low back pain with bilateral sciatica   [facility] Gareth Mliss [place], FNP   9 months ago Lower extremity edema   [facility] Gareth Mliss [place], [place]

## 2024-07-20 ENCOUNTER — Encounter: Payer: Self-pay | Admitting: Nurse Practitioner
